# Patient Record
Sex: Male | Born: 1940 | Race: White | Hispanic: No | Marital: Married | Smoking: Former smoker
Health system: Southern US, Community
[De-identification: ages and names within clinical notes are randomized; demographics above are authoritative.]

## PROBLEM LIST (undated history)

## (undated) DIAGNOSIS — I639 Cerebral infarction, unspecified: Secondary | ICD-10-CM

## (undated) DIAGNOSIS — C439 Malignant melanoma of skin, unspecified: Secondary | ICD-10-CM

## (undated) DIAGNOSIS — G4730 Sleep apnea, unspecified: Secondary | ICD-10-CM

## (undated) DIAGNOSIS — E119 Type 2 diabetes mellitus without complications: Secondary | ICD-10-CM

## (undated) DIAGNOSIS — R519 Headache, unspecified: Secondary | ICD-10-CM

## (undated) DIAGNOSIS — R0600 Dyspnea, unspecified: Secondary | ICD-10-CM

## (undated) DIAGNOSIS — G8929 Other chronic pain: Secondary | ICD-10-CM

## (undated) DIAGNOSIS — L509 Urticaria, unspecified: Secondary | ICD-10-CM

## (undated) DIAGNOSIS — K219 Gastro-esophageal reflux disease without esophagitis: Secondary | ICD-10-CM

## (undated) DIAGNOSIS — D126 Benign neoplasm of colon, unspecified: Secondary | ICD-10-CM

## (undated) DIAGNOSIS — Z87442 Personal history of urinary calculi: Secondary | ICD-10-CM

## (undated) DIAGNOSIS — E785 Hyperlipidemia, unspecified: Secondary | ICD-10-CM

## (undated) DIAGNOSIS — R413 Other amnesia: Secondary | ICD-10-CM

## (undated) DIAGNOSIS — R51 Headache: Secondary | ICD-10-CM

## (undated) DIAGNOSIS — I2510 Atherosclerotic heart disease of native coronary artery without angina pectoris: Secondary | ICD-10-CM

## (undated) DIAGNOSIS — J449 Chronic obstructive pulmonary disease, unspecified: Secondary | ICD-10-CM

## (undated) DIAGNOSIS — K2270 Barrett's esophagus without dysplasia: Secondary | ICD-10-CM

## (undated) DIAGNOSIS — J398 Other specified diseases of upper respiratory tract: Secondary | ICD-10-CM

## (undated) DIAGNOSIS — I10 Essential (primary) hypertension: Secondary | ICD-10-CM

## (undated) DIAGNOSIS — R7303 Prediabetes: Secondary | ICD-10-CM

## (undated) DIAGNOSIS — Q631 Lobulated, fused and horseshoe kidney: Secondary | ICD-10-CM

## (undated) DIAGNOSIS — T8859XA Other complications of anesthesia, initial encounter: Secondary | ICD-10-CM

## (undated) DIAGNOSIS — K5732 Diverticulitis of large intestine without perforation or abscess without bleeding: Secondary | ICD-10-CM

## (undated) HISTORY — DX: Atherosclerotic heart disease of native coronary artery without angina pectoris: I25.10

## (undated) HISTORY — DX: Malignant melanoma of skin, unspecified: C43.9

## (undated) HISTORY — DX: Urticaria, unspecified: L50.9

## (undated) HISTORY — PX: CORONARY STENT PLACEMENT: SHX1402

## (undated) HISTORY — DX: Headache, unspecified: R51.9

## (undated) HISTORY — PX: CORONARY ARTERY BYPASS GRAFT: SHX141

## (undated) HISTORY — DX: Barrett's esophagus without dysplasia: K22.70

## (undated) HISTORY — DX: Other specified diseases of upper respiratory tract: J39.8

## (undated) HISTORY — DX: Hyperlipidemia, unspecified: E78.5

## (undated) HISTORY — DX: Chronic obstructive pulmonary disease, unspecified: J44.9

## (undated) HISTORY — PX: HERNIA REPAIR: SHX51

## (undated) HISTORY — PX: ADENOIDECTOMY: SUR15

## (undated) HISTORY — DX: Lobulated, fused and horseshoe kidney: Q63.1

## (undated) HISTORY — DX: Benign neoplasm of colon, unspecified: D12.6

## (undated) HISTORY — PX: UMBILICAL HERNIA REPAIR: SHX196

## (undated) HISTORY — DX: Other chronic pain: G89.29

## (undated) HISTORY — DX: Other amnesia: R41.3

## (undated) HISTORY — DX: Essential (primary) hypertension: I10

## (undated) HISTORY — PX: SINOSCOPY: SHX187

## (undated) HISTORY — DX: Type 2 diabetes mellitus without complications: E11.9

## (undated) HISTORY — DX: Gastro-esophageal reflux disease without esophagitis: K21.9

## (undated) HISTORY — PX: BRAIN SURGERY: SHX531

## (undated) HISTORY — DX: Personal history of urinary calculi: Z87.442

## (undated) HISTORY — PX: TONSILLECTOMY: SUR1361

## (undated) HISTORY — DX: Diverticulitis of large intestine without perforation or abscess without bleeding: K57.32

## (undated) HISTORY — DX: Headache: R51

---

## 2007-08-24 ENCOUNTER — Ambulatory Visit: Payer: Self-pay | Admitting: Internal Medicine

## 2007-09-08 ENCOUNTER — Encounter: Payer: Self-pay | Admitting: Internal Medicine

## 2007-09-08 ENCOUNTER — Ambulatory Visit: Payer: Self-pay | Admitting: Internal Medicine

## 2007-09-08 HISTORY — PX: COLONOSCOPY W/ BIOPSIES AND POLYPECTOMY: SHX1376

## 2007-09-15 ENCOUNTER — Ambulatory Visit: Payer: Self-pay | Admitting: Internal Medicine

## 2007-09-15 ENCOUNTER — Encounter: Payer: Self-pay | Admitting: Internal Medicine

## 2009-07-11 HISTORY — PX: CORONARY ARTERY BYPASS GRAFT: SHX141

## 2010-08-13 ENCOUNTER — Encounter (INDEPENDENT_AMBULATORY_CARE_PROVIDER_SITE_OTHER): Payer: Self-pay | Admitting: *Deleted

## 2010-08-24 ENCOUNTER — Encounter (INDEPENDENT_AMBULATORY_CARE_PROVIDER_SITE_OTHER): Payer: Self-pay | Admitting: *Deleted

## 2010-10-08 ENCOUNTER — Encounter (INDEPENDENT_AMBULATORY_CARE_PROVIDER_SITE_OTHER): Payer: Self-pay | Admitting: *Deleted

## 2010-10-08 ENCOUNTER — Ambulatory Visit: Payer: Self-pay | Admitting: Internal Medicine

## 2010-10-08 DIAGNOSIS — Z860100 Personal history of colon polyps, unspecified: Secondary | ICD-10-CM | POA: Insufficient documentation

## 2010-10-08 DIAGNOSIS — I259 Chronic ischemic heart disease, unspecified: Secondary | ICD-10-CM | POA: Insufficient documentation

## 2010-10-08 DIAGNOSIS — K2270 Barrett's esophagus without dysplasia: Secondary | ICD-10-CM | POA: Insufficient documentation

## 2010-10-12 ENCOUNTER — Ambulatory Visit: Payer: Self-pay | Admitting: Internal Medicine

## 2010-10-12 HISTORY — PX: UPPER GASTROINTESTINAL ENDOSCOPY: SHX188

## 2010-10-20 ENCOUNTER — Encounter: Payer: Self-pay | Admitting: Internal Medicine

## 2011-01-29 NOTE — Letter (Signed)
Summary: EGD Instructions  [facility]  [address] [place], [place] [address]   Phone: [phone]  Fax: [phone]       Angel Khan    04/11/1941    MRN: [id number]       Procedure Day [name]: [name], 10/11/10     Arrival Time: 3:00 PM     Procedure Time: 4:00 PM     Location of Procedure:                    _X_ [facility] (4th Floor)   PREPARATION FOR ENDOSCOPY   On THURSDAY, 10/11/10, THE DAY OF THE PROCEDURE:  1.   No solid foods, milk or milk products are allowed after midnight the night before your procedure.  2.   Do not drink anything colored red or purple.  Avoid juices with pulp.  No orange juice.  3.  You may drink clear liquids until2:00 PM, which is 2 hours before your procedure.                                                                                                CLEAR LIQUIDS INCLUDE: Water Jello Ice Popsicles Tea (sugar ok, no milk/cream) Powdered fruit flavored drinks Coffee (sugar ok, no milk/cream) Gatorade Juice: apple, white grape, white cranberry  Lemonade Clear bullion, consomm, broth Carbonated beverages (any kind) Strained chicken noodle soup Hard Candy   MEDICATION INSTRUCTIONS  Unless otherwise instructed, you should take regular prescription medications with a small sip of water as early as possible the morning of your procedure.  Stop taking Plavix or Aggrenox on  _  (7 days before procedure).   DO NOT STOP PLAVIX.                OTHER INSTRUCTIONS  You will need a responsible adult at least 69 years of age to accompany you and drive you home.   This person must remain in the waiting room during your procedure.  Wear loose fitting clothing that is easily removed.  Leave jewelry and other valuables at home.  However, you may wish to bring a book to read or an iPod/MP3 player to listen to music as you wait for your procedure to start.  Remove all body piercing jewelry and  leave at home.  Total time from sign-in until discharge is approximately 2-3 hours.  You should go home directly after your procedure and rest.  You can resume normal activities the day after your procedure.  The day of your procedure you should not:   Drive   Make legal decisions   Operate machinery   Drink alcohol   Return to work  You will receive specific instructions about eating, activities and medications before you leave.    The above instructions have been reviewed and explained to me by   _______________________    I fully understand and can verbalize these instructions _____________________________ Date _________

## 2011-01-29 NOTE — Procedures (Signed)
Summary: Colonoscopy: Adenoma   Colonoscopy  Procedure date:  09/08/2007  Findings:      Comments: 1) 3 DESCENDING COLON POLYPS REMOVED, MAX SIZE 6 MM 2) LEFT-SIDED DIVERTICULOSIS 3) EXTERNAL HEMORRHOIDS 4) EXCELLENT PREP  ***MICROSCOPIC EXAMINATION AND DIAGNOSIS***    COLON, SPLENIC FLEXURE AND DESCENDING POLYPS: THREE TUBULAR ADENOMAS. NO HIGH GRADE DYSPLASIA OR MALIGNANCY IDENTIFIED.  Procedures Next Due Date:    Colonoscopy: 08/2012  Patient Name: Angel Khan, Angel Khan. MRN:  Procedure Procedures: Colonoscopy CPT: [phone].    with polypectomy. CPT: A3573898.  Personnel: Endoscopist: Iva Boop, MD, [facility].  Exam Location: Exam performed in Outpatient Clinic. Outpatient  Patient Consent: Procedure, Alternatives, Risks and Benefits discussed, consent obtained, from patient. Consent was obtained by the RN.  Indications  Surveillance of: Adenomatous Polyp(s). This is not an initial surveillance exam. unknown Polyps were found at Index Exam. Largest polyp removed was unknown. Prior polyp located in unknown colon. Previous surveillance exam(s) in  2005,  Comments: 3 adenomatous polyps 2005 History  Current Medications: Patient is not currently taking Coumadin.  Allergies: Allergic to DEPAKOTE.  Pre-Exam Physical: Performed Sep 08, 2007. Cardio-pulmonary exam, Rectal exam, HEENT exam , Abdominal exam, Mental status exam WNL.  Comments: Pt. history reviewed/updated, physical exam performed prior to initiation of sedation? YES Prostate is slightly enlarged, no nodules Exam Exam: Extent of exam reached: Cecum, extent intended: Cecum.  The cecum was identified by appendiceal orifice and IC valve. Patient position: on left side. Time to Cecum: 00:03:25. Time for Withdrawl: 00:11:58. Colon retroflexion performed. Images taken. ASA Classification: II. Tolerance: excellent.  Monitoring: Pulse and BP monitoring, Oximetry used. Supplemental O2 given.  Colon Prep Used  MiraLax for colon prep. Prep results: excellent.  Sedation Meds: Patient assessed and found to be appropriate for moderate (conscious) sedation. Fentanyl 25 mcg. given IV. Versed 3 mg. given IV.  Findings - MULTIPLE POLYPS: Splenic Flexure to Descending Colon. minimum size 4 mm, maximum size 6 mm. Procedure:  snare without cautery, removed, Polyp retrieved, 3 polyps Polyps sent to pathology. Comments: 2 of 3 photographed.  - DIVERTICULOSIS: Descending Colon to Sigmoid Colon. Comments: moderate.  - NORMAL EXAM: Cecum to Transverse Colon.  HEMORRHOIDS: External. Size: Grade I.   Assessment  Comments: 1) 3 DESCENDING COLON POLYPS REMOVED, MAX SIZE 6 MM 2) LEFT-SIDED DIVERTICULOSIS 3) EXTERNAL HEMORRHOIDS 4) EXCELLENT PREP Events  Unplanned Interventions: No intervention was required.  Plans Patient Education: Patient given standard instructions for: Polyps. Diverticulosis. Hemorrhoids.  Disposition: After procedure patient sent to recovery. After recovery patient sent home.  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

## 2011-01-29 NOTE — Letter (Signed)
Summary: New Patient letter  [facility]  [address] [place], [place] [address]   Phone: [phone]  Fax: [phone]       08/24/2010 MRN: [id number]  [facility] [address] [place], [place]  [address]  Dear Mr. Hottenstein,  Welcome to the Gastroenterology Division at [facility].    You are scheduled to see Dr.  Stan Head on October 08, 2010 at 9:00am on the 3rd floor at [facility], [address]  We ask that you try to arrive at our office 15 minutes prior to your appointment time to allow for check-in.  We would like you to complete the enclosed self-administered evaluation form prior to your visit and bring it with you on the day of your appointment.  We will review it with you.  Also, please bring a complete list of all your medications or, if you prefer, bring the medication bottles and we will list them.  Please bring your insurance card so that we may make a copy of it.  If your insurance requires a referral to see a specialist, please bring your referral form from your primary care physician.  Co-payments are due at the time of your visit and may be paid by cash, check or credit card.     Your office visit will consist of a consult with your physician (includes a physical exam), any laboratory testing he/she may order, scheduling of any necessary diagnostic testing (e.g. x-ray, ultrasound, CT-scan), and scheduling of a procedure (e.g. Endoscopy, Colonoscopy) if required.  Please allow enough time on your schedule to allow for any/all of these possibilities.    If you cannot keep your appointment, please call [phone] to cancel or reschedule prior to your appointment date.  This allows [place] the opportunity to schedule an appointment for another patient in need of care.  If you do not cancel or reschedule by 5 p.m. the business day prior to your appointment date, you will be charged a $50.00 late cancellation/no-show fee.    Thank  you for choosing [facility] for your medical needs.  We appreciate the opportunity to care for you.  Please visit [place] at our website  to learn more about our practice.                     Sincerely,                                                             The Gastroenterology Division

## 2011-01-29 NOTE — Letter (Signed)
Summary: Endoscopy Letter  [facility]  [address] [place], [place] [address]   Phone: [phone]  Fax: [phone]      August 13, 2010 MRN: [id number]   [facility] [address] [place], [place]  [address]   Dear Mr. Dillen,   According to your medical record, it is time for you to schedule an Endoscopy. Endoscopic screening is recommended for patients with certain upper digestive tract conditions because of associated increased risk for cancers of the upper digestive system.  This letter has been generated based on the recommendations made at the time of your prior procedure. If you feel that in your particular situation this may no longer apply, please contact our office.  Please call our office at [phone]) to schedule this appointment or to update your records at your earliest convenience.  Thank you for cooperating with [place] to provide you with the very best care possible.   Sincerely,   Iva Boop, M.D.  [facility] [phone]

## 2011-01-29 NOTE — Procedures (Signed)
Summary: EGD: Barrett's   EGD  Procedure date:  09/15/2007  Findings:      Comments: 1) BARRETT'S ESOPHAGUS, 2 CM SEEN AND BIOPSIED. NO SUSPICIOUS FEATURES IDENTIFIED 2) OTHERWISE NORMAL  Procedures Next Due Date:    EGD: 09/2010   Patient Name: Angel Khan, Angel Khan. MRN:  Procedure Procedures: Panendoscopy (EGD) CPT: 43235.    with biopsy(s)/brushing(s). CPT: D1846139.  Personnel: Endoscopist: Iva Boop, MD, [facility].  Exam Location: Exam performed in Outpatient Clinic. Outpatient  Patient Consent: Procedure, Alternatives, Risks and Benefits discussed, consent obtained, from patient. Consent was obtained by the RN.  Indications  Surveillance of: Barrett's Esophagus. Last exam: Dec, 2004.  History  Current Medications: Patient is not currently taking Coumadin.  Allergies: Patient is allergic to DEPAKOTE.  Pre-Exam Physical: Performed Sep 15, 2007  Cardio-pulmonary exam, HEENT exam, Abdominal exam, Mental status exam WNL.  Comments: Pt. history reviewed/updated, physical exam performed prior to initiation of sedation? YES Exam Exam Info: Maximum depth of insertion Duodenum, intended Duodenum. Patient position: on left side. Gastric retroflexion performed. Images taken. ASA Classification: II. Tolerance: good.  Sedation Meds: Patient assessed and found to be appropriate for moderate (conscious) sedation. Fentanyl 50 mcg. given IV. Versed 6 mg. given IV. Cetacaine Spray 2 sprays given aerosolized.  Monitoring: BP and pulse monitoring done. Oximetry used. Supplemental O2 given  Findings - Normal: Proximal Esophagus to Distal Esophagus.  BARRETT'S ESOPHAGUS:  established. Proximal margin 36 cm from mouth,  Z Line 38 cm from mouth, Length of Barrett's 2 cm. No inflammation present. A retroflex image was taken. Biopsy/Barrett's taken. Path # 1. Comment: DISCONNECTED TONGUES AND ISLANDS, NO SUSPICIOUS CHANGES.  - Normal: Fundus to Duodenal 2nd Portion.    Assessment  Comments: 1) BARRETT'S ESOPHAGUS, 2 CM SEEN AND BIOPSIED. NO SUSPICIOUS FEATURES IDENTIFIED 2) OTHERWISE NORMAL Events  Unplanned Intervention: No unplanned interventions were required.  Plans Medication(s): Continue current medications.  Disposition: After procedure patient sent to recovery. After recovery patient sent home.  Scheduling: Path Letter, to The Patient,   CC:   Marvis Repress, MD  This report was created from the original endoscopy report, which was reviewed and signed by the above listed endoscopist.

## 2011-01-29 NOTE — Procedures (Signed)
Summary: Upper Endoscopy  Patient: Angel Khan Note: All result statuses are Final unless otherwise noted.  Tests: (1) Upper Endoscopy (EGD)   EGD Upper Endoscopy       DONE     [facility]     [address]     [place], [place]  [address]           ENDOSCOPY PROCEDURE REPORT           PATIENT:  Angel, Khan  MR#:  [id number]     BIRTHDATE:  [birth year], 68 yrs. old  GENDER:  male           ENDOSCOPIST:  Iva Boop, MD, [facility]     Referred by:           PROCEDURE DATE:  10/12/2010     PROCEDURE:  EGD with biopsy     ASA CLASS:  Class II     INDICATIONS:  follow-up of Barrett's Esophagus           MEDICATIONS:   Fentanyl 25 mcg IV, Versed 2 mg IV     TOPICAL ANESTHETIC:  Exactacain Spray           DESCRIPTION OF PROCEDURE:   After the risks benefits and     alternatives of the procedure were thoroughly explained, informed     consent was obtained.  The LB [id number] endoscope was     introduced through the mouth and advanced to the second portion of     the duodenum, without limitations.  The instrument was slowly     withdrawn as the mucosa was fully examined.     <<PROCEDUREIMAGES>>           Barrett's esophagus was found in the distal esophagus. 36-38 cm,     disconnected tongues and islands without nodules, ulcers or     inflammation. Multiple biopsies were obtained and sent to     pathology.  There were multiple polyps identified. in the body of     the stomach. Several diminutive, soft and fleshy polyps. Multiple     biopsies were obtained and sent to pathology.  Duodenitis was     found in the bulb of the duodenum. Red and slightly granular     mucosa.  Otherwise the examination was normal.    Retroflexed     views revealed Retroflexion exam demonstrated findings as     previously described.    The scope was then withdrawn from the     patient and the procedure completed.           COMPLICATIONS:  None           ENDOSCOPIC IMPRESSION:  1) Barrett's esophagus in the distal esophagus - 2 cm - biopsied           2) Polyps, multiple in the body of the stomach - look like     benign fundic gland polyps - biopsied     3) Duodenitis in the bulb of duodenum     4) Otherwise normal examination     RECOMMENDATIONS:     1) continue current medications - is on omeprazole 20 mg daily     with good control of GERD symptoms           REPEAT EXAM:  In for EGD, pending biopsy results.           Iva Boop, MD, [facility]  CC:  The Patient     Edgar Frisk, MD [facility])           n.     eSIGNED:   Iva Boop at 10/12/2010 12:10 PM           Angel, Khan, [id number]  Note: An exclamation mark (!) indicates a result that was not dispersed into the flowsheet. Document Creation Date: 10/12/2010 12:11 PM _______________________________________________________________________  (1) Order result status: Final Collection or observation date-time: 10/12/2010 11:58 Requested date-time:  Receipt date-time:  Reported date-time:  Referring Physician:   Ordering Physician: Stan Head [phone]) Specimen Source:  Source: Launa Grill Order Number: [id number] Lab site:   Appended Document: Upper Endoscopy   EGD  Procedure date:  10/12/2010  Findings:         1) Barrett's esophagus in the distal esophagus - 2 cm - biopsied      NO DYSPLASIA, + INTESTINAL METAPLASIA     2) Polyps, multiple in the body of the stomach - look like     benign fundic gland polyps - biopsied FUNDIC GLAND POLYPS     3) Duodenitis in the bulb of duodenum     4) Otherwise normal examination  Procedures Next Due Date:    EGD: 09/2013

## 2011-01-29 NOTE — Assessment & Plan Note (Signed)
Summary: CONSULT BEFORE EGD PT ON PLAVIX.Angel KitchenMarland KitchenEM   History of Present Illness Visit Type: Initial Visit Primary GI MD: Stan Head MD [facility] Primary Provider: [facility] Chief Complaint: EGD- patient on BT History of Present Illness:   Last seen in September 2008 (EGD and Colonoscopy) Had been on Nexium 40 mg daily last, then went to 20 mg omeprazole two times a day due to cost. Then went to 20 mg daily in evening (is also taking Plavix then) as directed by [facility]). Rare heartburn , few times a month and sometimes takes an omeprazole. Bloating is mild.    GI Review of Systems    Reports acid reflux and  bloating.      Denies abdominal pain, belching, chest pain, dysphagia with liquids, dysphagia with solids, heartburn, loss of appetite, nausea, vomiting, vomiting blood, weight loss, and  weight gain.      Reports diverticulosis and  hemorrhoids.     Denies anal fissure, black tarry stools, change in bowel habit, constipation, diarrhea, fecal incontinence, heme positive stool, irritable bowel syndrome, jaundice, light color stool, liver problems, rectal bleeding, and  rectal pain. Preventive Screening-Counseling & Management  Alcohol-Tobacco     Alcohol drinks/day: 0     Alcohol Counseling: not indicated; patient does not drink     Smoking Status: never     Smoking Cessation Counseling: no     Tobacco Counseling: not indicated; no tobacco use  Caffeine-Diet-Exercise     Caffeine use/day: 3-5     Caffeine Counseling: decrease use of caffeine     Does Patient Exercise: yes     Type of exercise: gardening/yard work  Comments: advised to lose weight and definitely not to gain      Drug Use:  no.      Clinical Reports Reviewed:  EGD:  09/15/2007:  Comments: 1) BARRETT'S ESOPHAGUS, 2 CM SEEN AND BIOPSIED. NO SUSPICIOUS FEATURES IDENTIFIED 2) OTHERWISE NORMAL  Colonoscopy:  09/08/2007:  Comments: 1) 3 DESCENDING COLON POLYPS REMOVED, MAX  SIZE 6 MM 2) LEFT-SIDED DIVERTICULOSIS 3) EXTERNAL HEMORRHOIDS 4) EXCELLENT PREP  ***MICROSCOPIC EXAMINATION AND DIAGNOSIS***    COLON, SPLENIC FLEXURE AND DESCENDING POLYPS: THREE TUBULAR ADENOMAS. NO HIGH GRADE DYSPLASIA OR MALIGNANCY IDENTIFIED.   Current Medications (verified): 1)  Plavix 75 Mg Tabs (Clopidogrel Bisulfate) .... Once Daily 2)  Simvastatin 40 Mg Tabs (Simvastatin) .... At Bedtime 3)  Zetia 10 Mg Tabs (Ezetimibe) .... Take 1/2 Tablet  Every Morning 4)  Sertraline Hcl 25 Mg Tabs (Sertraline Hcl) .... At Bedtime 5)  Metoprolol Tartrate 25 Mg Tabs (Metoprolol Tartrate) .... Take 1/2 Tablet Two Times A Day 6)  Aspirin 325 Mg Tabs (Aspirin) .... Once Daily 7)  Omeprazole 20 Mg Cpdr (Omeprazole) .Angel Khan.. 1 By Mouth Once Daily  Allergies (verified): 1)  ! Depakote  Past History:  Past Medical History: Barretts Esophagus Chronic Headaches Adenomatous Colon Polyps Diverticulosis GERD Hemorrhoids Kidney Stones Coronary Artery Disease - 5 stents Marcha Solders, MD [facility])  Past Surgical History: CABG Hernia Surgery Tonsillectomy  Social History: Married, 3 Scientist, forensic - retired from [employer] Patient has never smoked.  Alcohol Use - no Daily Caffeine Use Illicit Drug Use - no Smoking Status:  never Drug Use:  no Caffeine use/day:  3-5 Does Patient Exercise:  yes Alcohol drinks/day:  0  Review of Systems       The patient complains of allergy/sinus, arthritis/joint pain, change in vision, depression-new, fatigue, and shortness of breath.  All other ROS negative except as per HPI.   Vital Signs:  Patient profile:   70 year old male Height:      65 inches Weight:      190.38 pounds BMI:     31.80 Pulse rate:   80 / minute Pulse rhythm:   regular BP sitting:   126 / 70  (left arm) Cuff size:   regular  Vitals Entered By: June McMurray CMA Duncan Dull) (October 08, 2010 8:51 AM)  Physical Exam  General:  Well developed, well nourished,  no acute distress. Mildly obese Eyes:  anicteric Mouth:  clear oral and posterior pharynx Lungs:  Clear throughout to auscultation. Heart:  Regular rate and rhythm; no murmurs, rubs,  or bruits. Abdomen:  mildly obese, soft and non-tender without HSM/mass Psych:  Alert and cooperative. Normal mood and affect.   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) Assessment Unchanged  due for surveillance EGD some mild sxs on omeprazole 20 mg once daily depending on EGD may need a dose adjustment'not clear why told to tale PPI in evening unless was related to Plavix but taking at same time EGD with bx low-risk for bleeding on Plavix so continue that for EGD  Orders: EGD (EGD)  Problem # 2:  CORONARY ARTERY DISEASE, S/P PTCA (ICD-414.9) Assessment: Unchanged 5 stents no sxs  Problem # 3:  COLONIC POLYPS, ADENOMATOUS, HX OF (ICD-V12.72) Assessment: Unchanged due 2013 for surveillance/screening  Patient Instructions: 1)  We will see you at your procedure on 10/11/10. 2)  [facility] Patient Information Guide given to patient.  3)  Upper Endoscopy brochure given.  4)  Copy sent to : [facility] 5)  The medication list was reviewed and reconciled.  All changed / newly prescribed medications were explained.  A complete medication list was provided to the patient / caregiver.

## 2011-01-29 NOTE — Letter (Signed)
Summary: Patient Notice-Barrett's Esopghagus  [facility]  [address]   [place], [place] [address]   Phone: [phone]  Fax: [phone]        October 20, 2010 MRN: [id number]    [facility] [address] [place], [place]  [address]    Dear Angel Khan,  I am pleased to inform you that the biopsies taken during your recent endoscopic examination did not show any evidence of cancer upon pathologic examination.  However, your biopsies still indicate you have a condition known as Barrett's esophagus. While not cancer, it is pre-cancerous (can progress to cancer) and needs to be monitored with repeat endoscopic examination and biopsies.  Fortunately, it is quite rare that this develops into cancer, but careful monitoring of the condition along with taking your medication as prescribed is important in reducing the risk of developing cancer.  It is my recommendation that you have a repeat upper gastrointestinal endoscopic examination in 3-5 years. I plan to see you in the office in 3 years to decide upon the exact timing of the next endoscopy  You should remain on omeprazole and keep your reflux symptoms under control. Your primary care physician should be able to refill the omeprazole.  Please call us if you have or develop heartburn, reflux symptoms, any swallowing problems, or if you have questions about your condition that have not been fully answered at this time.    Sincerely,  Angel Boop MD, [facility]  This letter has been electronically signed by your physician.  Appended Document: Patient Notice-Barrett's Esopghagus letter mailed

## 2011-03-14 LAB — GLUCOSE, CAPILLARY: Glucose-Capillary: 98 mg/dL (ref 70–99)

## 2011-11-30 DIAGNOSIS — K5732 Diverticulitis of large intestine without perforation or abscess without bleeding: Secondary | ICD-10-CM

## 2011-11-30 HISTORY — DX: Diverticulitis of large intestine without perforation or abscess without bleeding: K57.32

## 2011-12-11 LAB — POCT CBC W AUTO DIFF (K'VILLE URGENT CARE)

## 2012-01-09 ENCOUNTER — Telehealth: Payer: Self-pay | Admitting: Internal Medicine

## 2012-01-09 ENCOUNTER — Encounter: Payer: Self-pay | Admitting: Internal Medicine

## 2012-01-09 ENCOUNTER — Ambulatory Visit: Payer: Self-pay | Admitting: Internal Medicine

## 2012-01-09 NOTE — Telephone Encounter (Signed)
Patient's wife informed of Dr. Marvell Fuller suggestions.  Procedure and Pathology reports routed to Dr. Candie Echevaria.

## 2012-01-09 NOTE — Telephone Encounter (Signed)
Patient's wife called to cancel his appointment thought appointment was tomorrow, did not want to reschedule. Do you want to charge?

## 2012-01-09 NOTE — Telephone Encounter (Signed)
That the patient know I reviewed his records. As long as he is recovered I don't necessarily need to see him. We should be contacting him about a colonoscopy later on this year, around September or October.  Please send copies of his colonoscopy, endoscopy and pathology reports to his primary care physician Dr. Kirke Corin

## 2012-08-03 ENCOUNTER — Encounter: Payer: Self-pay | Admitting: Internal Medicine

## 2012-12-28 ENCOUNTER — Ambulatory Visit (INDEPENDENT_AMBULATORY_CARE_PROVIDER_SITE_OTHER): Payer: Medicare Other | Admitting: Internal Medicine

## 2012-12-28 ENCOUNTER — Encounter: Payer: Self-pay | Admitting: Internal Medicine

## 2012-12-28 VITALS — BP 152/70 | HR 88 | Ht 65.25 in | Wt 189.2 lb

## 2012-12-28 DIAGNOSIS — K2210 Ulcer of esophagus without bleeding: Secondary | ICD-10-CM

## 2012-12-28 DIAGNOSIS — Z86010 Personal history of colonic polyps: Secondary | ICD-10-CM

## 2012-12-28 DIAGNOSIS — I2510 Atherosclerotic heart disease of native coronary artery without angina pectoris: Secondary | ICD-10-CM

## 2012-12-28 DIAGNOSIS — K2270 Barrett's esophagus without dysplasia: Secondary | ICD-10-CM

## 2012-12-28 DIAGNOSIS — Z9861 Coronary angioplasty status: Secondary | ICD-10-CM

## 2012-12-28 MED ORDER — SOD PICOSULFATE-MAG OX-CIT ACD 10-3.5-12 MG-GM-GM PO PACK: 1.0000 | PACK | Freq: Once | ORAL | Status: DC

## 2012-12-28 NOTE — Patient Instructions (Addendum)
You have been scheduled for a colonoscopy with propofol. Please follow written instructions given to you at your visit today.  Please use the prep kit you have been given today. If you use inhalers (even only as needed) or a CPAP machine, please bring them with you on the day of your procedure.  You will be contacted by our office prior to your procedure for directions on holding your Plavix.  If you do not hear from our office 1 week prior to your scheduled procedure, please call [phone] to discuss.   You may stay on your ASA for your procedure.  We are putting in a recall for your EGD to 09/2015.  Thank you for choosing me and [facility].  Iva Boop, M.D., [facility]

## 2012-12-28 NOTE — Progress Notes (Signed)
Subjective:    Patient ID: Angel Khan, male    DOB: February 07, 1941, 71 y.o.   MRN: [id number]  HPI The patient presents in followup, he has a history of Barrett's esophagus as well as a history of 3 adenomatous colon polyps removed in 2008. He has no dysphagia or heartburn he is faithfully taking his omeprazole. He is not having any lower GI complaints either.  Allergies  Allergen Reactions  . Depakote (Divalproex Sodium)     Hair loss    Outpatient Prescriptions Prior to Visit  Medication Sig Dispense Refill  . aspirin 325 MG tablet Take 325 mg by mouth daily.        . clopidogrel (PLAVIX) 75 MG tablet Take 75 mg by mouth daily.        [name] ezetimibe (ZETIA) 10 MG tablet Take 5 mg by mouth every morning.        . fluticasone (FLONASE) 50 MCG/ACT nasal spray Place 2 sprays into the nose daily.        . Magnesium 250 MG TABS Take 1 tablet by mouth daily.        . Melatonin CR (MELADOX) 3 MG TBCR Take 1 capsule by mouth daily as needed.        . metFORMIN (GLUCOPHAGE) 500 MG tablet Take 250 mg by mouth daily with breakfast.       . Omega-3 Fatty Acids (FISH OIL) 1000 MG CAPS Take 3 capsules by mouth daily.        [name] omeprazole (PRILOSEC) 20 MG capsule Take 20 mg by mouth daily.        . sertraline (ZOLOFT) 25 MG tablet Take 25 mg by mouth at bedtime.        . simvastatin (ZOCOR) 80 MG tablet Take 40 mg by mouth daily.        .      .      .      .      .      .      .      .       Last reviewed on 12/28/2012  3:01 PM by Iva Boop, MD Past Medical History  Diagnosis Date  . Barrett esophagus   . Chronic headaches   . Adenomatous colon polyp   . GERD (gastroesophageal reflux disease)   . Hemorrhoids   . History of kidney stones   . CAD (coronary artery disease)   . DM (diabetes mellitus)   . COPD (chronic obstructive pulmonary disease)   . Coronary atherosclerosis   . HTN (hypertension)   . HLD (hyperlipidemia)   . Horseshoe kidney   . Diverticulitis of colon  December 2012    Diagnosed by CT scan [facility]   Past Surgical History  Procedure Date  . Coronary artery bypass graft   . Umbilical hernia repair   . Tonsillectomy   . Colonoscopy w/ biopsies and polypectomy 09/08/2007    adenomatous polyps, diverticulosis, external hemorrhoids  . Upper gastrointestinal endoscopy 10/12/2010    barrett's, fondic gland polyps, duodenitis  . Coronary stent placement     x 5    Review of Systems No chest pain/angina but does have chronic chest wall pain s/p CABG    Objective:   Physical Exam General:  NAD Eyes:   anicteric Lungs:  clear Heart:  S1S2 no rubs, murmurs or gallops Abdomen:  soft and nontender, BS+     Data Reviewed: 2008  colonoscopy and pathology, 2011 EGD and pathology    Assessment & Plan:   1. Personal history of colonic polyps - adenomas (3) in 2008 - due for surveillance/screening colonoscopy  2. Barrett's esophagus - stable no sxs and no prior dysplasia  3. CAD S/P percutaneous coronary angioplasty  - on clopidogrel and ASA   1. Schedule colonoscopy, Prep-o-Pik prep. Needs to be off clopidogrel for 5-7 days to reduce bleeding risk from polypectomy, etc. The risks and benefits as well as alternatives of endoscopic procedure(s) have been discussed and reviewed. All questions answered. The patient agrees to proceed. He understands risk of stent closure off clopidogrel. Continue PPI for Barret's, repeat EGD 09/2015, approximately 5 years after last  Cc: Dr. Marcha Solders (cardiologist) and [facility]

## 2013-01-01 HISTORY — PX: CARDIAC CATHETERIZATION: SHX172

## 2013-01-12 HISTORY — PX: CHOLECYSTECTOMY: SHX55

## 2013-01-25 ENCOUNTER — Encounter: Payer: Self-pay | Admitting: Internal Medicine

## 2013-01-25 NOTE — Progress Notes (Signed)
Patient ID: Angel Khan, male   DOB: 1941-07-29, 72 y.o.   MRN: [id number] Faxed anti-coag letter faxed over to Dr. Marcha Solders.  Originally sent 12/28/13 and have not heard back.  Colonoscopy appointment 02/04/2013.

## 2013-01-27 ENCOUNTER — Telehealth: Payer: Self-pay | Admitting: Internal Medicine

## 2013-01-27 NOTE — Telephone Encounter (Signed)
Patient recently admitted to [facility].  He had a cardiac cath for chest pain and EGD prior to lap chole 01/12/13.  Care everywhere does not reflect any of this.  I have contacted [facility] and they will send me the records for admission 1/13- 1/15 via fax.  Patient is scheduled for colon on 02/04/13.  Should he proceed with colon? Do we need to postpone?  Have not heard from Dr. Lorenso Courier about taking him off of his Plavix.  Please advise

## 2013-01-27 NOTE — Telephone Encounter (Signed)
Records reviewed  I think he should wait until March to do the colonoscopy - let him recover some more though not wrong to proceed if he really wants to We still need to get an ok about holding Plavix

## 2013-01-27 NOTE — Telephone Encounter (Signed)
I at least need to review the records Let's regroup Friday 1/31 and decide My guess is he was ok since they did the chole However - we do need to find out re: holding Plavix x 5 days If cannot get the answer in time would need to postpone

## 2013-01-27 NOTE — Telephone Encounter (Signed)
The records are on your desk Sir.

## 2013-01-28 NOTE — Telephone Encounter (Signed)
Spoke to patient and let him know what your recommendations are.  Then we canceled colonoscopy for 02/04/13 and set up pre-visit and colonoscopy appointments for March 2014.

## 2013-02-04 ENCOUNTER — Encounter: Payer: Medicare Other | Admitting: Internal Medicine

## 2013-03-04 ENCOUNTER — Ambulatory Visit (AMBULATORY_SURGERY_CENTER): Payer: Medicare Other | Admitting: *Deleted

## 2013-03-04 VITALS — Ht 65.5 in | Wt 188.6 lb

## 2013-03-04 DIAGNOSIS — Z1211 Encounter for screening for malignant neoplasm of colon: Secondary | ICD-10-CM

## 2013-03-15 ENCOUNTER — Telehealth: Payer: Self-pay | Admitting: Internal Medicine

## 2013-03-15 NOTE — Telephone Encounter (Signed)
Patient advised that we will need to reschedule the procedure.  He is rescheduled to 03/23/13.  He will take his last dose of Plavix on 03/17/13 (see correspondence under media tab dated 02/03/13)

## 2013-03-18 ENCOUNTER — Encounter: Payer: Medicare Other | Admitting: Internal Medicine

## 2013-03-23 ENCOUNTER — Encounter: Payer: Self-pay | Admitting: Internal Medicine

## 2013-03-23 ENCOUNTER — Other Ambulatory Visit: Payer: Self-pay | Admitting: Internal Medicine

## 2013-03-23 ENCOUNTER — Ambulatory Visit (AMBULATORY_SURGERY_CENTER): Payer: Medicare Other | Admitting: Internal Medicine

## 2013-03-23 VITALS — BP 124/73 | HR 76 | Temp 97.6°F | Resp 16 | Ht 65.0 in | Wt 188.0 lb

## 2013-03-23 DIAGNOSIS — K5730 Diverticulosis of large intestine without perforation or abscess without bleeding: Secondary | ICD-10-CM

## 2013-03-23 DIAGNOSIS — Z86010 Personal history of colonic polyps: Secondary | ICD-10-CM

## 2013-03-23 DIAGNOSIS — D126 Benign neoplasm of colon, unspecified: Secondary | ICD-10-CM

## 2013-03-23 DIAGNOSIS — Z1211 Encounter for screening for malignant neoplasm of colon: Secondary | ICD-10-CM

## 2013-03-23 DIAGNOSIS — K644 Residual hemorrhoidal skin tags: Secondary | ICD-10-CM

## 2013-03-23 DIAGNOSIS — K648 Other hemorrhoids: Secondary | ICD-10-CM

## 2013-03-23 MED ORDER — SODIUM CHLORIDE 0.9 % IV SOLN: 500.0000 mL | INTRAVENOUS | Status: DC

## 2013-03-23 NOTE — Progress Notes (Signed)
Pt is not taking metformin now.  He stopped taking if himself a couple of weeks ago per the pt.  He said he took his blood sugar at home this am and it was 95.  I recheck his blood sugar in the recovery room and it was 79.  The pt did not have any symptoms.  He said "I feel fine".  Once he was a little more awake I gave him orange juice to drink.  I advised him to talk to his doctor who is controlling his blood sugar about stopping the metformin so they can be on the same page.  The pt said he would.  I also advised him to restart his plavix today.  He and his daughter said they understood. Maw  No complaints noted in the recovery room. Maw   Patient did not experience any of the following events: a burn prior to discharge; a fall within the facility; wrong site/side/patient/procedure/implant event; or a hospital transfer or hospital admission upon discharge from the facility. (G8907)Patient did not have preoperative order for IV antibiotic SSI prophylaxis. [phone])

## 2013-03-23 NOTE — Op Note (Signed)
[facility] [address] [place], [address]   COLONOSCOPY PROCEDURE REPORT  PATIENT: Angel, Khan  MR#: [id number] BIRTHDATE: 11-04-1941 , 71  yrs. old GENDER: Male ENDOSCOPIST: Iva Boop, MD, [facility] PROCEDURE DATE:  03/23/2013 PROCEDURE:   Colonoscopy with biopsy ASA CLASS:   Class III INDICATIONS:Screening and surveillance,personal history of colonic polyps. MEDICATIONS: propofol (Diprivan) 150mg  IV, MAC sedation, administered by CRNA, and These medications were titrated to patient response per physician's verbal order  DESCRIPTION OF PROCEDURE:   After the risks benefits and alternatives of the procedure were thoroughly explained, informed consent was obtained.  A digital rectal exam revealed no abnormalities of the rectum, A digital rectal exam revealed no prostatic nodules, and A digital rectal exam revealed the prostate was not enlarged.   The LB [id number]  endoscope was introduced through the anus and advanced to the cecum, which was identified by both the appendix and ileocecal valve. No adverse events experienced.   The quality of the prep was Suprep good  The instrument was then slowly withdrawn as the colon was fully examined.      COLON FINDINGS: Two diminutive smooth and polypoid shaped sessile polyps were found in the transverse colon.  A polypectomy was performed with cold forceps.  The resection was complete and the polyp tissue was completely retrieved.   Moderate diverticulosis was noted in the sigmoid colon.   Small internal and external hemorrhoids were found.   The colon mucosa was otherwise normal. Retroflexed views revealed internal/external hemorrhoids. The time to cecum=3 minutes 03 seconds.  Withdrawal time=12 minutes 34 seconds.  The scope was withdrawn and the procedure completed. COMPLICATIONS: There were no complications.  ENDOSCOPIC IMPRESSION: 1.   Two diminutive sessile polyps were found in the  transverse colon; polypectomy was performed with cold forceps 2.   Moderate diverticulosis was noted in the sigmoid colon 3.   Small internal and external hemorrhoids 4.   The colon mucosa was otherwise normal - good prep  RECOMMENDATIONS: Timing of repeat colonoscopy will be determined by pathology findings in patient w/ 3 adenomas removed 2008   eSigned:  Iva Boop, MD, [facility] 03/23/2013 2:54 PM  cc: The Patient and Rodena Medin, MD

## 2013-03-23 NOTE — Patient Instructions (Addendum)
Two very small polyps were removed. Do not worry - they look benign.  You also have diverticulosis and hemorrhoids - not usually a problem.  I will let you know pathology results and when to have another routine colonoscopy by mail.  Resume all medications - including Plavix (clopidogrel)  Thank you for choosing me and [facility].  Iva Boop, MD, FACG    YOU HAD AN ENDOSCOPIC PROCEDURE TODAY AT THE [facility]: Refer to the procedure report that was given to you for any specific questions about what was found during the examination.  If the procedure report does not answer your questions, please call your gastroenterologist to clarify.  If you requested that your care partner not be given the details of your procedure findings, then the procedure report has been included in a sealed envelope for you to review at your convenience later.  YOU SHOULD EXPECT: Some feelings of bloating in the abdomen. Passage of more gas than usual.  Walking can help get rid of the air that was put into your GI tract during the procedure and reduce the bloating. If you had a lower endoscopy (such as a colonoscopy or flexible sigmoidoscopy) you may notice spotting of blood in your stool or on the toilet paper. If you underwent a bowel prep for your procedure, then you may not have a normal bowel movement for a few days.  DIET: Your first meal following the procedure should be a light meal and then it is ok to progress to your normal diet.  A half-sandwich or bowl of soup is an example of a good first meal.  Heavy or fried foods are harder to digest and may make you feel nauseous or bloated.  Likewise meals heavy in dairy and vegetables can cause extra gas to form and this can also increase the bloating.  Drink plenty of fluids but you should avoid alcoholic beverages for 24 hours.  ACTIVITY: Your care partner should take you home directly after the procedure.  You should plan to take it  easy, moving slowly for the rest of the day.  You can resume normal activity the day after the procedure however you should NOT DRIVE or use heavy machinery for 24 hours (because of the sedation medicines used during the test).    SYMPTOMS TO REPORT IMMEDIATELY: A gastroenterologist can be reached at any hour.  During normal business hours, 8:30 AM to 5:00 PM Monday through Friday, call [phone].  After hours and on weekends, please call the GI answering service at [phone] who will take a message and have the physician on call contact you.   Following lower endoscopy (colonoscopy or flexible sigmoidoscopy):  Excessive amounts of blood in the stool  Significant tenderness or worsening of abdominal pains  Swelling of the abdomen that is new, acute  Fever of 100F or higher    FOLLOW UP: If any biopsies were taken you will be contacted by phone or by letter within the next 1-3 weeks.  Call your gastroenterologist if you have not heard about the biopsies in 3 weeks.  Our staff will call the home number listed on your records the next business day following your procedure to check on you and address any questions or concerns that you may have at that time regarding the information given to you following your procedure. This is a courtesy call and so if there is no answer at the home number and we have not heard from you  through the emergency physician on call, we will assume that you have returned to your regular daily activities without incident.  SIGNATURES/CONFIDENTIALITY: You and/or your care partner have signed paperwork which will be entered into your electronic medical record.  These signatures attest to the fact that that the information above on your After Visit Summary has been reviewed and is understood.  Full responsibility of the confidentiality of this discharge information lies with you and/or your care-partner.

## 2013-03-23 NOTE — Progress Notes (Signed)
Lidocaine-40mg IV prior to Propofol InductionPropofol given over incremental dosages 

## 2013-03-24 ENCOUNTER — Telehealth: Payer: Self-pay

## 2013-03-24 NOTE — Telephone Encounter (Signed)
  Follow up Call-  Call back number 03/23/2013  Permission to leave phone message Yes     Patient questions:  Do you have a fever, pain , or abdominal swelling? no Pain Score  0 *  Have you tolerated food without any problems? yes  Have you been able to return to your normal activities? yes  Do you have any questions about your discharge instructions: Diet   no Medications  no Follow up visit  no  Do you have questions or concerns about your Care? no  Actions: * If pain score is 4 or above: No action needed, pain <4.

## 2013-03-31 ENCOUNTER — Encounter: Payer: Self-pay | Admitting: Internal Medicine

## 2013-03-31 NOTE — Progress Notes (Signed)
Quick Note:  Diminutive tubular adenoma and diminutive sessile serrated adenoma Repeat colonoscopy about 02/2018 ______

## 2013-08-06 ENCOUNTER — Telehealth: Payer: Self-pay | Admitting: Internal Medicine

## 2013-08-06 NOTE — Telephone Encounter (Signed)
Spoke with patient and told him Dr. Leone Payor has signed off on the note on 07/20/13 that was faxed.  Patient wants to know if Dr. Leone Payor thinks anything needs to be done based on this note. Patient is aware Dr. Leone Payor is out of office until next week. Please, advise.

## 2013-08-09 MED ORDER — PANTOPRAZOLE SODIUM 40 MG PO TBEC: 40.0000 mg | DELAYED_RELEASE_TABLET | Freq: Every day | ORAL | Status: DC

## 2013-08-09 NOTE — Telephone Encounter (Signed)
The possible interactions between omeprazole and clopidogrel are questionable  We can switch him to pantoprazole 40 mg daily or lansoprazole 30 mg daily if on formulary - these are not thought to have the interactions suspected with omeprazole

## 2013-08-09 NOTE — Telephone Encounter (Signed)
Left message for patient to call back  

## 2013-08-09 NOTE — Telephone Encounter (Signed)
Patient aware.  90 day supply  pantoprazole to [facility]

## 2013-08-09 NOTE — Telephone Encounter (Signed)
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  I do not think that he needs any other testing by me at this time  - if he is having swallowing problems or heartburn sxs then let me know. Otherwise would repeat EGD in 2016 as planned.

## 2013-08-09 NOTE — Telephone Encounter (Signed)
Patient states that his ENT increased his omeprazole to 40 mg and he is worried because his heart MD told him that he should not take more than 20 mg a day.  I advised him that he should take Plavix and omeprazole 12 hours apart.  Dr. Leone Payor do you have any other suggestions?

## 2013-11-30 ENCOUNTER — Telehealth: Payer: Self-pay

## 2013-11-30 ENCOUNTER — Encounter: Payer: Self-pay | Admitting: Internal Medicine

## 2013-11-30 ENCOUNTER — Ambulatory Visit (INDEPENDENT_AMBULATORY_CARE_PROVIDER_SITE_OTHER): Payer: Medicare Other | Admitting: Internal Medicine

## 2013-11-30 VITALS — BP 134/70 | HR 72 | Ht 65.0 in | Wt 186.4 lb

## 2013-11-30 DIAGNOSIS — R1314 Dysphagia, pharyngoesophageal phase: Secondary | ICD-10-CM

## 2013-11-30 DIAGNOSIS — K648 Other hemorrhoids: Secondary | ICD-10-CM | POA: Insufficient documentation

## 2013-11-30 DIAGNOSIS — R1310 Dysphagia, unspecified: Secondary | ICD-10-CM

## 2013-11-30 DIAGNOSIS — R1319 Other dysphagia: Secondary | ICD-10-CM | POA: Insufficient documentation

## 2013-11-30 DIAGNOSIS — I259 Chronic ischemic heart disease, unspecified: Secondary | ICD-10-CM
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
## 2013-11-30 NOTE — Telephone Encounter (Signed)
  11/30/2013   RE: PETRA SARGEANT DOB: 1941/06/09 MRN: [id number]   Dear Marcha Solders M.D.,    We have scheduled the above patient for an endoscopic procedure. Our records show that he is on anticoagulation therapy.   Please advise as to how long the patient may come off his therapy of Plavix prior to the EGD/Dil procedure, which is scheduled for 12/13/13.  Please fax back/ or route the completed form to Tanish Prien [place], CMA (AAMA) at [phone].   Sincerely,    [place], Clayborne Dana

## 2013-11-30 NOTE — Assessment & Plan Note (Signed)
He has indwelling stents, I believe there are drug-eluting, needs to hold clopidogrel prior to EGD with dilation. Has been able to do this for other procedures, will check with cardiology.

## 2013-11-30 NOTE — Patient Instructions (Signed)
You have been scheduled for an endoscopy with propofol. Please follow written instructions given to you at your visit today. If you use inhalers (even only as needed), please bring them with you on the day of your procedure. Your physician has requested that you go to www.startemmi.com and enter the access code given to you at your visit today. This web site gives a general overview about your procedure. However, you should still follow specific instructions given to you by our office regarding your preparation for the procedure.  You will be contaced by our office prior to your procedure for directions on holding your plavix.  If you do not hear from our office 1 week prior to your scheduled procedure, please call [phone] to discuss.  I appreciate the opportunity to care for you.

## 2013-11-30 NOTE — Assessment & Plan Note (Signed)
Plan to investigate with upper GI endoscopy and possible esophageal dilation.The risks and benefits as well as alternatives of endoscopic procedure(s) have been discussed and reviewed. All questions answered. The patient agrees to proceed. Increased risk of cardiac complications off clopidogrel mentioned to the patient but increased risk of bleeding on it so would prefer to perform this procedure off clopidogrel and will check with cardiology.

## 2013-11-30 NOTE — Assessment & Plan Note (Signed)
Week and consider hemorrhoidal ligation but he would need to be off clopidogrel for up to 2 weeks I think. Is not really symptomatic at this time so will discuss later.

## 2013-12-01 NOTE — Telephone Encounter (Signed)
Patient informed that per Dr. Marcha Solders he may hold his Plavix 5 days prior to his procedure, he verbalized understanding and the response will be scanned into epic.

## 2013-12-13 ENCOUNTER — Encounter: Payer: Medicare Other | Admitting: Internal Medicine

## 2013-12-13 ENCOUNTER — Telehealth: Payer: Self-pay | Admitting: Internal Medicine

## 2013-12-15 ENCOUNTER — Telehealth: Payer: Self-pay

## 2013-12-15 ENCOUNTER — Ambulatory Visit (AMBULATORY_SURGERY_CENTER): Payer: Medicare Other | Admitting: Internal Medicine

## 2013-12-15 ENCOUNTER — Encounter: Payer: Self-pay | Admitting: Internal Medicine

## 2013-12-15 VITALS — BP 139/89 | HR 89 | Temp 97.9°F | Resp 17 | Ht 65.0 in | Wt 186.0 lb

## 2013-12-15 DIAGNOSIS — R1314 Dysphagia, pharyngoesophageal phase: Secondary | ICD-10-CM

## 2013-12-15 DIAGNOSIS — R05 Cough: Secondary | ICD-10-CM

## 2013-12-15 DIAGNOSIS — K2270 Barrett's esophagus without dysplasia: Secondary | ICD-10-CM

## 2013-12-15 DIAGNOSIS — R059 Cough, unspecified: Secondary | ICD-10-CM

## 2013-12-15 DIAGNOSIS — R0600 Dyspnea, unspecified: Secondary | ICD-10-CM

## 2013-12-15 DIAGNOSIS — J449 Chronic obstructive pulmonary disease, unspecified: Secondary | ICD-10-CM

## 2013-12-15 MED ORDER — SODIUM CHLORIDE 0.9 % IV SOLN: 500.0000 mL | INTRAVENOUS | Status: DC

## 2013-12-15 NOTE — Progress Notes (Signed)
Called to room to assist during endoscopic procedure.  Patient ID and intended procedure confirmed with present staff. Received instructions for my participation in the procedure from the performing physician. [email or web address] 

## 2013-12-15 NOTE — Telephone Encounter (Signed)
Message copied by Annett Fabian on Wed Dec 15, 2013  2:51 PM ------      Message from: Stan Head E      Created: Wed Dec 15, 2013  2:38 PM      Regarding: Pulmonary appt       Please get him a pulmonary appt             Reasons are cough and dyspnea, COPD ------

## 2013-12-15 NOTE — Telephone Encounter (Signed)
Patient is scheduled for Dr. Sherene Sires 12/21/13 9:45.  I have notified the patient's wife.  She is asked to have him call back with any questions

## 2013-12-15 NOTE — Op Note (Signed)
[facility] [address] [place], [address]   ENDOSCOPY PROCEDURE REPORT  PATIENT: Angel Khan, Angel Khan  MR#: [id number] BIRTHDATE: 1941-07-09 , 72  yrs. old GENDER: Male ENDOSCOPIST: Iva Boop, MD, [facility] PROCEDURE DATE:  12/15/2013 PROCEDURE:  Esophagoscopy and Maloney dilation of esophagus ASA CLASS:     Class III INDICATIONS:  Dysphagia. MEDICATIONS: propofol (Diprivan) 100mg  IV, MAC sedation, administered by CRNA, and These medications were titrated to patient response per physician's verbal order TOPICAL ANESTHETIC: none  DESCRIPTION OF PROCEDURE: After the risks benefits and alternatives of the procedure were thoroughly explained, informed consent was obtained.  The LB [id number] endoscope was introduced through the mouth and advanced to the stomach antrum. Without limitations.  The instrument was slowly withdrawn as the mucosa was fully examined.        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  STOMACH: The mucosa of the stomach appeared normal.  Retroflexed views revealed no abnormalities.     The scope was then withdrawn from the patient, a 54 Fr Maloney dilator easily passed and no heme, and the procedure completed.  COMPLICATIONS: There were no complications. ENDOSCOPIC IMPRESSION: 1.   There was evidence of Barrett's esophagus - short - segment known and no dysplasia on biopsies early 2014 2.   The mucosa of the stomach appeared normal  RECOMMENDATIONS: 1.  Clear liquids until 330 PM, then soft foods rest of day.  Resume prior diet tomorrow. 2.  If swallwoing problems persist call back.    eSigned:  Iva Boop, MD, [facility] 12/15/2013 2:33 PM   CC:The Patient  and Jeanie Sewer, PA-C

## 2013-12-15 NOTE — Progress Notes (Signed)
A/ox3 pleased with MAC, report to Tracy RN 

## 2013-12-15 NOTE — Patient Instructions (Addendum)
The esophagus looked about the same - I did stretch it to see if that helps you swallow better.  If that does not help you swallow better please call me and we will determine if you need other testing.  I think the phlegm is coming from your lungs. i can have you see a lung specialist if you'd like or you could ask your primary care provider to arrange that.  Go ahead and restart Plavix (clopidogrel) tomorrow.  I appreciate the opportunity to care for you. Iva Boop, MD, FACG  YOU HAD AN ENDOSCOPIC PROCEDURE TODAY AT THE [facility]: Refer to the procedure report that was given to you for any specific questions about what was found during the examination.  If the procedure report does not answer your questions, please call your gastroenterologist to clarify.  If you requested that your care partner not be given the details of your procedure findings, then the procedure report has been included in a sealed envelope for you to review at your convenience later.  YOU SHOULD EXPECT: Some feelings of bloating in the abdomen. Passage of more gas than usual.  Walking can help get rid of the air that was put into your GI tract during the procedure and reduce the bloating. If you had a lower endoscopy (such as a colonoscopy or flexible sigmoidoscopy) you may notice spotting of blood in your stool or on the toilet paper. If you underwent a bowel prep for your procedure, then you may not have a normal bowel movement for a few days.  DIET: Your first meal following the procedure should be a light meal and then it is ok to progress to your normal diet.  A half-sandwich or bowl of soup is an example of a good first meal.  Heavy or fried foods are harder to digest and may make you feel nauseous or bloated.  Likewise meals heavy in dairy and vegetables can cause extra gas to form and this can also increase the bloating.  Drink plenty of fluids but you should avoid alcoholic beverages for 24  hours.  ACTIVITY: Your care partner should take you home directly after the procedure.  You should plan to take it easy, moving slowly for the rest of the day.  You can resume normal activity the day after the procedure however you should NOT DRIVE or use heavy machinery for 24 hours (because of the sedation medicines used during the test).    SYMPTOMS TO REPORT IMMEDIATELY: A gastroenterologist can be reached at any hour.  During normal business hours, 8:30 AM to 5:00 PM Monday through Friday, call [phone].  After hours and on weekends, please call the GI answering service at [phone] who will take a message and have the physician on call contact you.   Following upper endoscopy (EGD)  Vomiting of blood or coffee ground material  New chest pain or pain under the shoulder blades  Painful or persistently difficult swallowing  New shortness of breath  Fever of 100F or higher  Black, tarry-looking stools  FOLLOW UP: If any biopsies were taken you will be contacted by phone or by letter within the next 1-3 weeks.  Call your gastroenterologist if you have not heard about the biopsies in 3 weeks.  Our staff will call the home number listed on your records the next business day following your procedure to check on you and address any questions or concerns that you may have at that time regarding the  information given to you following your procedure. This is a courtesy call and so if there is no answer at the home number and we have not heard from you through the emergency physician on call, we will assume that you have returned to your regular daily activities without incident.  SIGNATURES/CONFIDENTIALITY: You and/or your care partner have signed paperwork which will be entered into your electronic medical record.  These signatures attest to the fact that that the information above on your After Visit Summary has been reviewed and is understood.  Full responsibility of the confidentiality  of this discharge information lies with you and/or your care-partner.

## 2013-12-16 ENCOUNTER — Telehealth: Payer: Self-pay | Admitting: *Deleted

## 2013-12-16 NOTE — Telephone Encounter (Signed)
  Follow up Call-  Call back number 12/15/2013 03/23/2013  Post procedure Call Back phone  # [phone] -  Permission to leave phone message Yes Yes     Patient questions:  Do you have a fever, pain , or abdominal swelling? no Pain Score  0 *  Have you tolerated food without any problems? yes  Have you been able to return to your normal activities? yes  Do you have any questions about your discharge instructions: Diet   no Medications  no Follow up visit  no   Do you have questions or concerns about your Care? no  Actions: * If pain score is 4 or above: No action needed, pain <4.

## 2013-12-21 ENCOUNTER — Ambulatory Visit (INDEPENDENT_AMBULATORY_CARE_PROVIDER_SITE_OTHER): Payer: Medicare Other | Admitting: Internal Medicine

## 2013-12-21 ENCOUNTER — Encounter: Payer: Self-pay | Admitting: Internal Medicine

## 2013-12-21 VITALS — BP 140/72 | HR 96 | Temp 97.8°F | Ht 66.5 in | Wt 186.0 lb

## 2013-12-21 DIAGNOSIS — R059 Cough, unspecified: Secondary | ICD-10-CM

## 2013-12-21 DIAGNOSIS — I10 Essential (primary) hypertension: Secondary | ICD-10-CM

## 2013-12-21 DIAGNOSIS — R058 Other specified cough: Secondary | ICD-10-CM

## 2013-12-21 DIAGNOSIS — R05 Cough: Secondary | ICD-10-CM

## 2013-12-21 MED ORDER — OLMESARTAN MEDOXOMIL 20 MG PO TABS: ORAL_TABLET | ORAL | Status: DC

## 2013-12-21 MED ORDER — FAMOTIDINE 20 MG PO TABS: ORAL_TABLET | ORAL | Status: DC

## 2013-12-21 NOTE — Progress Notes (Signed)
   Subjective:    Patient ID: Angel Khan, male    DOB: 20-Jul-1941  MRN: [id number]  HPI  72 yowm quit smoking 1977 with cough then but resolved completely until around 2009 when recurred and eval by [facility] (does not remember dx or name)  Referred 12/21/13  by Dr Leone Payor for cough  12/21/2013 1st [facility] office visit/ Tahir Blank on ACEi cc persistent cough x off and on x one year worse in am assoc with dysphagia but no overt sinus complaints with cough productive light beige mucus < 1/2 tsp total daily  does not awaken and does not affect breathing.  Has doe x heavy exertion like using chain saw, has to walk in moderate pace can still do hills. Constant urge to clear throat, mild assoc hoarseness   No obvious day to day or daytime variabilty or assoc chronic cough or cp or chest tightness, subjective wheeze overt sinus  symptoms. No unusual exp hx or h/o childhood pna/ asthma or knowledge of premature birth.  Sleeping ok without nocturnal  or early am exacerbation  of respiratory  c/o's or need for noct saba. Also denies any obvious fluctuation of symptoms with weather or environmental changes or other aggravating or alleviating factors except as outlined above   Current Medications, Allergies, Complete Past Medical History, Past Surgical History, Family History, and Social History were reviewed in [employer] record.          Review of Systems  Constitutional: Negative for fever, chills, activity change, appetite change and unexpected weight change.  HENT: Positive for congestion and trouble swallowing. Negative for dental problem, postnasal drip, rhinorrhea, sneezing, sore throat and voice change.   Eyes: Negative for visual disturbance.  Respiratory: Positive for cough and shortness of breath. Negative for choking.   Cardiovascular: Negative for chest pain and leg swelling.  Gastrointestinal: Negative for nausea, vomiting and abdominal pain.   Genitourinary: Negative for difficulty urinating.       Heartburn Indigestion  Musculoskeletal: Negative for arthralgias.  Skin: Negative for rash.  Psychiatric/Behavioral: Negative for behavioral problems and confusion.       Objective:   Physical Exam  Wt Readings from Last 3 Encounters:  12/21/13 186 lb (84.369 kg)  12/15/13 186 lb (84.369 kg)  11/30/13 186 lb 6.4 oz (84.55 kg)     Dentures  HEENT mild turbinate edema.  Oropharynx no thrush or excess pnd or cobblestoning.  No JVD or cervical adenopathy. Mild accessory muscle hypertrophy. Trachea midline, nl thryroid. Chest was hyperinflated by percussion with diminished breath sounds and moderate increased exp time without wheeze. Hoover sign positive at mid inspiration. Regular rate and rhythm without murmur gallop or rub or increase P2 or edema.  Abd: no hsm, nl excursion. Ext warm without cyanosis or clubbing.      cxr 12/06/13  FINDINGS: There are no rib fractures or complications thereof. Both lungs  are clear and the cardiomediastinal contours are normal. Prior coronary  bypass.  IMPRESSION: Normal ribs. No active disease in the chest.           Assessment & Plan:

## 2013-12-21 NOTE — Patient Instructions (Addendum)
Stop fish oil for now eat more salmon  Stop lisinopril and start benicar 20 mg one half daily - this is the only way to tell what your problem is  Pantoprazole (protonix) 40 mg   Take 30-60 min before first meal of the day and Pepcid ac  20 mg one bedtime until cough is completely gone x 2 weeks  GERD (REFLUX)  is an extremely common cause of respiratory symptoms, many times with no significant heartburn at all.    It can be treated with medication, but also with lifestyle changes including avoidance of late meals, excessive alcohol, smoking cessation, and avoid fatty foods, chocolate, peppermint, colas, red wine, and acidic juices such as orange juice.  NO MINT OR MENTHOL PRODUCTS SO NO COUGH DROPS  USE SUGARLESS CANDY INSTEAD (jolley ranchers or Stover's)  NO OIL BASED VITAMINS - use powdered substitutes.      If better after a month see your primary doctor for follow up and take your samples with you - but if not, return here in a month

## 2013-12-23 DIAGNOSIS — I10 Essential (primary) hypertension: Secondary | ICD-10-CM | POA: Insufficient documentation

## 2013-12-23 DIAGNOSIS — R053 Chronic cough: Secondary | ICD-10-CM | POA: Insufficient documentation

## 2013-12-23 DIAGNOSIS — R05 Cough: Secondary | ICD-10-CM | POA: Insufficient documentation

## 2013-12-23 NOTE — Assessment & Plan Note (Signed)
ACE inhibitors are problematic in  pts with airway complaints because  even experienced pulmonologists can't always distinguish ace effects from copd/asthma.  By themselves they don't actually cause a problem, much like oxygen can't by itself start a fire, but they certainly serve as a powerful catalyst or enhancer for any "fire"  or inflammatory process in the upper airway, be it caused by an ET  tube or more commonly reflux (especially in the obese or pts with known GERD or who are on biphoshonates).    In the era of ARB near equivalency until we have a better handle on the reversibility of the airway problem, it just makes sense to avoid ACEI  entirely in the short run and then decide later, having established a level of airway control using a reasonable limited regimen, whether to add back ace but even then being very careful to observe the pt for worsening airway control and number of meds used/ needed to control symptoms.    For now try benicar 20 mg one half daily and f/u primary if better, here if not.

## 2013-12-23 NOTE — Assessment & Plan Note (Signed)
Classic Upper airway cough syndrome, so named because it's frequently impossible to sort out how much is  CR/sinusitis with freq throat clearing (which can be related to primary GERD)   vs  causing  secondary (" extra esophageal")  GERD from wide swings in gastric pressure that occur with throat clearing, often  promoting self use of mint and menthol lozenges that reduce the lower esophageal sphincter tone and exacerbate the problem further in a cyclical fashion.   These are the same pts (now being labeled as having "irritable larynx syndrome" by some cough centers) who not infrequently have a history of having failed to tolerate ace inhibitors,  dry powder inhalers or biphosphonates or report having atypical reflux symptoms that don't respond to standard doses of PPI , and are easily confused as having aecopd or asthma flares by even experienced allergists/ pulmonologists.   In retrospect this is a classic acei case until proven otherwise > see hbp  Max gerd rx rx in meantime including diet/ lifestyle recs in meantime (no fish oil for now).  See instructions for specific recommendations which were reviewed directly with the patient who was given a copy with highlighter outlining the key components.

## 2014-06-13 ENCOUNTER — Other Ambulatory Visit: Payer: Self-pay | Admitting: Internal Medicine

## 2014-09-11 ENCOUNTER — Other Ambulatory Visit: Payer: Self-pay | Admitting: Internal Medicine

## 2014-12-21 ENCOUNTER — Other Ambulatory Visit: Payer: Self-pay | Admitting: Internal Medicine

## 2015-02-21 ENCOUNTER — Telehealth: Payer: Self-pay | Admitting: Internal Medicine

## 2015-02-21 ENCOUNTER — Ambulatory Visit (INDEPENDENT_AMBULATORY_CARE_PROVIDER_SITE_OTHER): Payer: Medicare Other | Admitting: Internal Medicine

## 2015-02-21 ENCOUNTER — Ambulatory Visit (INDEPENDENT_AMBULATORY_CARE_PROVIDER_SITE_OTHER)
Admission: RE | Admit: 2015-02-21 | Discharge: 2015-02-21 | Disposition: A | Discharge status: Home / Self Care | Payer: Medicare Other | Source: Ambulatory Visit | Attending: Internal Medicine | Admitting: Internal Medicine

## 2015-02-21 ENCOUNTER — Encounter: Payer: Self-pay | Admitting: Internal Medicine

## 2015-02-21 VITALS — BP 180/106 | HR 99 | Ht 67.0 in | Wt 186.2 lb

## 2015-02-21 DIAGNOSIS — I10 Essential (primary) hypertension: Secondary | ICD-10-CM

## 2015-02-21 DIAGNOSIS — R05 Cough: Secondary | ICD-10-CM

## 2015-02-21 DIAGNOSIS — R058 Other specified cough: Secondary | ICD-10-CM

## 2015-02-21 MED ORDER — OLMESARTAN MEDOXOMIL 20 MG PO TABS: ORAL_TABLET | ORAL | Status: DC

## 2015-02-21 MED ORDER — METHYLPREDNISOLONE ACETATE 80 MG/ML IJ SUSP
120.0000 mg | Freq: Once | INTRAMUSCULAR | Status: AC
  Administered 2015-02-21: 120 mg via INTRAMUSCULAR

## 2015-02-21 NOTE — Progress Notes (Signed)
Subjective:    Patient ID: Angel Khan, male    DOB: 05-29-1941  MRN: [id number]    Brief patient profile:  83 yowm quit smoking 1977 with cough then but resolved completely until around 2009 when recurred and eval by [facility] (does not remember dx or name)  Referred to pulmonary clinic  12/21/13  by Dr Carlean Purl for cough on ACEi   History of Present Illness  12/21/2013 1st [facility] office visit/ Angel Khan on ACEi cc persistent cough x off and on x one year worse in am assoc with dysphagia but no overt sinus complaints with cough productive light beige mucus < 1/2 tsp total daily  does not awaken and does not affect breathing.  Has doe x heavy exertion like using chain saw, has to walk in moderate pace can still do hills. Constant urge to clear throat, mild assoc hoarseness  rec Stop fish oil for now eat more salmon Stop lisinopril and start benicar 20 mg one half daily - this is the only way to tell what your problem is Pantoprazole (protonix) 40 mg   Take 30-60 min before first meal of the day and Pepcid ac  20 mg one bedtime until cough is completely gone x 2 weeks GERD  If better after a month see your primary doctor for follow up and take your samples with you - but if not, return here in a month    02/21/2015 f/u ov/Angel Khan re: recurrent cough x 2 month on losartan now p completely better on benicar Chief Complaint  Patient presents with  . Acute Visit    phlegm in the back of throat, coughing a lot but cannot get the phlegm out.  insidious onset gradually worse sense of throat congestion sev hours p up in am and resolves overnight - never disturbs sleep Total amt of mucus is < one tsp per day  No obvious day to day or daytime variabilty or assoc sob  or cp or chest tightness, subjective wheeze overt sinus or hb symptoms. No unusual exp hx or h/o childhood pna/ asthma or knowledge of premature birth.  Sleeping ok without nocturnal  or early am exacerbation  of  respiratory  c/o's or need for noct saba. Also denies any obvious fluctuation of symptoms with weather or environmental changes or other aggravating or alleviating factors except as outlined above   Current Medications, Allergies, Complete Past Medical History, Past Surgical History, Family History, and Social History were reviewed in [employer] record.  ROS  The following are not active complaints unless bolded sore throat, dysphagia, dental problems, itching, sneezing,  nasal congestion or excess/ purulent secretions, ear ache,   fever, chills, sweats, unintended wt loss, pleuritic or exertional cp, hemoptysis,  orthopnea pnd or leg swelling, presyncope, palpitations, heartburn, abdominal pain, anorexia, nausea, vomiting, diarrhea  or change in bowel or urinary habits, change in stools or urine, dysuria,hematuria,  rash, arthralgias, visual complaints, headache, numbness weakness or ataxia or problems with walking or coordination,  change in mood/affect or memory.                      Objective:   Physical Exam   02/21/2015        186  Wt Readings from Last 3 Encounters:  12/21/13 186 lb (84.369 kg)  12/15/13 186 lb (84.369 kg)  11/30/13 186 lb 6.4 oz (84.55 kg)        HEENT mild turbinate edema.  Dentures  in place/ Oropharynx no thrush or excess pnd or cobblestoning.  No JVD or cervical adenopathy. Mild accessory muscle hypertrophy. Trachea midline, nl thryroid. Chest was hyperinflated by percussion with diminished breath sounds and moderate increased exp time without wheeze. Hoover sign positive at mid inspiration. Regular rate and rhythm without murmur gallop or rub or increase P2 or edema.  Abd: no hsm, nl excursion. Ext warm without cyanosis or clubbing.        CXR PA and Lateral:   02/21/2015 :     I personally reviewed images and agree with radiology impression as follows:       Peribronchial thickening, which may reflect chronic bronchitis  given history of COPD although prior studies are not available for comparison to assess chronicity.        Assessment & Plan:

## 2015-02-21 NOTE — Patient Instructions (Addendum)
Stop losartan Start benicar 20 mg one daily  depomedrol 120 mg IM today Continue prevacid 30 mg Take 30-60 min before first meal of the day  and add Pepcid ac 20 mg at bedtime   GERD (REFLUX)  is an extremely common cause of respiratory symptoms just like yours , many times with no obvious heartburn at all.    It can be treated with medication, but also with lifestyle changes including avoidance of late meals, excessive alcohol, smoking cessation, and avoid fatty foods, chocolate, peppermint, colas, red wine, and acidic juices such as orange juice.  NO MINT OR MENTHOL PRODUCTS SO NO COUGH DROPS  USE SUGARLESS CANDY INSTEAD (Jolley ranchers or Stover's or Life Savers) or even ice chips will also do - the key is to swallow to prevent all throat clearing. NO OIL BASED VITAMINS - use powdered substitutes.   Best cough medicine is delsym take 2 tsp every 12 hours and if still can't stop coughing take norco/vicodin  Please remember to go to the  x-ray department downstairs for your tests - we will call you with the results when they are available.     Please schedule a follow up office visit in 4 weeks, sooner if needed bring all meds with you

## 2015-02-21 NOTE — Telephone Encounter (Signed)
As well as Sertrline HLC 50mg , Amitriptyline HLC 50mg 

## 2015-02-21 NOTE — Progress Notes (Signed)
Quick Note:  Spoke with pt and notified of results per Dr. Wert. Pt verbalized understanding and denied any questions.  ______ 

## 2015-02-21 NOTE — Telephone Encounter (Signed)
Meds that pt forgot to mention added to his list  I advised he should bring all meds to his next visit with [place]  He verbalized understanding  Msg sent to MW as Juluis Rainier

## 2015-02-23 ENCOUNTER — Encounter: Payer: Self-pay | Admitting: Internal Medicine

## 2015-02-23 NOTE — Assessment & Plan Note (Signed)
-   trial off acei 12/21/13 > resolved on benicar  For reasons that may related to vascular permability and nitric oxide pathways but not elevated  bradykinin levels (as seen with  ACEi use) losartan in the generic form has been reported now from mulitple sources  to cause a similar pattern of non-specific  upper airway symptoms as seen with acei.   This has not been reported with exposure to the other ARB's to date, so it seems reasonable for now to try either generic diovan or avapro if ARB needed or use an alternative class altogether.  See:  Lelon Frohlich Allergy Asthma Immunol  2008: 101: p [phone]    Try back on benicar and max rx for GERD/ cyclical cough then regroup in 4 weeks

## 2015-02-23 NOTE — Assessment & Plan Note (Signed)
Trial off acei 12/21/13 >> resolved Trial off losartan 02/21/15 >>>  Try benicar 20 mg daily and regroup in 4 weeks    Each maintenance medication was reviewed in detail including most importantly the difference between maintenance and as needed and under what circumstances the prns are to be used.  Please see instructions for details which were reviewed in writing and the patient given a copy.

## 2015-03-14 ENCOUNTER — Telehealth: Payer: Self-pay | Admitting: Internal Medicine

## 2015-03-14 NOTE — Telephone Encounter (Signed)
Patient with 3-8 semi-formed to liquid BM a day.  He is offered an appt for tomorrow.  He wantst  wait until 03/22/15 when he has another appt in the building.  He is rescheduled for 03/22/15 8:30

## 2015-03-15 ENCOUNTER — Ambulatory Visit: Payer: Medicare Other | Admitting: Physician Assistant

## 2015-03-22 ENCOUNTER — Encounter: Payer: Self-pay | Admitting: Gastroenterology

## 2015-03-22 ENCOUNTER — Ambulatory Visit (INDEPENDENT_AMBULATORY_CARE_PROVIDER_SITE_OTHER): Payer: Medicare Other | Admitting: Gastroenterology

## 2015-03-22 ENCOUNTER — Encounter: Payer: Self-pay | Admitting: Adult Health

## 2015-03-22 ENCOUNTER — Ambulatory Visit (INDEPENDENT_AMBULATORY_CARE_PROVIDER_SITE_OTHER): Payer: Medicare Other | Admitting: Adult Health

## 2015-03-22 VITALS — BP 124/74 | HR 90 | Ht 65.25 in | Wt 183.0 lb

## 2015-03-22 DIAGNOSIS — R058 Other specified cough: Secondary | ICD-10-CM

## 2015-03-22 DIAGNOSIS — R05 Cough: Secondary | ICD-10-CM | POA: Diagnosis not present

## 2015-03-22 DIAGNOSIS — R197 Diarrhea, unspecified: Secondary | ICD-10-CM

## 2015-03-22 MED ORDER — OLMESARTAN MEDOXOMIL 20 MG PO TABS: 20.0000 mg | ORAL_TABLET | Freq: Every day | ORAL | Status: DC

## 2015-03-22 NOTE — Patient Instructions (Signed)
Call back with any further symptoms. CC:  Angel Khan

## 2015-03-22 NOTE — Progress Notes (Signed)
Subjective:    Patient ID: Angel Khan, male    DOB: 04-Sep-1941  MRN: [id number]    Brief patient profile:  61 yowm quit smoking 1977 with cough then but resolved completely until around 2009 when recurred and eval by [facility] (does not remember dx or name)  Referred to pulmonary clinic  12/21/13  by Dr Carlean Purl for cough on ACEi   History of Present Illness  12/21/2013 1st [facility] office visit/ Wert on ACEi cc persistent cough x off and on x one year worse in am assoc with dysphagia but no overt sinus complaints with cough productive light beige mucus < 1/2 tsp total daily  does not awaken and does not affect breathing.  Has doe x heavy exertion like using chain saw, has to walk in moderate pace can still do hills. Constant urge to clear throat, mild assoc hoarseness  rec Stop fish oil for now eat more salmon Stop lisinopril and start benicar 20 mg one half daily - this is the only way to tell what your problem is Pantoprazole (protonix) 40 mg   Take 30-60 min before first meal of the day and Pepcid ac  20 mg one bedtime until cough is completely gone x 2 weeks GERD  If better after a month see your primary doctor for follow up and take your samples with you - but if not, return here in a month    02/21/2015 f/u ov/Wert re: recurrent cough x 2 month on losartan now p completely better on benicar Chief Complaint  Patient presents with  . Acute Visit    phlegm in the back of throat, coughing a lot but cannot get the phlegm out.  insidious onset gradually worse sense of throat congestion sev hours p up in am and resolves overnight - never disturbs sleep Total amt of mucus is < one tsp per day >change losartan to benicar   03/22/2015 Follow and med review : chronic cough  Pt returns for follow up and med review.  We reviewed all his meds and organized them into a med calendar with pt education.  Appears to be taking correctly  Feeling better still has some lingering  cough with clear mucus.  No fever, hemoptysis, chest pain, orthopnea, edema.  Last ov changed from losartan to benicar. Tolerating well.    Current Medications, Allergies, Complete Past Medical History, Past Surgical History, Family History, and Social History were reviewed in [employer] record.  ROS  The following are not active complaints unless bolded sore throat, dysphagia, dental problems, itching, sneezing,  nasal congestion or excess/ purulent secretions, ear ache,   fever, chills, sweats, unintended wt loss, pleuritic or exertional cp, hemoptysis,  orthopnea pnd or leg swelling, presyncope, palpitations, heartburn, abdominal pain, anorexia, nausea, vomiting, diarrhea  or change in bowel or urinary habits, change in stools or urine, dysuria,hematuria,  rash, arthralgias, visual complaints, headache, numbness weakness or ataxia or problems with walking or coordination,  change in mood/affect or memory.                      Objective:   Physical Exam   02/21/2015        186 >183 03/22/2015      HEENT mild turbinate edema.  Dentures in place/ Oropharynx no thrush or excess pnd or cobblestoning.  No JVD or cervical adenopathy. Mild accessory muscle hypertrophy. Trachea midline, nl thryroid. Chest was hyperinflated by percussion with diminished breath sounds  and moderate increased exp time without wheeze. Hoover sign positive at mid inspiration. Regular rate and rhythm without murmur gallop or rub or increase P2 or edema.  Abd: no hsm, nl excursion. Ext warm without cyanosis or clubbing.        CXR PA and Lateral:   02/21/2015 :     I personally reviewed images and agree with radiology impression as follows:       Peribronchial thickening, which may reflect chronic bronchitis given history of COPD although prior studies are not available for comparison to assess chronicity.        Assessment & Plan:

## 2015-03-22 NOTE — Progress Notes (Signed)
     03/22/2015 Angel Khan [id number] 1941/03/28   History of Present Illness:  This is a pleasant 74 year old male who is known to Dr. Carlean Purl for previous procedures.  Last colonoscopy was 02/2013 at which time he was found to have two polyps (one diminutive TA and one diminutive sessile serrated adenoma), diverticulosis, and internal/external hemorrhoids.  He presents to our office today with complaints of diarrhea and abdominal cramping that began about 2 weeks ago.  Was having diarrhea 3-5 times per day.  Was feeling fine and having normal BM's prior to that.  He says that actually yesterday and today he has been better with no diarrhea or abdominal pain.  Denies having any nausea, vomiting, fevers, chills.  Says that appetite is fine.  He denies any travel.  Was treated with a Z-pack recently.  Only new medication is Benicar, which was started about one month ago.   Current Medications, Allergies, Past Medical History, Past Surgical History, Family History and Social History were reviewed in [employer] record.   Physical Exam: BP 124/74 mmHg  Pulse 90  Ht 5' 5.25" (1.657 m)  Wt 183 lb (83.008 kg)  BMI 30.23 kg/m2 General: Well developed white male in no acute distress Head: Normocephalic and atraumatic Eyes:  Sclerae anicteric, conjunctiva pink  Ears: Normal auditory acuity Lungs: Clear throughout to auscultation Heart: Regular rate and rhythm Abdomen: Soft, non-distended.  Normal bowel sounds.  Minimal lower abdominal TTP. Musculoskeletal: Symmetrical with no gross deformities  Extremities: No edema  Neurological: Alert oriented x 4, grossly non-focal Psychological:  Alert and cooperative. Normal mood and affect  Assessment and Recommendations: -Diarrhea and abdominal cramping, subacute:  Now resolved for the past 2 days.  Likely was infectious gastroenteritis.  Advised the patient that it if his symptoms returned then for him to call our office  back and we can order stool studies and/or try a course of empiric antibiotic.    *Just of note, he was placed on Benicar about one month ago.  Doubt that he has developed celiac-type enteropathy from that at this point, but could be a consideration if diarrhea were to recur and continue.

## 2015-03-22 NOTE — Patient Instructions (Signed)
Follow med calendar closely and bring to each visit.  Continue on current regimen  May try  Mucinex DM As needed  For cough and congestion  follow up Dr. Melvyn Novas  In 4-6 weeks with PFT and As needed

## 2015-03-22 NOTE — Assessment & Plan Note (Signed)
Cont on current reigmen  Patient's medications were reviewed today and patient education was given. Computerized medication calendar was adjusted/completed   Plan  follow up in 4 weeks with PFT

## 2015-03-22 NOTE — Assessment & Plan Note (Signed)
Doing well on therapy follow up with PCP for b/p control  Avoid ace inhitibitors . Losartan in some pt has been seen to increase cough , will avoid this going forward if possible

## 2015-03-23 ENCOUNTER — Telehealth: Payer: Self-pay | Admitting: Gastroenterology

## 2015-03-23 ENCOUNTER — Other Ambulatory Visit: Payer: Self-pay | Admitting: *Deleted

## 2015-03-23 DIAGNOSIS — R197 Diarrhea, unspecified: Secondary | ICD-10-CM

## 2015-03-23 NOTE — Telephone Encounter (Signed)
Patient calling to report he had diarrhea x 3 yesterday and one time this morning. Denies bleeding, cramping or nausea. Please, advise.

## 2015-03-23 NOTE — Progress Notes (Signed)
Agree with Ms. Alphia Kava management.

## 2015-03-23 NOTE — Telephone Encounter (Signed)
Lab in [facility]. Patient notified and he will pick up stool kit.

## 2015-03-23 NOTE — Telephone Encounter (Signed)
I just saw him yesterday and he said that he was feeling better with no further diarrhea!!  Please order stool GI pathogen panel.  Thank you,  Jess

## 2015-04-03 ENCOUNTER — Other Ambulatory Visit: Payer: Medicare Other

## 2015-04-03 DIAGNOSIS — R197 Diarrhea, unspecified: Secondary | ICD-10-CM

## 2015-04-04 LAB — GASTROINTESTINAL PATHOGEN PANEL PCR
C. DIFFICILE TOX A/B, PCR: NEGATIVE
CAMPYLOBACTER, PCR: NEGATIVE
Cryptosporidium, PCR: NEGATIVE
E COLI 0157, PCR: NEGATIVE
E coli (ETEC) LT/ST PCR: NEGATIVE
E coli (STEC) stx1/stx2, PCR: NEGATIVE
GIARDIA LAMBLIA, PCR: NEGATIVE
Norovirus, PCR: NEGATIVE
Rotavirus A, PCR: NEGATIVE
SALMONELLA, PCR: NEGATIVE
Shigella, PCR: NEGATIVE

## 2015-04-04 NOTE — Addendum Note (Signed)
Addended by: Parke Poisson E on: 04/04/2015 02:39 PM   Modules accepted: Orders, Medications

## 2015-04-05 NOTE — Progress Notes (Signed)
Quick Note:  OK Good f/u prn ______

## 2015-04-05 NOTE — Progress Notes (Signed)
Quick Note:  No infection found - could mean it was resolving when he had this test (i.e. Still had an infection) Please get an update on sxs ______

## 2015-04-14 ENCOUNTER — Telehealth: Payer: Self-pay | Admitting: *Deleted

## 2015-04-14 NOTE — Telephone Encounter (Signed)
Received Pa request for Benicar 20 mg from [facility] Pt insurance now requires PA. Pt has been supplied with 30 day supply. PA submitted on 04/13/15 via [email or web address]  Key: [facility] Member ID: [id number] Pt has tried and failed Acei and Losartan.

## 2015-04-17 NOTE — Telephone Encounter (Signed)
PA approved 04/13/15-12/30/15 Benicar 20 mg #30 Approval # [id number] Spoke to [facility] with [facility].

## 2015-05-04 ENCOUNTER — Encounter: Payer: Self-pay | Admitting: Internal Medicine

## 2015-05-04 ENCOUNTER — Ambulatory Visit (INDEPENDENT_AMBULATORY_CARE_PROVIDER_SITE_OTHER): Payer: Medicare Other | Admitting: Internal Medicine

## 2015-05-04 ENCOUNTER — Other Ambulatory Visit (INDEPENDENT_AMBULATORY_CARE_PROVIDER_SITE_OTHER): Payer: Medicare Other

## 2015-05-04 ENCOUNTER — Ambulatory Visit (INDEPENDENT_AMBULATORY_CARE_PROVIDER_SITE_OTHER)
Admission: RE | Admit: 2015-05-04 | Discharge: 2015-05-04 | Disposition: A | Discharge status: Home / Self Care | Payer: Medicare Other | Source: Ambulatory Visit | Attending: Internal Medicine | Admitting: Internal Medicine

## 2015-05-04 ENCOUNTER — Encounter: Payer: Self-pay | Admitting: *Deleted

## 2015-05-04 DIAGNOSIS — R058 Other specified cough: Secondary | ICD-10-CM

## 2015-05-04 DIAGNOSIS — R05 Cough: Secondary | ICD-10-CM

## 2015-05-04 LAB — CBC WITH DIFFERENTIAL/PLATELET
Basophils Absolute: 0.1 10*3/uL (ref 0.0–0.1)
Basophils Relative: 0.9 % (ref 0.0–3.0)
EOS PCT: 3.3 % (ref 0.0–5.0)
Eosinophils Absolute: 0.3 10*3/uL (ref 0.0–0.7)
HCT: 45.6 % (ref 39.0–52.0)
Hemoglobin: 15.8 g/dL (ref 13.0–17.0)
LYMPHS ABS: 2.8 10*3/uL (ref 0.7–4.0)
Lymphocytes Relative: 31.1 % (ref 12.0–46.0)
MCHC: 34.7 g/dL (ref 30.0–36.0)
MCV: 87.9 fl (ref 78.0–100.0)
MONOS PCT: 8.4 % (ref 3.0–12.0)
Monocytes Absolute: 0.8 10*3/uL (ref 0.1–1.0)
NEUTROS ABS: 5.1 10*3/uL (ref 1.4–7.7)
NEUTROS PCT: 56.3 % (ref 43.0–77.0)
Platelets: 305 10*3/uL (ref 150.0–400.0)
RBC: 5.19 Mil/uL (ref 4.22–5.81)
RDW: 15.4 % (ref 11.5–15.5)
WBC: 9.1 10*3/uL (ref 4.0–10.5)

## 2015-05-04 LAB — PULMONARY FUNCTION TEST
DL/VA % PRED: 106 %
DL/VA: 4.64 ml/min/mmHg/L
DLCO unc % pred: 84 %
DLCO unc: 23.3 ml/min/mmHg
FEF 25-75 PRE: 1.97 L/s
FEF 25-75 Post: 2.09 L/sec
FEF2575-%Change-Post: 6 %
FEF2575-%PRED-PRE: 97 %
FEF2575-%Pred-Post: 103 %
FEV1-%Change-Post: 0 %
FEV1-%Pred-Post: 84 %
FEV1-%Pred-Pre: 84 %
FEV1-Post: 2.32 L
FEV1-Pre: 2.31 L
FEV1FVC-%Change-Post: 4 %
FEV1FVC-%Pred-Pre: 107 %
FEV6-%CHANGE-POST: -4 %
FEV6-%PRED-POST: 79 %
FEV6-%Pred-Pre: 83 %
FEV6-Post: 2.81 L
FEV6-Pre: 2.94 L
FEV6FVC-%CHANGE-POST: 0 %
FEV6FVC-%PRED-POST: 106 %
FEV6FVC-%Pred-Pre: 105 %
FVC-%Change-Post: -4 %
FVC-%PRED-POST: 75 %
FVC-%PRED-PRE: 78 %
FVC-POST: 2.82 L
FVC-Pre: 2.95 L
POST FEV6/FVC RATIO: 100 %
Post FEV1/FVC ratio: 82 %
Pre FEV1/FVC ratio: 78 %
Pre FEV6/FVC Ratio: 99 %
RV % pred: 112 %
RV: 2.59 L
TLC % PRED: 88 %
TLC: 5.63 L

## 2015-05-04 NOTE — Progress Notes (Signed)
PFT done today. 

## 2015-05-04 NOTE — Patient Instructions (Addendum)
For drainage take chlortrimeton (chlorpheniramine) 4 mg every 4 hours available over the counter (may cause drowsiness)   You do not copd in any form and we can write you a letter to that effect  Please see patient coordinator before you leave today  to schedule sinus CT  Please remember to go to the lab   department downstairs for your tests - we will call you with the results when they are available.  If not improving return with all medication/ [facility] list/ formulary     If  Better see your primary doctor for alternative cheaper options for your blood pressure but I would avoid ACE inhbitors which can aggravate coughing

## 2015-05-04 NOTE — Progress Notes (Signed)
Subjective:    Patient ID: Angel Khan, male    DOB: [birth year]  MRN: [id number]    Brief patient profile:  65 yowm quit smoking 1977 with cough then but resolved completely until around 2009 when recurred and eval by [facility] (does not remember dx or name of doc but says nothing was found)  Referred to pulmonary clinic  12/21/13  by Dr Carlean Purl for cough on ACEi with nl pfts 05/04/2015    History of Present Illness  12/21/2013 1st [facility] office visit/ Morgon Pamer on ACEi cc persistent cough x off and on x one year worse in am assoc with dysphagia but no overt sinus complaints with cough productive light beige mucus < 1/2 tsp total daily  does not awaken and does not affect breathing.  Has doe x heavy exertion like using chain saw, has to walk in moderate pace can still do hills. Constant urge to clear throat, mild assoc hoarseness  rec Stop fish oil for now eat more salmon Stop lisinopril and start benicar 20 mg one half daily - this is the only way to tell what your problem is Pantoprazole (protonix) 40 mg   Take 30-60 min before first meal of the day and Pepcid ac  20 mg one bedtime until cough is completely gone x 2 weeks GERD  If better after a month see your primary doctor for follow up and take your samples with you - but if not, return here in a month    02/21/2015 f/u ov/Dorla Guizar re: recurrent cough x 2 month on losartan now p completely better on benicar Chief Complaint  Patient presents with  . Acute Visit    phlegm in the back of throat, coughing a lot but cannot get the phlegm out.  insidious onset gradually worse sense of throat congestion sev hours p up in am and resolves overnight - never disturbs sleep Total amt of mucus is < one tsp per day >change losartan to benicar   03/22/2015 NP Follow and med review : chronic cough  Pt returns for follow up and med review.  We reviewed all his meds and organized them into a med calendar with pt education.  Appears to be  taking correctly  Feeling better still has some lingering cough with clear mucus.  No fever, hemoptysis, chest pain, orthopnea, edema.  Last ov changed from losartan to benicar. Tolerating well rec Follow med calendar closely and bring to each visit.  Continue on current regimen  May try  Mucinex DM As needed  For cough and congestion    05/04/2015 f/u ov/[name] re: chronic cough/ no med calendar  Chief Complaint  Patient presents with  . Upper Airway Cough Syndrome    PFT performed today.  first noted this flare of recurent cough assoc with    sensation of extra phlegm in throat jan 2016   But really not producing excess or purulent sputum Never wakes from sleep.  Present w/in a few hours of waking   No obvious day to day or daytime variabilty or assoc sob or cp or chest tightness, subjective wheeze overt sinus or hb symptoms. No unusual exp hx or h/o childhood pna/ asthma or knowledge of premature birth.  Sleeping ok without nocturnal  or early am exacerbation  of respiratory  c/o's or need for noct saba. Also denies any obvious fluctuation of symptoms with weather or environmental changes or other aggravating or alleviating factors except as outlined above   Current Medications,  Allergies, Complete Past Medical History, Past Surgical History, Family History, and Social History were reviewed in [employer] record.  ROS  The following are not active complaints unless bolded sore throat, dysphagia, dental problems, itching, sneezing,  nasal congestion or excess/ purulent secretions, ear ache,   fever, chills, sweats, unintended wt loss, pleuritic or exertional cp, hemoptysis,  orthopnea pnd or leg swelling, presyncope, palpitations, heartburn, abdominal pain, anorexia, nausea, vomiting, diarrhea  or change in bowel or urinary habits, change in stools or urine, dysuria,hematuria,  rash, arthralgias, visual complaints, headache, numbness weakness or ataxia or problems with  walking or coordination,  change in mood/affect or memory.             Objective:   Physical Exam   02/21/2015        186 >183 03/22/2015 > 05/04/2015   188    HEENT mild/moderate bilateral non-specific  turbinate edema. Full set of entures in place/ Oropharynx no thrush or excess pnd or cobblestoning.  No JVD or cervical adenopathy. Mild accessory muscle hypertrophy. Trachea midline, nl thryroid. Chest was hyperinflated by percussion with diminished breath sounds and moderate increased exp time without wheeze. Hoover sign positive at mid inspiration. Regular rate and rhythm without murmur gallop or rub or increase P2 or edema.  Abd: no hsm, nl excursion. Ext warm without cyanosis or clubbing.        CXR PA and Lateral:   02/21/2015 :     I personally reviewed images and agree with radiology impression as follows:       Peribronchial thickening, which may reflect chronic bronchitis given history of COPD although prior studies are not available for comparison to assess chronicity.        Assessment & Plan:

## 2015-05-05 LAB — ALLERGY FULL PROFILE
ALLERGEN, D PTERNOYSSINUS, D1: 0.12 kU/L — AB
Alternaria Alternata: <0.1 kU/L
Aspergillus fumigatus, m3: <0.1 kU/L
Bahia Grass: 1.42 kU/L — ABNORMAL HIGH
Bermuda Grass: 1.09 kU/L — ABNORMAL HIGH
Box Elder IgE: 0.6 kU/L — ABNORMAL HIGH
CAT DANDER: 1.29 kU/L — AB
Candida Albicans: <0.1 kU/L
Common Ragweed: 0.7 kU/L — ABNORMAL HIGH
Curvularia lunata: <0.1 kU/L
D. farinae: 0.17 kU/L — ABNORMAL HIGH
DOG DANDER: 1.16 kU/L — AB
Elm IgE: 0.59 kU/L — ABNORMAL HIGH
Fescue: 3.45 kU/L — ABNORMAL HIGH
G005 Rye, Perennial: 3.43 kU/L — ABNORMAL HIGH
G009 Red Top: 3.43 kU/L — ABNORMAL HIGH
Goldenrod: 0.53 kU/L — ABNORMAL HIGH
House Dust Hollister: 0.33 kU/L — ABNORMAL HIGH
IGE (IMMUNOGLOBULIN E), SERUM: 610 kU/L — AB (ref <115)
Lamb's Quarters: 0.69 kU/L — ABNORMAL HIGH
Oak: 0.55 kU/L — ABNORMAL HIGH
PLANTAIN: 0.59 kU/L — AB
Sycamore Tree: 0.57 kU/L — ABNORMAL HIGH
Timothy Grass: 3.45 kU/L — ABNORMAL HIGH

## 2015-05-06 ENCOUNTER — Encounter: Payer: Self-pay | Admitting: Internal Medicine

## 2015-05-06 NOTE — Assessment & Plan Note (Addendum)
-   trial off acei 12/21/13 > resolved on benicar - recurred 12/2014 on cozar > resumed benicar  02/23/2015  -med calendar 03/22/2015 > did not bring to pulmonary clinic 05/04/15 as requested  - PFT's 05/04/15 wnl  -CT sinus 05/04/2015  1. No CT evidence of acute or chronic sinusitis. 2. 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 - allergy profile 05/05/2015  >  Eos 0.3/   IgE 610  Grass/ trees/dogs/cats    I had an extended discussion with the patient reviewing all relevant studies completed to date and  lasting 15 to 20 minutes of a 25 minute visit on the following ongoing concerns:  1) this is not copd and I strongly doubt cough variant asthma so ok for planned R shoulder surgery   2) most likely it is pnds related to allergic rhinitis with first line of rx 1st gen H1 and allergy eval can be considered but pulmonary f/u is prn

## 2015-05-18 ENCOUNTER — Encounter: Payer: Self-pay | Admitting: Internal Medicine

## 2015-08-24 IMAGING — CT CT PARANASAL SINUSES LIMITED
1 of 2 series · 15 of 19 positions shown, 19 images · non-contrast
Comparison: None.

CLINICAL DATA: Chronic sinus drainage since [REDACTED]. Antibiotics
has not resolved symptoms.

EXAM:
CT PARANASAL SINUS LIMITED WITHOUT CONTRAST
TECHNIQUE: Non-contiguous multidetector CT images of the paranasal sinuses were
obtained in a single plane without contrast.

[Series 5: ltd sinus 3.0 h30s · axial · 0.34mm/px · z∈[-88,+7]mm · 15 of 18 slices shown, 19 images]
[im 2/18  brain]
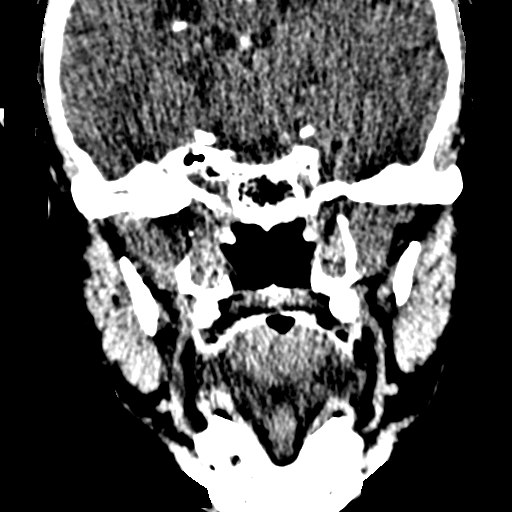
[im 2/18  bone]
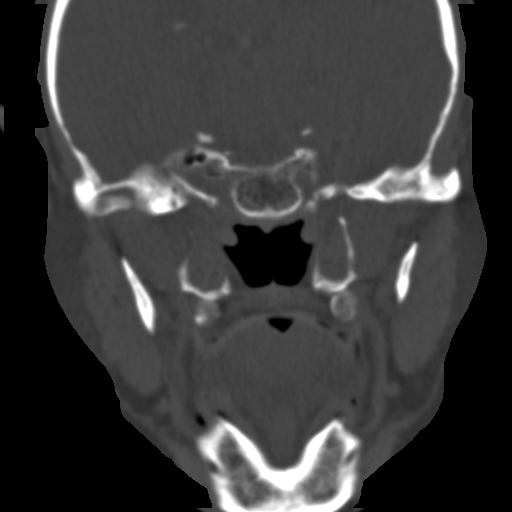
[im 3/18  bone]
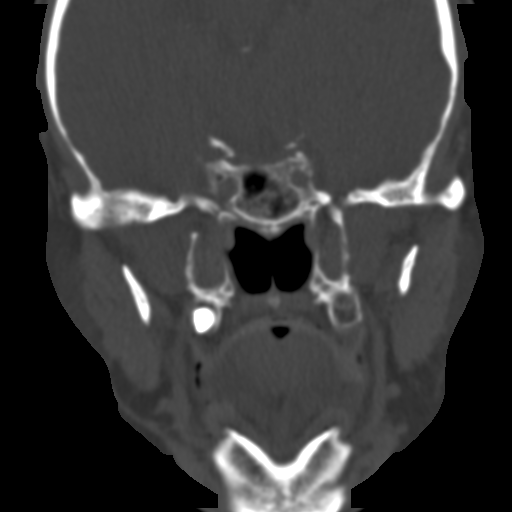
[im 4/18  bone]
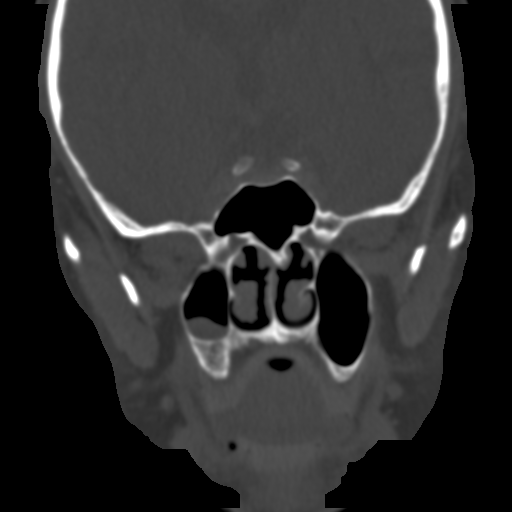
[im 5/18  bone]
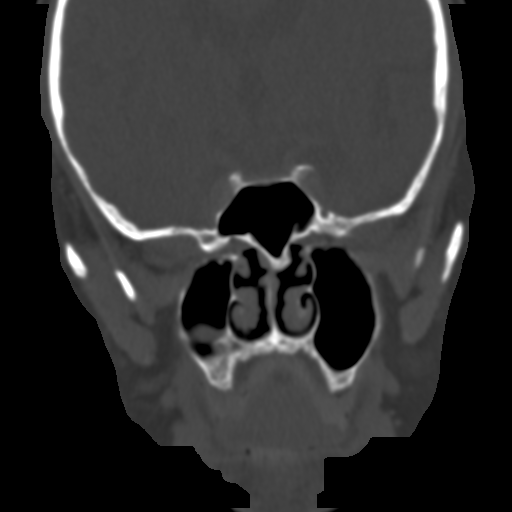
[im 6/18  brain]
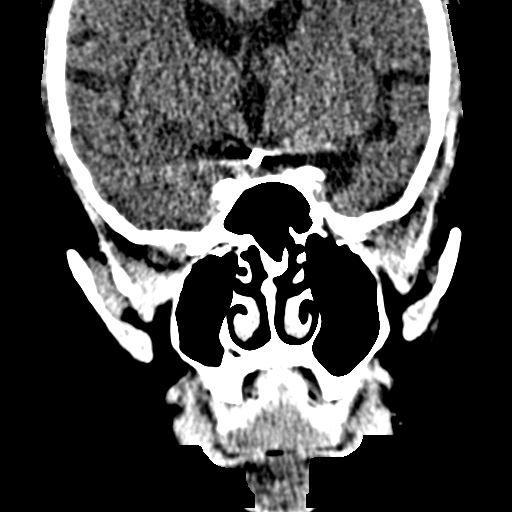
[im 6/18  bone]
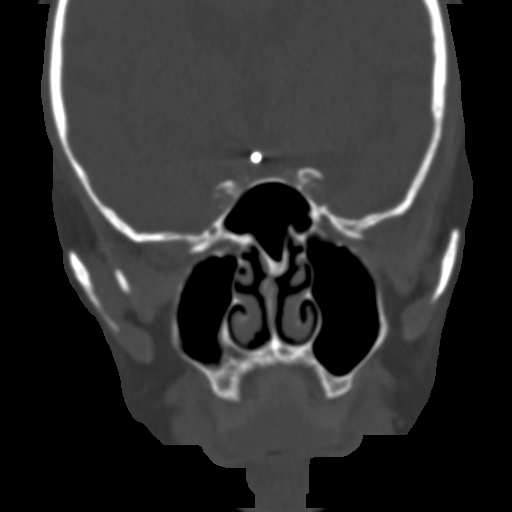
[im 7/18  bone]
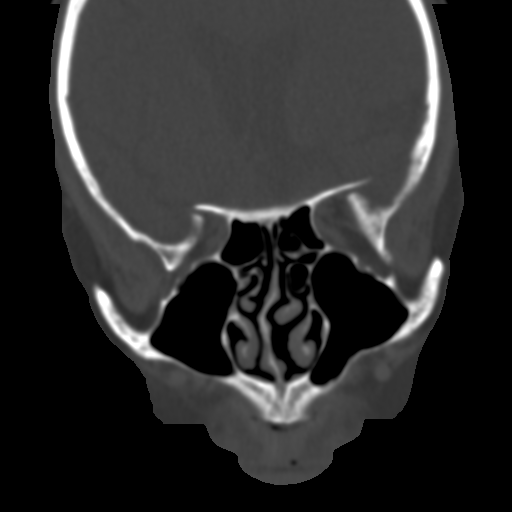
[im 8/18  bone]
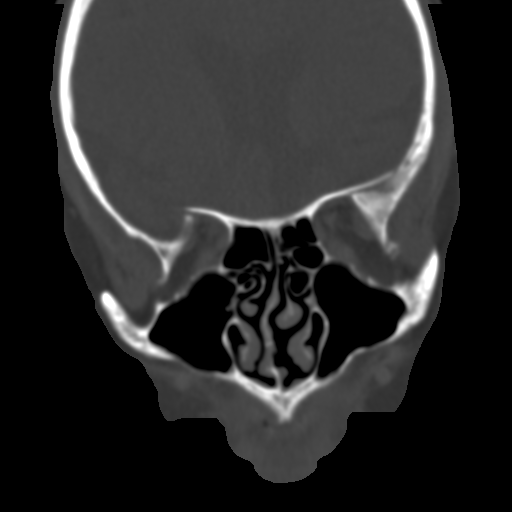
[im 10/18  bone]
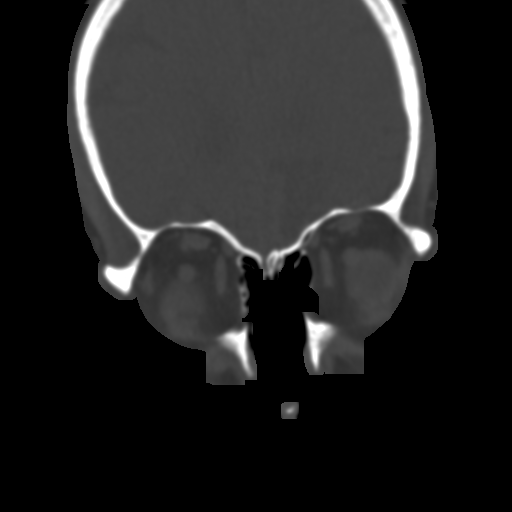
[im 11/18  brain]
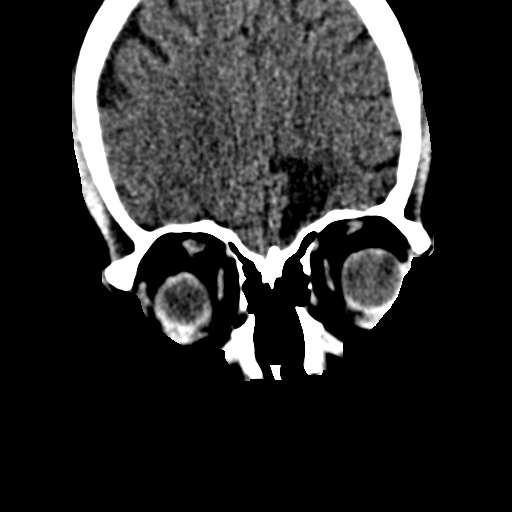
[im 11/18  bone]
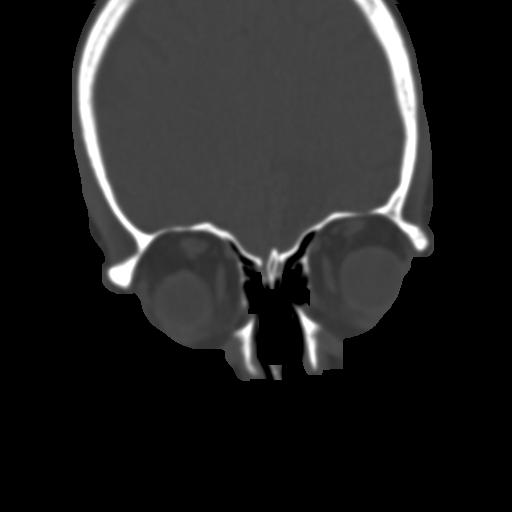
[im 12/18  bone]
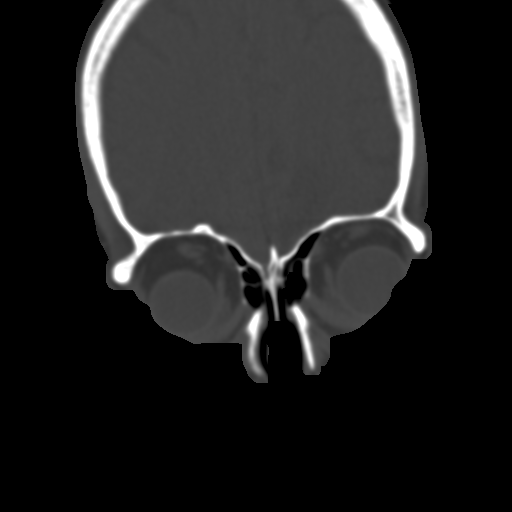
[im 13/18  bone]
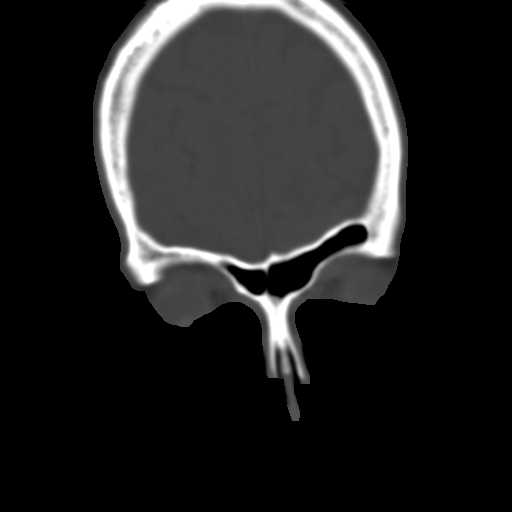
[im 14/18  bone]
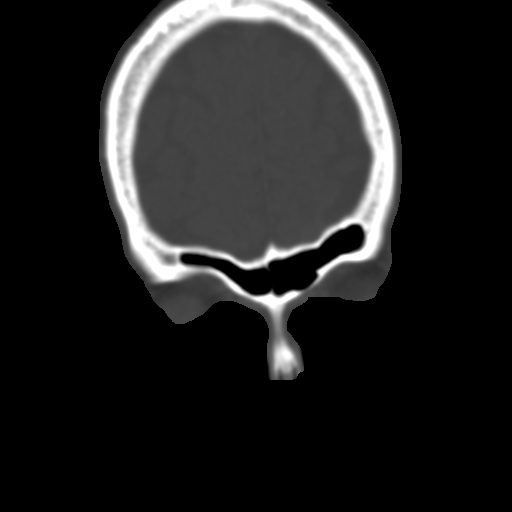
[im 15/18  brain]
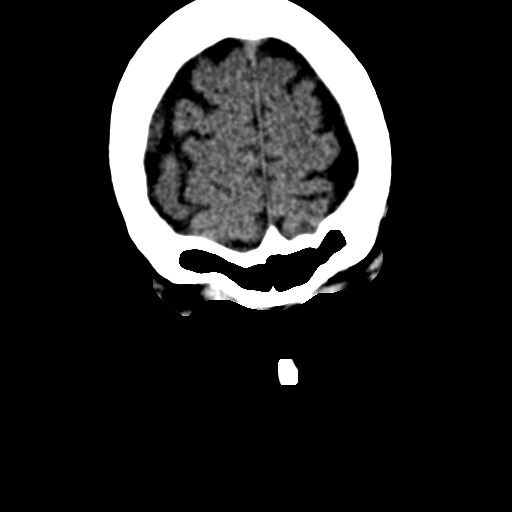
[im 15/18  bone]
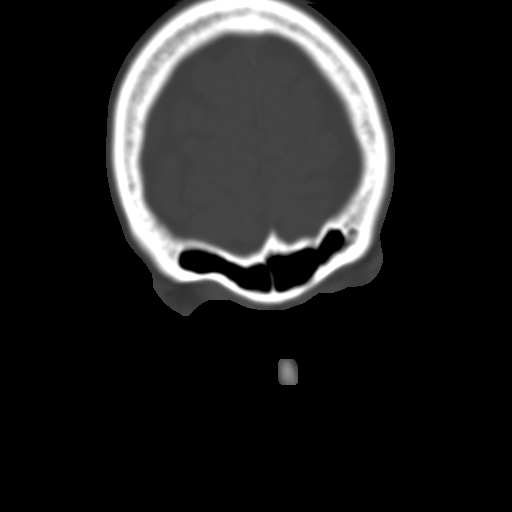
[im 16/18  bone]
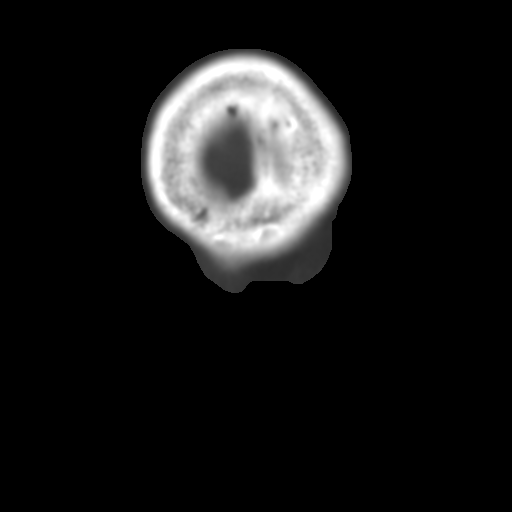
[im 17/18  bone]
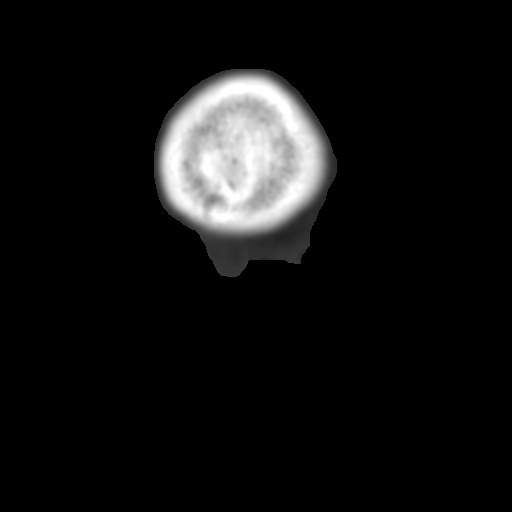

[15 of 19 positions shown; findings below may reference images not displayed]

FINDINGS: There is a right inferior maxillary sinus mucous retention cyst. The
paranasal sinuses including the sphenoid, ethmoid, frontal and left
maxillary sinuses are identified without evidence of disease. No
air-fluid levels are identified. There is rightward deviation of the
nasal septum. The the ostiomeatal complexes are patent bilaterally.
The frontoethmoidal recesses are normal bilaterally. No bony
abnormalities are identified. The visualized portions of the brain
and orbits are unremarkable.
IMPRESSION: 1. No CT evidence of acute or chronic sinusitis.
2. Small right inferior maxillary sinus mucous retention cyst.

## 2015-08-26 ENCOUNTER — Other Ambulatory Visit: Payer: Self-pay | Admitting: Internal Medicine

## 2015-11-27 ENCOUNTER — Encounter: Payer: Self-pay | Admitting: Internal Medicine

## 2016-11-15 ENCOUNTER — Encounter: Payer: Self-pay | Admitting: Physician Assistant

## 2016-11-15 ENCOUNTER — Telehealth: Payer: Self-pay | Admitting: Internal Medicine

## 2016-11-15 ENCOUNTER — Other Ambulatory Visit: Payer: Medicare Other

## 2016-11-15 ENCOUNTER — Encounter: Payer: Self-pay | Admitting: *Deleted

## 2016-11-15 ENCOUNTER — Ambulatory Visit (INDEPENDENT_AMBULATORY_CARE_PROVIDER_SITE_OTHER): Payer: Medicare Other | Admitting: Physician Assistant

## 2016-11-15 VITALS — BP 140/70 | HR 86 | Ht 65.0 in | Wt 179.0 lb

## 2016-11-15 DIAGNOSIS — R1084 Generalized abdominal pain: Secondary | ICD-10-CM

## 2016-11-15 DIAGNOSIS — R197 Diarrhea, unspecified: Secondary | ICD-10-CM | POA: Diagnosis not present

## 2016-11-15 DIAGNOSIS — K219 Gastro-esophageal reflux disease without esophagitis: Secondary | ICD-10-CM

## 2016-11-15 MED ORDER — PANTOPRAZOLE SODIUM 40 MG PO TBEC: 40.0000 mg | DELAYED_RELEASE_TABLET | Freq: Two times a day (BID) | ORAL | 3 refills | Status: DC

## 2016-11-15 NOTE — Patient Instructions (Addendum)
Increase Pantoprazole 40 mg twice a day  Keep a food diary.  Your physician has requested that you go to the basement for lab work before leaving today.  We have made you a follow up with Dr. Carlean Purl on 12/25/16 at 3 pm.

## 2016-11-15 NOTE — Telephone Encounter (Signed)
Spoke with pt. He is needing a letter written stating that he does not have COPD. Looking through his chart, a letter was written in 04/2015. This letter has been printed with today's date on it. It will be mailed to the pt's home address. Nothing further was needed at this time.

## 2016-11-15 NOTE — Progress Notes (Signed)
Chief Complaint: "Stomach cramps", Diarrhea  HPI:   Mr. Cowick is a 75 year old male who presents to clinic today with a complaint of "stomach cramps and diarrhea".  He regularly follows with Dr. Carlean Purl and was last seen in the office on 03/22/15 by Alonza Bogus, PA-C and at that time complained of diarrhea and abdominal cramping. At that time symptoms had resolved and this was thought due to an infectious source. Patient's last colonoscopy was performed on 03/23/2013 and revealed 2 diminutive sessile polyps in transverse colon, moderate diverticulosis in the sigmoid colon, small internal and external hemorrhoids and otherwise normal colonic mucosa. Pathology revealed diminutive tubular adenoma and diminutive sessile serrated adenoma. Repeat colonoscopy was recommended in March 2019.    It should be noted that the patient is a poor historian. Today, the patient describes that for the past 3 months or so he will have 3-4 episodes a week of generalized abdominal cramping which result in diarrhea and will then be relieved. This occurs approximately an hour after eating. He tells me he has "not paid much attention to types of food he is eating and what causes symptoms". The patient does describe reflux symptoms regardless of his daily Pantoprazole 40 mg a daily. He does use Pepcid up to twice daily when necessary for breakthrough symptoms. Patient does describe days of no episodes of pain or diarrhea at all and normal solid bowel movements. He is currently on an antibiotic for an upper respiratory tract infection. He took this for 10 days and is requesting a refill from his PCP due to ongoing symptoms.   Patient denies fever, chills, blood in the stool, melena, recent change in medications, weight loss, fatigue, anorexia, nausea, vomiting, dysphagia or symptoms that awaken him at night.  Past Medical History:  Diagnosis Date  . Adenomatous colon polyp   . Barrett esophagus   . CAD (coronary artery  disease)   . Chronic headaches   . COPD (chronic obstructive pulmonary disease) ([place])   . Coronary atherosclerosis   . Diverticulitis of colon December 2012   Diagnosed by CT scan [facility]  . DM (diabetes mellitus) ([place])   . GERD (gastroesophageal reflux disease)   . Hemorrhoids   . History of kidney stones   . HLD (hyperlipidemia)   . Horseshoe kidney   . HTN (hypertension)   . Melanoma ([place])   . Poor short term memory     Past Surgical History:  Procedure Laterality Date  . CHOLECYSTECTOMY  01/12/2013   Dr. Gerlene Fee  . COLONOSCOPY W/ BIOPSIES AND POLYPECTOMY  09/08/2007   adenomatous polyps, diverticulosis, external hemorrhoids  . CORONARY ARTERY BYPASS GRAFT    . CORONARY STENT PLACEMENT     x 5  . TONSILLECTOMY    . UMBILICAL HERNIA REPAIR    . UPPER GASTROINTESTINAL ENDOSCOPY  10/12/2010   barrett's, fondic gland polyps, duodenitis    Current Outpatient Prescriptions  Medication Sig Dispense Refill  . amitriptyline (ELAVIL) 10 MG tablet Take 50 mg by mouth at bedtime.     [name] aspirin 325 MG EC tablet Take 325 mg by mouth at bedtime.     . butalbital-acetaminophen-caffeine (FIORICET, ESGIC) 50-325-40 MG per tablet Take 1-2 tablets by mouth daily as needed for headache.     . clopidogrel (PLAVIX) 75 MG tablet Take 75 mg by mouth at bedtime.     [name] dextromethorphan-guaiFENesin (MUCINEX DM) 30-600 MG per 12 hr tablet Take 1 tablet by mouth 2 (two) times daily as  needed for cough.    . diclofenac (VOLTAREN) 75 MG EC tablet Take 75 mg by mouth 2 (two) times daily.    [name] ezetimibe (ZETIA) 10 MG tablet Take 10 mg by mouth every morning.     . famotidine (PEPCID) 20 MG tablet Take 20 mg by mouth daily.     . isosorbide mononitrate (IMDUR) 30 MG 24 hr tablet Take 30 mg by mouth at bedtime.     [name] lisinopril (PRINIVIL,ZESTRIL) 10 MG tablet Take 10 mg by mouth daily.    [name] olmesartan (BENICAR) 20 MG tablet Take 1 tablet (20 mg total) by mouth at bedtime. 30  tablet 5  . pantoprazole (PROTONIX) 40 MG tablet TAKE 1 TABLET BY MOUTH DAILY 90 tablet 1  . sertraline (ZOLOFT) 25 MG tablet Take 50 mg by mouth at bedtime.     . temazepam (RESTORIL) 30 MG capsule Take 30 mg by mouth at bedtime as needed.      No current facility-administered medications for this visit.     Allergies as of 11/15/2016 - Review Complete 11/15/2016  Allergen Reaction Noted  . Depakote [divalproex sodium]  12/28/2012  . Other Other (See Comments) 03/23/2013    Family History  Problem Relation Age of Onset  . Colitis      niece  . Diabetes Mother   . Heart disease Mother   . Heart disease Father   . Heart disease Brother   . Rheumatic fever Sister   . Colon cancer Neg Hx     Social History   Social History  . Marital status: Married    Spouse name: N/A  . Number of children: 3  . Years of education: N/A   Occupational History  . retired    Social History Main Topics  . Smoking status: Former Smoker    Packs/day: 1.00    Years: 40.00    Types: Cigarettes    Quit date: 12/31/1975  . Smokeless tobacco: Never Used  . Alcohol use No  . Drug use: No  . Sexual activity: Not on file   Other Topics Concern  . Not on file   Social History Narrative  . No narrative on file    Review of Systems:    Constitutional: No weight loss, fever, chills, weakness or fatigue Cardiovascular: No chest pain, chest pressure or palpitations   Gastrointestinal: See HPI and otherwise negative Neurological: No headache, dizziness or syncope Psychiatric: No history of depression or anxiety   Physical Exam:  Vital signs: BP 140/70   Pulse 86   Ht 5\' 5"  (1.651 m)   Wt 179 lb (81.2 kg)   BMI 29.79 kg/m   Constitutional:   Pleasant Caucasian male appears to be in NAD, Well developed, Well nourished, alert and cooperative Respiratory: Respirations even and unlabored. Lungs clear to auscultation bilaterally.   No wheezes, crackles, or rhonchi.  Cardiovascular: Normal  S1, S2. No MRG. Regular rate and rhythm. No peripheral edema, cyanosis or pallor.  Gastronintestinal:  Soft, nondistended, nontender. No rebound or guarding. Normal bowel sounds. No appreciable masses or hepatomegaly.multiple surgical scars Msk:  Symmetrical without gross deformities. Without edema, no deformity or joint abnormality.  Psychiatric: Demonstrates good judgement and reason without abnormal affect or behaviors.  Independent review of labs completed 10/29/2016 reveal CBC within normal limits and a BMP within normal limits  Assessment: 1. Generalized abdominal cramping: Patient describes this will start approximately an hour after eating certain foods and will result in multiple diarrheal stools and  will then stop, he does have solid bowel movements in between these episodes which occur 3-4 times per week, status post cholecystectomy "years ago", recent increase in reflux; consider gastritis versus H. pylori versus IBS versus other 2. GERD: As above, increase in symptoms, currently using Pantoprazole 40 mg daily and Pepcid daily to twice a day 3. Diarrhea: As above, only after abdominal cramping  Plan: 1. Recent labs including CBC and BMP normal as above, Requested CMP results from patient's recent office visit to his PCP about 2 weeks ago. 2. Ordered H. pylori antibody. 3. Recommend the patient start keeping a food journal to see what types of foods correlate to his symptoms. 4. Increased patient's Pantoprazole to 40 mg twice a day. 5. If patient continues with symptoms after another 2-3 weeks, could consider CT angiogram versus EGD 6. Patient to follow in clinic with Dr. Carlean Purl or myself in the next 3-4 weeks  Ellouise Newer, PA-C [facility] 11/15/2016, 11:03 AM  Cc: [name], PA-C

## 2016-11-18 ENCOUNTER — Other Ambulatory Visit: Payer: Medicare Other

## 2016-11-18 ENCOUNTER — Telehealth: Payer: Self-pay | Admitting: *Deleted

## 2016-11-18 NOTE — Telephone Encounter (Signed)
The office note  from Ellouise Newer PA has been faxed to [facility]  Fax # [phone].  Per the patient's request.

## 2016-11-19 ENCOUNTER — Telehealth: Payer: Self-pay | Admitting: Emergency Medicine

## 2016-11-19 ENCOUNTER — Other Ambulatory Visit: Payer: Medicare Other

## 2016-11-19 DIAGNOSIS — R1084 Generalized abdominal pain: Secondary | ICD-10-CM

## 2016-11-19 DIAGNOSIS — R197 Diarrhea, unspecified: Secondary | ICD-10-CM

## 2016-11-19 NOTE — Telephone Encounter (Signed)
Spoke to Angel Khan in the lab and wrong H pylori test was ordered for blood instead of stool. I corrected the order. Patient informed he will need to collect a stool specimen, he asked if he could be mailed one and then mail it back because he lives far away from the office. I called over to his PCP office in [facility] and they will supply him with a specimen cup but he does need to return the specimen to our lab. Patient verbalized understanding.

## 2016-11-20 NOTE — Progress Notes (Signed)
See note from Dr. Carlean Purl, recall in 2 weeks to check symptoms. Thanks-JLL

## 2016-11-20 NOTE — Progress Notes (Signed)
Agree with Ms. Mort Sawyers evaluation and management.  Note also that patient is on olmesartan which can cause a celiac sprue-like problem.  ? If that could be cause of these sxs.  EGD with duodenal bx would be diagnostic.  If possible have CMA or RN call patient in 2 weeks and see how sxs have been - if persistent as of then would go ahead and  Schedule an EGD and I would take duodenal bxs.  Gatha Mayer, MD, [facility]

## 2016-11-25 ENCOUNTER — Telehealth: Payer: Self-pay

## 2016-11-25 NOTE — Telephone Encounter (Signed)
Spoke with Brach to get a symptom update.  He said he has not had any more problems since he was here, doing well.  He has not brought back his stool for the H. Pylori test yet but he will.

## 2016-11-25 NOTE — Progress Notes (Signed)
FYI:  PJ   Per Dr Carlean Purl; If possible have CMA or RN call patient in 2 weeks and see how sxs have been - if persistent as of then would go ahead and  Schedule an EGD and I would take duodenal bxs.  Gatha Mayer, MD, [facility]

## 2016-11-29 ENCOUNTER — Other Ambulatory Visit: Payer: Medicare Other

## 2016-11-29 ENCOUNTER — Other Ambulatory Visit: Payer: Self-pay | Admitting: Physician Assistant

## 2016-11-29 DIAGNOSIS — R1084 Generalized abdominal pain: Secondary | ICD-10-CM

## 2016-11-29 DIAGNOSIS — R1013 Epigastric pain: Secondary | ICD-10-CM

## 2016-11-29 DIAGNOSIS — R197 Diarrhea, unspecified: Secondary | ICD-10-CM

## 2016-12-01 LAB — H. PYLORI ANTIGEN, STOOL: H PYLORI AG STL: NEGATIVE

## 2016-12-25 ENCOUNTER — Ambulatory Visit (INDEPENDENT_AMBULATORY_CARE_PROVIDER_SITE_OTHER): Payer: Medicare Other | Admitting: Internal Medicine

## 2016-12-25 ENCOUNTER — Encounter: Payer: Self-pay | Admitting: Internal Medicine

## 2016-12-25 VITALS — BP 120/60 | HR 92 | Ht 65.0 in | Wt 181.0 lb

## 2016-12-25 DIAGNOSIS — R1084 Generalized abdominal pain: Secondary | ICD-10-CM

## 2016-12-25 DIAGNOSIS — R197 Diarrhea, unspecified: Secondary | ICD-10-CM

## 2016-12-25 NOTE — Patient Instructions (Signed)
Try taking your pantoprazole once a day and see how you do.     I appreciate the opportunity to care for you. Silvano Rusk, MD, [facility]

## 2016-12-25 NOTE — Progress Notes (Signed)
   Angel Khan 75 y.o. Jan 27, 1941 [id number]  Assessment & Plan:   Encounter Diagnoses  Name Primary?  . Generalized abdominal cramping Yes  . Diarrhea, unspecified type     These sxs seem to be much better since using bid PPI, No weight loss. Not clear why sxs occurred, ? IBS He and I agree trying to go back to qd PPI and if sxs return go back to bid. See me prn o/w.  [id number], Misty A, PA-C    Subjective:   Chief Complaint: f/u abdominal cramps, diarrhea  HPI F/u after 11/17 visit Angel Newer, PA-C - was having generalized abd cramps and some diarrhea 2-3x a week. Labs had been unrevealing. Since then has been much better though he did have some sxs after Oreos recently. No unintentional wgt loss. Remains active.  Medications, allergies, past medical history, past surgical history, family history and social history are reviewed and updated in the EMR.   Review of Systems Wt Readings from Last 3 Encounters:  12/25/16 181 lb (82.1 kg)  11/15/16 179 lb (81.2 kg)  05/04/15 188 lb (85.3 kg)     Objective:   Physical Exam BP 120/60   Pulse 92 Comment: irregular  Ht 5\' 5"  (1.651 m)   Wt 181 lb (82.1 kg)   BMI 30.12 kg/m  NAD Eyes anicteric abd is soft w/ diastasis recti and subtle umbilical hernia, nontender

## 2017-06-12 ENCOUNTER — Ambulatory Visit (INDEPENDENT_AMBULATORY_CARE_PROVIDER_SITE_OTHER): Payer: Medicare Other | Admitting: Physician Assistant

## 2017-06-12 ENCOUNTER — Encounter: Payer: Self-pay | Admitting: Physician Assistant

## 2017-06-12 ENCOUNTER — Telehealth: Payer: Self-pay

## 2017-06-12 VITALS — BP 140/74 | HR 90 | Ht 65.0 in | Wt 183.0 lb

## 2017-06-12 DIAGNOSIS — R197 Diarrhea, unspecified: Secondary | ICD-10-CM | POA: Diagnosis not present

## 2017-06-12 DIAGNOSIS — R109 Unspecified abdominal pain: Secondary | ICD-10-CM

## 2017-06-12 NOTE — Progress Notes (Addendum)
Chief Complaint: " Stomach cramps", Diarrhea  HPI:  [name] is a  76 year old Caucasian male who presents to clinic today with a continued complaint of "stomach cramps and diarrhea". Patient regularly follows with Dr. Carlean Purl.   Patient was last seen in clinic by me on 11/15/16 for these same complaints. He described that for the previous 3 months he had 3-4 episodes a week of generalized abdominal cramping which resulted in diarrhea and would then be relieved. This would occur approximately an hour after eating. He had not been paying attention to what types of food were causing this symptom. He also described reflux symptoms irregardless of his daily Pantoprazole 40 mg daily. Patient also described days of no symptoms at all. At that time patient had an H. pylori antibody ordered and was told to keep a food journal and his Pantoprazole was increased to 40 mg twice a day. Dr. Carlean Purl questioned whether patient may have celiac sprue- like problem related to Olmesartan and recommended an EGD with duodenal biopsy if patient continued with problems.   H. pylori fecal antigen returned 11/29/16 and was negative. At that time patient described being well.   Today, the patient tells me that he has continued with the very same complaints. He has not been keeping a food journal but tells me that it does not seem to matter "what I eat". Again the patient describes that he has episodes about 3-4 times a week after eating he will develop stomach cramping which is severe in nature typically in his epigastrium, rated as an 8-9 /10 which will result in liquid diarrhea eventually and then this pain will be relieved.   Patient denies fever, chills, blood in the stool, melena, weight loss, fatigue, anorexia, nausea, vomiting, heartburn, reflux or symptoms that awaken him at night.  Past Medical History:  Diagnosis Date  . Adenomatous colon polyp   . Barrett esophagus   . CAD (coronary artery disease)   . Chronic  headaches   . COPD (chronic obstructive pulmonary disease) ([place])   . Coronary atherosclerosis   . Diverticulitis of colon December 2012   Diagnosed by CT scan [facility]  . DM (diabetes mellitus) ([place])   . GERD (gastroesophageal reflux disease)   . Hemorrhoids   . History of kidney stones   . HLD (hyperlipidemia)   . Horseshoe kidney   . HTN (hypertension)   . Melanoma ([place])   . Poor short term memory     Past Surgical History:  Procedure Laterality Date  . CHOLECYSTECTOMY  01/12/2013   Dr. Gerlene Fee  . COLONOSCOPY W/ BIOPSIES AND POLYPECTOMY  09/08/2007   adenomatous polyps, diverticulosis, external hemorrhoids  . CORONARY ARTERY BYPASS GRAFT    . CORONARY STENT PLACEMENT     x 5  . TONSILLECTOMY    . UMBILICAL HERNIA REPAIR    . UPPER GASTROINTESTINAL ENDOSCOPY  10/12/2010   barrett's, fondic gland polyps, duodenitis    Current Outpatient Prescriptions  Medication Sig Dispense Refill  . amitriptyline (ELAVIL) 10 MG tablet Take 50 mg by mouth at bedtime.     [name] aspirin 325 MG EC tablet Take 325 mg by mouth at bedtime.     . butalbital-acetaminophen-caffeine (FIORICET, ESGIC) 50-325-40 MG per tablet Take 1-2 tablets by mouth daily as needed for headache.     . clopidogrel (PLAVIX) 75 MG tablet Take 75 mg by mouth at bedtime.     [name] ezetimibe (ZETIA) 10 MG tablet Take 10 mg by mouth  every morning.     . famotidine (PEPCID) 20 MG tablet Take 20 mg by mouth daily.     . isosorbide mononitrate (IMDUR) 30 MG 24 hr tablet Take 30 mg by mouth at bedtime.     [name] losartan (COZAAR) 100 MG tablet Take 100 mg by mouth daily.    . pantoprazole (PROTONIX) 40 MG tablet Take 1 tablet (40 mg total) by mouth 2 (two) times daily. 180 tablet 3  . sertraline (ZOLOFT) 25 MG tablet Take 50 mg by mouth at bedtime.     . vitamin C (ASCORBIC ACID) 500 MG tablet Take 500 mg by mouth daily.     No current facility-administered medications for this visit.     Allergies as of  06/12/2017 - Review Complete 06/12/2017  Allergen Reaction Noted  . Depakote [divalproex sodium]  12/28/2012  . Other Other (See Comments) 03/23/2013  . Propranolol Other (See Comments) 11/15/2016  . Protonix [pantoprazole]  11/15/2016  . Statins Other (See Comments) 11/15/2016    Family History  Problem Relation Age of Onset  . Colitis Unknown        niece  . Diabetes Mother   . Heart disease Mother   . Heart disease Father   . Heart disease Brother   . Rheumatic fever Sister   . Colon cancer Neg Hx     Social History   Social History  . Marital status: Married    Spouse name: N/A  . Number of children: 3  . Years of education: N/A   Occupational History  . retired    Social History Main Topics  . Smoking status: Former Smoker    Packs/day: 1.00    Years: 40.00    Types: Cigarettes    Quit date: 12/31/1975  . Smokeless tobacco: Never Used  . Alcohol use No  . Drug use: No  . Sexual activity: Not on file   Other Topics Concern  . Not on file   Social History Narrative  . No narrative on file    Review of Systems:    Constitutional: No weight loss, fever or chills Cardiovascular: No chest pain  Respiratory: No SOB Gastrointestinal: See HPI and otherwise negative   Physical Exam:  Vital signs: BP 140/74 (BP Location: Right Arm, Patient Position: Sitting, Cuff Size: Normal)   Pulse 90   Ht 5\' 5"  (1.651 m)   Wt 183 lb (83 kg)   BMI 30.45 kg/m   Constitutional:   Pleasant Caucasian male appears to be in NAD, Well developed, Well nourished, alert and cooperative Respiratory: Respirations even and unlabored. Lungs clear to auscultation bilaterally.   No wheezes, crackles, or rhonchi.  Cardiovascular: Normal S1, S2. No MRG. Regular rate and rhythm. No peripheral edema, cyanosis or pallor.  Gastrointestinal:  Soft, nondistended, mild LUQ ttp No rebound or guarding. Normal bowel sounds. No appreciable masses or hepatomegaly. Psychiatric: Demonstrates good  judgement and reason without abnormal affect or behaviors.  No recent labs or imaging.  Assessment: 1. Abdominal cramping: Continues per the patient, 3-4 times a week, typically an hour after eating which will result in liquid diarrhea and then be relieved; per Dr. Carlean Purl question whether this is related to patient's olmesartan and celiac sprue-like process? 2. GERD: This has been somewhat relieved with increasing Pantoprazole 3. Diarrhea: after abdominal cramping episodes above; question relation to celiac sprue-like disease from olmesartan? Versus other  Plan: 1. Scheduled patient for an EGD with duodenal biopsies with Dr. Carlean Purl. Discussed risks, benefits, limitations  and alternatives the patient agrees proceed. This is scheduled in the [place] 2. Patient was advised to hold his Plavix for 5 days prior to his procedure. We' will discuss this with his doctor, Dr. Prince Rome to ensure this is acceptable. 3. Patient encouraged to continue his Pantoprazole 40 mg twice a day until time of EGD. 4. Patient to follow in clinic per recommendations from Dr. Carlean Purl after time of procedure.  Ellouise Newer, PA-C [facility] 06/12/2017, 10:37 AM  Cc: [name], PA-C   Agree with Ms. Aryianna Earwood's evaluation and management. Gatha Mayer, MD, [facility]

## 2017-06-12 NOTE — Patient Instructions (Signed)
If you are age 75 or older, your body mass index should be between 23-30. Your Body mass index is 30.45 kg/m. If this is out of the aforementioned range listed, please consider follow up with your Primary Care Provider.  If you are age 27 or younger, your body mass index should be between 19-25. Your Body mass index is 30.45 kg/m. If this is out of the aformentioned range listed, please consider follow up with your Primary Care Provider.   You have been scheduled for an endoscopy. Please follow written instructions given to you at your visit today. If you use inhalers (even only as needed), please bring them with you on the day of your procedure. Your physician has requested that you go to www.startemmi.com and enter the access code given to you at your visit today. This web site gives a general overview about your procedure. However, you should still follow specific instructions given to you by our office regarding your preparation for the procedure.  You will be contacted by our office prior to your procedure for directions on holding your Plavix.  If you do not hear from our office 1 week prior to your scheduled procedure, please call [phone] to discuss.   Thank you for choosing me and [facility].   Ellouise Newer, PA-C

## 2017-06-12 NOTE — Telephone Encounter (Signed)
  [facility] [address] [place], [place]  [address] Phone:  [phone]   Fax:  [phone]  06/12/2017   RE:      Angel Khan DOB:   September 14, 1941 MRN:   [id number]   Dear Dr. Prince Rome,    We have scheduled the above patient for an endoscopic procedure. Our records show that he is on anticoagulation therapy.   Please advise as to how long the patient may come off his therapy of Plavix prior to the endoscopy procedure, which is scheduled for 06/18/17.  Please fax back/ or route the completed form to [facility] [place] at [phone].   Sincerely,    Thurmon Fair, RMA

## 2017-06-13 NOTE — Telephone Encounter (Signed)
Received instructions per Dr. Prince Rome okay to hold Plavix for five days.  Pt advised this morning.  Note from Dr. Prince Rome scanned to chart.

## 2017-06-18 ENCOUNTER — Encounter: Payer: Self-pay | Admitting: Internal Medicine

## 2017-06-18 ENCOUNTER — Ambulatory Visit (AMBULATORY_SURGERY_CENTER): Payer: Medicare Other | Admitting: Internal Medicine

## 2017-06-18 VITALS — BP 129/70 | HR 77 | Temp 97.5°F | Resp 23 | Ht 65.0 in | Wt 183.0 lb

## 2017-06-18 DIAGNOSIS — K2270 Barrett's esophagus without dysplasia: Secondary | ICD-10-CM | POA: Diagnosis not present

## 2017-06-18 DIAGNOSIS — K2970 Gastritis, unspecified, without bleeding: Secondary | ICD-10-CM

## 2017-06-18 DIAGNOSIS — R1013 Epigastric pain: Secondary | ICD-10-CM

## 2017-06-18 DIAGNOSIS — R109 Unspecified abdominal pain: Secondary | ICD-10-CM

## 2017-06-18 DIAGNOSIS — K2950 Unspecified chronic gastritis without bleeding: Secondary | ICD-10-CM | POA: Diagnosis not present

## 2017-06-18 MED ORDER — SODIUM CHLORIDE 0.9 % IV SOLN: 500.0000 mL | INTRAVENOUS | Status: DC

## 2017-06-18 NOTE — Progress Notes (Signed)
Called to room to assist during endoscopic procedure.  Patient ID and intended procedure confirmed with present staff. Received instructions for my participation in the procedure from the performing physician.  

## 2017-06-18 NOTE — Op Note (Signed)
[facility] Patient Name: Angel Khan Procedure Date: 06/18/2017 9:29 AM MRN: [id number] Endoscopist: Gatha Mayer , MD Age: 76 Referring MD:  Date of Birth: 1941-04-30 Gender: Male Account #: [id number] Procedure:                Upper GI endoscopy Indications:              Epigastric abdominal pain, Diarrhea Medicines:                Propofol per Anesthesia, Monitored Anesthesia Care Procedure:                Pre-Anesthesia Assessment:                           - Prior to the procedure, a History and Physical                            was performed, and patient medications and                            allergies were reviewed. The patient's tolerance of                            previous anesthesia was also reviewed. The risks                            and benefits of the procedure and the sedation                            options and risks were discussed with the patient.                            All questions were answered, and informed consent                            was obtained. Prior Anticoagulants: The patient                            last took Plavix (clopidogrel) 5 days prior to the                            procedure. ASA Grade Assessment: II - A patient                            with mild systemic disease. After reviewing the                            risks and benefits, the patient was deemed in                            satisfactory condition to undergo the procedure.                           After obtaining informed consent, the endoscope was  passed under direct vision. Throughout the                            procedure, the patient's blood pressure, pulse, and                            oxygen saturations were monitored continuously. The                            Model GIF-[id number]) scope was introduced                            through the mouth, and advanced to the second part           of duodenum. The upper GI endoscopy was                            accomplished without difficulty. The patient                            tolerated the procedure well. Scope In: Scope Out: Findings:                 There were esophageal mucosal changes secondary to                            established short-segment Barrett's disease present                            in the distal esophagus. The maximum longitudinal                            extent of these mucosal changes was 2 cm in length.                            Mucosa was biopsied with a cold forceps for                            histology from 38 to 40 cm from the incisors. One                            specimen bottle was sent to pathology. Verification                            of patient identification for the specimen was                            done. Estimated blood loss was minimal.                           Diffuse mild inflammation characterized by erythema                            and granularity was found in the gastric antrum.  Biopsies were taken with a cold forceps for                            histology. Verification of patient identification                            for the specimen was done. Estimated blood loss was                            minimal.                           The examined duodenum was normal. Biopsies for                            histology were taken with a cold forceps for                            evaluation of celiac disease. Verification of                            patient identification for the specimen was done.                            Estimated blood loss was minimal. Complications:            No immediate complications. Estimated Blood Loss:     Estimated blood loss was minimal. Impression:               - Esophageal mucosal changes secondary to                            established short-segment Barrett's disease.                             Biopsied.                           - Gastritis. Biopsied.                           - Normal examined duodenum. Biopsied. Recommendation:           - Patient has a contact number available for                            emergencies. The signs and symptoms of potential                            delayed complications were discussed with the                            patient. Return to normal activities tomorrow.                            Written discharge instructions were provided to the  patient.                           - Resume previous diet.                           - Continue present medications.                           - Await pathology results.                           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

## 2017-06-18 NOTE — Patient Instructions (Signed)
YOU HAD AN ENDOSCOPIC PROCEDURE TODAY AT [facility]:   Refer to the procedure report that was given to you for any specific questions about what was found during the examination.  If the procedure report does not answer your questions, please call your gastroenterologist to clarify.  If you requested that your care partner not be given the details of your procedure findings, then the procedure report has been included in a sealed envelope for you to review at your convenience later.  YOU SHOULD EXPECT: Some feelings of bloating in the abdomen. Passage of more gas than usual.  Walking can help get rid of the air that was put into your GI tract during the procedure and reduce the bloating. If you had a lower endoscopy (such as a colonoscopy or flexible sigmoidoscopy) you may notice spotting of blood in your stool or on the toilet paper. If you underwent a bowel prep for your procedure, you may not have a normal bowel movement for a few days.  Please Note:  You might notice some irritation and congestion in your nose or some drainage.  This is from the oxygen used during your procedure.  There is no need for concern and it should clear up in a day or so.  SYMPTOMS TO REPORT IMMEDIATELY:    Following upper endoscopy (EGD)  Vomiting of blood or coffee ground material  New chest pain or pain under the shoulder blades  Painful or persistently difficult swallowing  New shortness of breath  Fever of 100F or higher  Black, tarry-looking stools  For urgent or emergent issues, a gastroenterologist can be reached at any hour by calling [phone].   DIET:  We do recommend a small meal at first, but then you may proceed to your regular diet.  Drink plenty of fluids but you should avoid alcoholic beverages for 24 hours.  MEDICATIONS:  Continue present medications. Resume Plavix today at previous dose.  Please see hangouts given to you by your recovery nurse. ACTIVITY:  You should  plan to take it easy for the rest of today and you should NOT DRIVE or use heavy machinery until tomorrow (because of the sedation medicines used during the test).    FOLLOW UP: Our staff will call the number listed on your records the next business day following your procedure to check on you and address any questions or concerns that you may have regarding the information given to you following your procedure. If we do not reach you, we will leave a message.  However, if you are feeling well and you are not experiencing any problems, there is no need to return our call.  We will assume that you have returned to your regular daily activities without incident.  If any biopsies were taken you will be contacted by phone or by letter within the next 1-3 weeks.  Please call us at [phone] if you have not heard about the biopsies in 3 weeks.   Thank you for allowing [place] to provide for your healthcare needs today.  SIGNATURES/CONFIDENTIALITY: You and/or your care partner have signed paperwork which will be entered into your electronic medical record.  These signatures attest to the fact that that the information above on your After Visit Summary has been reviewed and is understood.  Full responsibility of the confidentiality of this discharge information lies with you and/or your care-partner.

## 2017-06-18 NOTE — Progress Notes (Signed)
Pt's states no medical or surgical changes since previsit or office visit. 

## 2017-06-18 NOTE — Progress Notes (Signed)
Alert and oriented x3, pleased with MAC, report to RN Sarah 

## 2017-06-19 ENCOUNTER — Telehealth: Payer: Self-pay | Admitting: *Deleted

## 2017-06-19 NOTE — Telephone Encounter (Signed)
  Follow up Call-  Call back number 06/18/2017  Post procedure Call Back phone  # [phone]  Permission to leave phone message Yes  Some recent data might be hidden     No answer, left message.

## 2017-06-19 NOTE — Telephone Encounter (Signed)
  Follow up Call-  Call back number 06/18/2017  Post procedure Call Back phone  # [phone]  Permission to leave phone message Yes  Some recent data might be hidden     Patient questions:  Do you have a fever, pain , or abdominal swelling? No. Pain Score  0 *  Have you tolerated food without any problems? Yes.    Have you been able to return to your normal activities? Yes.    Do you have any questions about your discharge instructions: Diet   No. Medications  No. Follow up visit  No.  Do you have questions or concerns about your Care? No.  Actions: * If pain score is 4 or above: No action needed, pain <4.

## 2017-06-26 NOTE — Progress Notes (Signed)
Biopsies do not show any significant problems - still has Barrett's esophagus but stable and does not need follow-up Have him try dicyclomine 10 mg qac # 90 RF x 2 REV me next available

## 2017-06-27 ENCOUNTER — Other Ambulatory Visit: Payer: Self-pay

## 2017-06-27 MED ORDER — DICYCLOMINE HCL 10 MG PO CAPS: 10.0000 mg | ORAL_CAPSULE | Freq: Three times a day (TID) | ORAL | 2 refills | Status: DC

## 2017-08-14 ENCOUNTER — Telehealth: Payer: Self-pay | Admitting: Internal Medicine

## 2017-08-15 ENCOUNTER — Other Ambulatory Visit: Payer: Self-pay | Admitting: Cardiology

## 2017-08-15 DIAGNOSIS — R0602 Shortness of breath: Secondary | ICD-10-CM

## 2017-08-15 NOTE — Telephone Encounter (Signed)
Error,  Nothing in message.

## 2017-08-18 ENCOUNTER — Telehealth: Payer: Self-pay | Admitting: Internal Medicine

## 2017-08-18 ENCOUNTER — Ambulatory Visit (INDEPENDENT_AMBULATORY_CARE_PROVIDER_SITE_OTHER): Payer: Medicare Other | Admitting: Internal Medicine

## 2017-08-18 DIAGNOSIS — R0602 Shortness of breath: Secondary | ICD-10-CM | POA: Diagnosis not present

## 2017-08-18 LAB — PULMONARY FUNCTION TEST
DL/VA % PRED: 112 %
DL/VA: 4.78 ml/min/mmHg/L
DLCO UNC % PRED: 70 %
DLCO cor % pred: 72 %
DLCO cor: 18.68 ml/min/mmHg
DLCO unc: 18.02 ml/min/mmHg
FEF 25-75 Post: 1.98 L/sec
FEF 25-75 Pre: 1.98 L/sec
FEF2575-%CHANGE-POST: 0 %
FEF2575-%Pred-Post: 110 %
FEF2575-%Pred-Pre: 110 %
FEV1-%CHANGE-POST: 1 %
FEV1-%Pred-Post: 80 %
FEV1-%Pred-Pre: 79 %
FEV1-PRE: 1.97 L
FEV1-Post: 1.99 L
FEV1FVC-%CHANGE-POST: 1 %
FEV1FVC-%PRED-PRE: 111 %
FEV6-%Change-Post: 0 %
FEV6-%Pred-Post: 76 %
FEV6-%Pred-Pre: 75 %
FEV6-PRE: 2.41 L
FEV6-Post: 2.42 L
FEV6FVC-%Pred-Post: 107 %
FEV6FVC-%Pred-Pre: 107 %
FVC-%Change-Post: 0 %
FVC-%PRED-POST: 70 %
FVC-%Pred-Pre: 70 %
FVC-PRE: 2.42 L
FVC-Post: 2.42 L
POST FEV1/FVC RATIO: 82 %
PRE FEV6/FVC RATIO: 100 %
Post FEV6/FVC ratio: 100 %
Pre FEV1/FVC ratio: 81 %
RV % PRED: 94 %
RV: 2.15 L
TLC % pred: 74 %
TLC: 4.5 L

## 2017-08-18 NOTE — Telephone Encounter (Signed)
Please advise Sir, thank you. 

## 2017-08-18 NOTE — Progress Notes (Signed)
PFT done today. 

## 2017-08-19 MED ORDER — DICYCLOMINE HCL 20 MG PO TABS: 20.0000 mg | ORAL_TABLET | Freq: Two times a day (BID) | ORAL | 3 refills | Status: DC

## 2017-08-19 NOTE — Telephone Encounter (Signed)
Spoke with Angel Khan and he was happy to hear he could take the Bentyl differently.  Rx sent in, confirmed pharmacy.

## 2017-08-19 NOTE — Telephone Encounter (Signed)
Yes he can and in future or now can have a 20 mg tablet rx  Sig 20 mg bid # 180 3 rf

## 2017-08-21 ENCOUNTER — Telehealth: Payer: Self-pay | Admitting: Internal Medicine

## 2017-08-21 NOTE — Telephone Encounter (Signed)
lmtcb X1 for pt  

## 2017-08-22 NOTE — Telephone Encounter (Signed)
Claiborne Billings called from Dr. Prince Rome office, contact # [phone]...ert

## 2017-08-22 NOTE — Telephone Encounter (Signed)
Called and spoke with pt. Pt states Dr. Jules Schick ordered PFT. Pt states Dr. Jules Schick states PFT showed some abnormalities.  Per pt Dr. Jules Schick would like for pt to be evaluated further. PFT is in epic.  MW please advise. Thanks.   AVS 05/04/15 For drainage take chlortrimeton (chlorpheniramine) 4 mg every 4 hours available over the counter (may cause drowsiness)   You do not copd in any form and we can write you a letter to that effect  Please see patient coordinator before you leave today  to schedule sinus CT  Please remember to go to the lab   department downstairs for your tests - we will call you with the results when they are available.  If not improving return with all medication/ [facility] list/ formulary     If  Better see your primary doctor for alternative cheaper options for your blood pressure but I would avoid ACE inhbitors which can aggravate coughing

## 2017-08-22 NOTE — Telephone Encounter (Signed)
These "abnormalities" are extremely minimal and would not explain any symptoms so if he's having any resp symptoms needs to regroup here with all meds in hand

## 2017-08-22 NOTE — Telephone Encounter (Signed)
Blair Hailey with Dr Prince Rome' office - states that she will get a message over to Dr Prince Rome for recommendations and either herself or Dr Prince Rome will reach out to our office.   Will send to Dr Melvyn Novas as Juluis Rainier -- Dr Prince Rome may request to speak with you.

## 2017-08-22 NOTE — Telephone Encounter (Signed)
Pt states that our office needs to contact his Cardiologist Dr. Jules Schick - [facility] Pt requests that these results be discussed with his Cards MD Dr Prince Rome today.  Pt feels that his issues are related directly to his heart and that he may have a blocked artery.   Pt states that if we call back to leave a detailed message if he does not answer.  LM for Dr. Prince Rome to contact our office this afternoon regarding this patient.   Will send to Dr Melvyn Novas as Juluis Rainier.

## 2017-08-25 ENCOUNTER — Ambulatory Visit (INDEPENDENT_AMBULATORY_CARE_PROVIDER_SITE_OTHER): Payer: Medicare Other | Admitting: Internal Medicine

## 2017-08-25 ENCOUNTER — Encounter: Payer: Self-pay | Admitting: Internal Medicine

## 2017-08-25 VITALS — BP 128/62 | HR 103 | Ht 65.0 in | Wt 187.0 lb

## 2017-08-25 DIAGNOSIS — K580 Irritable bowel syndrome with diarrhea: Secondary | ICD-10-CM | POA: Diagnosis not present

## 2017-08-25 DIAGNOSIS — K2270 Barrett's esophagus without dysplasia: Secondary | ICD-10-CM | POA: Diagnosis not present

## 2017-08-25 NOTE — Patient Instructions (Signed)
   Stay on current medications.  Let me know if things change for the worse (hope they don't!).  Routine colonoscopy due next year - we plan to send a letter in the Spring.  I appreciate the opportunity to care for you.  Gatha Mayer, MD, [facility]

## 2017-08-25 NOTE — Progress Notes (Signed)
Angel Khan 76 y.o. [birth year] [id number]  Assessment & Plan:   Encounter Diagnoses  Name Primary?  . Irritable bowel syndrome with diarrhea Yes  . Barrett's esophagus without dysplasia     He is improved overall. We'll continue dicyclomine taking 20 mg twice a day. He will follow up as needed otherwise would plan for a routine repeat colonoscopy next year 5 years after his last and he will continue his PPI for GERD and Barrett's.  I appreciate the opportunity to care for this patient. CC: Angel Dolin, PA-C    Subjective:   Chief Complaint:Diarrhea and abdominal pain  HPI Patient is here for follow-up after he had an upper endoscopy set up by Angel Khan, be been having upper abdominal pain and loose stools. The abdominal pain is much better with the institution of dicyclomine. Was taking it 4 times a day but it was hard to really get it all and so he has to take it twice a day and take 20 mg twice a day and has had good relief of abdominal cramps. He still gets an urge to defecate after eating but it is controllable. He does not want to try any other medication she is essentially satisfied with his quality of life where it is at this point. EGD in June showed stable Barrett's esophagus no dysplasia. Wt Readings from Last 3 Encounters:  08/25/17 187 lb (84.8 kg)  06/18/17 183 lb (83 kg)  06/12/17 183 lb (83 kg)    Allergies  Allergen Reactions  . Depakote [Divalproex Sodium]     Hair loss   . Other Other (See Comments)    Hair loss, rash Liquid Magnesium  . Propranolol Other (See Comments)    Muscle cramps  . Protonix [Pantoprazole]   . Statins Other (See Comments)    Muscle cramps   Current Meds  Medication Sig  . amitriptyline (ELAVIL) 10 MG tablet Take 50 mg by mouth at bedtime.   [name] aspirin 325 MG EC tablet Take 325 mg by mouth at bedtime.   . butalbital-acetaminophen-caffeine (FIORICET, ESGIC) 50-325-40 MG per tablet Take 1-2 tablets by mouth  daily as needed for headache.   . clopidogrel (PLAVIX) 75 MG tablet Take 75 mg by mouth at bedtime.   . dicyclomine (BENTYL) 20 MG tablet Take 1 tablet (20 mg total) by mouth 2 (two) times daily.  . DOCOSAHEXAENOIC ACID PO Take 1 g by mouth.  . ezetimibe (ZETIA) 10 MG tablet Take 10 mg by mouth every morning.   . famotidine (PEPCID) 20 MG tablet Take 20 mg by mouth daily.   . isosorbide mononitrate (IMDUR) 30 MG 24 hr tablet Take 30 mg by mouth at bedtime.   [name] losartan (COZAAR) 100 MG tablet Take 100 mg by mouth daily.  . pantoprazole (PROTONIX) 40 MG tablet Take 1 tablet (40 mg total) by mouth 2 (two) times daily.  . sertraline (ZOLOFT) 25 MG tablet Take 50 mg by mouth at bedtime.   . vitamin C (ASCORBIC ACID) 500 MG tablet Take 500 mg by mouth daily.   Past Medical History:  Diagnosis Date  . Adenomatous colon polyp   . Barrett esophagus   . CAD (coronary artery disease)   . Chronic headaches   . COPD (chronic obstructive pulmonary disease) ([place])   . Coronary atherosclerosis   . Diverticulitis of colon December 2012   Diagnosed by CT scan [facility]  . DM (diabetes mellitus) ([place])   . GERD (gastroesophageal reflux  disease)   . Hemorrhoids   . History of kidney stones   . HLD (hyperlipidemia)   . Horseshoe kidney   . HTN (hypertension)   . Melanoma ([place])   . Poor short term memory    Past Surgical History:  Procedure Laterality Date  . CHOLECYSTECTOMY  01/12/2013   Dr. Gerlene Fee  . COLONOSCOPY W/ BIOPSIES AND POLYPECTOMY  09/08/2007   adenomatous polyps, diverticulosis, external hemorrhoids  . CORONARY ARTERY BYPASS GRAFT    . CORONARY STENT PLACEMENT     x 5  . TONSILLECTOMY    . UMBILICAL HERNIA REPAIR    . UPPER GASTROINTESTINAL ENDOSCOPY  10/12/2010   barrett's, fondic gland polyps, duodenitis   Review of Systems As above  Objective:   Physical Exam BP 128/62   Pulse (!) 103   Ht 5\' 5"  (1.651 m)   Wt 187 lb (84.8 kg)   BMI 31.12 kg/m    No acute distress

## 2017-09-29 ENCOUNTER — Ambulatory Visit: Payer: Medicare Other | Admitting: Internal Medicine

## 2017-12-02 ENCOUNTER — Telehealth: Payer: Self-pay | Admitting: Internal Medicine

## 2017-12-02 MED ORDER — DICYCLOMINE HCL 20 MG PO TABS: 20.0000 mg | ORAL_TABLET | Freq: Two times a day (BID) | ORAL | 3 refills | Status: DC

## 2017-12-02 NOTE — Telephone Encounter (Signed)
[facility] mail order pharmacy call center wanting medication dicyclomine 20 mg tablet in 90 day supply faxed to [phone]

## 2019-03-12 ENCOUNTER — Telehealth: Payer: Self-pay | Admitting: Internal Medicine

## 2019-03-12 NOTE — Telephone Encounter (Signed)
Patient is c/o diarrhea after meals.  He will come in on 03/16/19 1:30 to discuss further

## 2019-03-12 NOTE — Telephone Encounter (Signed)
Pt would like a call back to discuss his hernia.

## 2019-03-16 ENCOUNTER — Ambulatory Visit: Payer: Medicare Other | Admitting: Internal Medicine

## 2019-03-16 ENCOUNTER — Ambulatory Visit (INDEPENDENT_AMBULATORY_CARE_PROVIDER_SITE_OTHER): Payer: Medicare Other | Admitting: Internal Medicine

## 2019-03-16 ENCOUNTER — Encounter: Payer: Self-pay | Admitting: Internal Medicine

## 2019-03-16 ENCOUNTER — Other Ambulatory Visit: Payer: Self-pay

## 2019-03-16 VITALS — BP 112/58 | HR 79 | Temp 96.8°F | Ht 65.0 in | Wt 187.0 lb

## 2019-03-16 DIAGNOSIS — T887XXA Unspecified adverse effect of drug or medicament, initial encounter: Secondary | ICD-10-CM

## 2019-03-16 DIAGNOSIS — R1033 Periumbilical pain: Secondary | ICD-10-CM | POA: Diagnosis not present

## 2019-03-16 DIAGNOSIS — Z86010 Personal history of colonic polyps: Secondary | ICD-10-CM | POA: Diagnosis not present

## 2019-03-16 DIAGNOSIS — R197 Diarrhea, unspecified: Secondary | ICD-10-CM | POA: Diagnosis not present

## 2019-03-16 NOTE — Progress Notes (Signed)
Angel Khan 78 y.o. [birth year] [id number]  Assessment & Plan:   Encounter Diagnoses  Name Primary?   Medication side effect Yes   Diarrhea, unspecified type    Periumbilical abdominal pain    Hx of colonic polyps     I think perhaps the azithromycin is causing the abdominal cramps and diarrhea.  It is hard to say for sure but it is reasonable consideration that it is aggravating IBS or just frankly a side effect.  I want him to ask his pulmonologist if there is a different medication he could take that might not cause this given the higher likelihood of GI side effects with azithromycin.  I do not think he has C. difficile given that he has solid stools at times.  He is taking his dicyclomine at least once a day he thinks.  He does have some mild memory disturbance which could be getting in the way.  I do not think there is anything dangerous at play here but I would consider a repeat colonoscopy because of his history of polyps, but we will defer that till later this year and institute a recall planned for September, given the current problems with coronavirus I would not schedule a colonoscopy for him anytime soon.  CC: Edmonia James, PA-C Dr. Merrilee Jansky   Subjective:   Chief Complaint: Abdominal pain and diarrhea  HPI The patient is here with complaints of several weeks of periumbilical cramps and diarrhea.  He has had chronic IBS D managed by dicyclomine, taking it about once a day the 20 mg dose.  However lately he has had these periumbilical cramps and urgent defecation and has had one episode of incontinence.  The bowel movements can be liquid soft or hard.  It does not occur at night.  This is occurred in the setting of reinstitution of azithromycin 3 times a week.  He has a chronic cough and tracheobronchitis and his pulmonary specialist wants him on that chronically.  He had a follow-up in January with them was on clindamycin, and then he had his  azithromycin reinstituted recently.  He was on levofloxacin before that.  As best I can tell it sounds like it is worse since being on the azithromycin in the last few weeks.  As you can see below his weight is up.  He is not describing any bleeding or vomiting.  He has a history of colon polyps and was under consideration for repeat colonoscopy last year but somehow that has not been done.  He had 3 adenomas in 2008 and a diminutive adenoma and a diminutive sessile serrated adenoma/polyp in 2014.  He has had this chronic intermittent diarrhea and IBS D. Wt Readings from Last 3 Encounters:  03/16/19 187 lb (84.8 kg)  08/25/17 187 lb (84.8 kg)  06/18/17 183 lb (83 kg)     Allergies  Allergen Reactions   Depakote [Divalproex Sodium]     Hair loss    Other Other (See Comments)    Hair loss, rash Liquid Magnesium   Propranolol Other (See Comments)    Muscle cramps   Protonix [Pantoprazole]    Statins Other (See Comments)    Muscle cramps   Current Meds  Medication Sig   amitriptyline (ELAVIL) 10 MG tablet Take 50 mg by mouth at bedtime.    aspirin 325 MG EC tablet Take 325 mg by mouth at bedtime.    azithromycin (ZITHROMAX) 250 MG tablet Take 250 mg by mouth 3 (three) times  a week.   butalbital-acetaminophen-caffeine (FIORICET, ESGIC) 50-325-40 MG per tablet Take 1-2 tablets by mouth daily as needed for headache.    clopidogrel (PLAVIX) 75 MG tablet Take 75 mg by mouth at bedtime.    dicyclomine (BENTYL) 20 MG tablet Take 1 tablet (20 mg total) by mouth 2 (two) times daily.   DOCOSAHEXAENOIC ACID PO Take 1 g by mouth.   ezetimibe (ZETIA) 10 MG tablet Take 10 mg by mouth every morning.    famotidine (PEPCID) 20 MG tablet Take 20 mg by mouth daily.    isosorbide mononitrate (IMDUR) 30 MG 24 hr tablet Take 30 mg by mouth at bedtime.    losartan (COZAAR) 100 MG tablet Take 100 mg by mouth daily.   pantoprazole (PROTONIX) 40 MG tablet Take 1 tablet (40 mg total) by mouth 2  (two) times daily.   sertraline (ZOLOFT) 25 MG tablet Take 50 mg by mouth at bedtime.    vitamin C (ASCORBIC ACID) 500 MG tablet Take 500 mg by mouth daily.   Past Medical History:  Diagnosis Date   Adenomatous colon polyp    Barrett esophagus    CAD (coronary artery disease)    Chronic headaches    COPD (chronic obstructive pulmonary disease) ([place])    Coronary atherosclerosis    Diverticulitis of colon December 2012   Diagnosed by CT scan [facility]   DM (diabetes mellitus) ([place])    GERD (gastroesophageal reflux disease)    Hemorrhoids    History of kidney stones    HLD (hyperlipidemia)    Horseshoe kidney    HTN (hypertension)    Melanoma ([facility])    Poor short term memory    Tracheobronchomalacia    Past Surgical History:  Procedure Laterality Date   CHOLECYSTECTOMY  01/12/2013   Dr. Gerlene Fee   COLONOSCOPY W/ BIOPSIES AND POLYPECTOMY  09/08/2007   adenomatous polyps, diverticulosis, external hemorrhoids   CORONARY ARTERY BYPASS GRAFT     CORONARY STENT PLACEMENT     x 5   TONSILLECTOMY     UMBILICAL HERNIA REPAIR     UPPER GASTROINTESTINAL ENDOSCOPY  10/12/2010   barrett's, fondic gland polyps, duodenitis   Social History   Social History Narrative   Married has 3 children he is retired   Former smoker, no alcohol tobacco or drug use now   family history includes Colitis in an other family member; Diabetes in his mother; Heart disease in his brother, father, and mother; Rheumatic fever in his sister.   Review of Systems As per HPI  Objective:   Physical Exam BP (!) 112/58    Pulse 79    Temp (!) 96.8 F (36 C)    Ht 5\' 5"  (1.651 m)    Wt 187 lb (84.8 kg)    BMI 31.12 kg/m  No acute distress Eyes anicteric Lungs clear Normal heart sounds The abdomen is obese soft there is an umbilical scar and above this there is a small supraumbilical hernia that is easily reducible.   Data reviewed see HPI

## 2019-03-16 NOTE — Patient Instructions (Addendum)
To help prevent the possible spread of infection to our patients, communities, and staff; we will be implementing the following measures:  Please only allow one visitor/family member to accompany you to any upcoming appointments with [facility]. If you have any concerns about this please contact our office to discuss prior to the appointment.    Call and check with [facility] to see if they can change your medicine, the azithromycin to something different.   We will put you in for a colonoscopy recall for September 2020.    I appreciate the opportunity to care for you. Silvano Rusk, MD, [facility]

## 2019-03-18 ENCOUNTER — Telehealth: Payer: Self-pay | Admitting: Internal Medicine

## 2019-03-18 MED ORDER — DICYCLOMINE HCL 20 MG PO TABS: 20.0000 mg | ORAL_TABLET | Freq: Two times a day (BID) | ORAL | 0 refills | Status: DC

## 2019-03-18 NOTE — Telephone Encounter (Signed)
Patient informed that this has been sent in.

## 2019-03-18 NOTE — Telephone Encounter (Signed)
Pt was in on Tuesday 3-17 and was going to get a medication sent to his pharmacy. Pt does not recall what the name was and said that it has not been sent yet.

## 2019-03-18 NOTE — Telephone Encounter (Signed)
He said you wanted him to up the dose on one of his medicines to twice a day. Do you recall if that was his pantoprazole or his bentyl. He thought we were going to send in a rx. Thank you.

## 2019-03-18 NOTE — Telephone Encounter (Signed)
Dicyclomine and can refill

## 2019-05-06 ENCOUNTER — Telehealth: Payer: Self-pay | Admitting: Internal Medicine

## 2019-05-06 NOTE — Telephone Encounter (Signed)
Pt has not been seen at office since 05/04/15.  Instructions from pt's last visit with MW are shown below: Instructions from OV with MW 05/04/15: For drainage take chlortrimeton (chlorpheniramine) 4 mg every 4 hours available over the counter (may cause drowsiness)   You do not copd in any form and we can write you a letter to that effect  Please see patient coordinator before you leave today  to schedule sinus CT  Please remember to go to the lab   department downstairs for your tests - we will call you with the results when they are available.  If not improving return with all medication/ [facility] list/ formulary     If  Better see your primary doctor for alternative cheaper options for your blood pressure but I would avoid ACE inhbitors which can aggravate coughing     Called and spoke with pt with pt who states he has had complaints of productive cough and congestion which has been going on x3 months now. Pt is unsure the color of mucus as he is unable to get any mucus up. Pt stated that he has been on different rounds of abx and other meds and was told by PCP that he should follow back up with pulmonary. Stated to pt since we have not seen him since 2016, he would need to start over at our office as a new consult. Pt expressed understanding. Consult has been scheduled for pt with MW 5/20 at 2:30. Pt was provided our new office address. covid screen negative as pt has not had any fever, no recent travelling, and has not been around anyone that has been sick or has been suspected of COVID. Nothing further needed.

## 2019-05-19 ENCOUNTER — Encounter: Payer: Self-pay | Admitting: Internal Medicine

## 2019-05-19 ENCOUNTER — Ambulatory Visit (INDEPENDENT_AMBULATORY_CARE_PROVIDER_SITE_OTHER): Payer: Medicare Other | Admitting: Internal Medicine

## 2019-05-19 ENCOUNTER — Other Ambulatory Visit: Payer: Self-pay

## 2019-05-19 ENCOUNTER — Ambulatory Visit (INDEPENDENT_AMBULATORY_CARE_PROVIDER_SITE_OTHER): Payer: Medicare Other

## 2019-05-19 VITALS — BP 110/60 | HR 75 | Temp 98.1°F | Ht 65.0 in | Wt 187.8 lb

## 2019-05-19 DIAGNOSIS — J45991 Cough variant asthma: Secondary | ICD-10-CM | POA: Diagnosis not present

## 2019-05-19 DIAGNOSIS — R05 Cough: Secondary | ICD-10-CM

## 2019-05-19 DIAGNOSIS — R058 Other specified cough: Secondary | ICD-10-CM

## 2019-05-19 MED ORDER — MOMETASONE FURO-FORMOTEROL FUM 100-5 MCG/ACT IN AERO: 2.0000 | INHALATION_SPRAY | Freq: Two times a day (BID) | RESPIRATORY_TRACT | 11 refills | Status: DC

## 2019-05-19 MED ORDER — MOMETASONE FURO-FORMOTEROL FUM 100-5 MCG/ACT IN AERO: 2.0000 | INHALATION_SPRAY | Freq: Two times a day (BID) | RESPIRATORY_TRACT | 0 refills | Status: DC

## 2019-05-19 MED ORDER — PREDNISONE 10 MG PO TABS: ORAL_TABLET | ORAL | 0 refills | Status: DC

## 2019-05-19 NOTE — Progress Notes (Signed)
Subjective:    Patient ID: Angel Khan, male    DOB: 1941-12-16  MRN: [id number]    Brief patient profile:  59 yowm quit smoking 1977 with cough then but resolved completely until around 2009 when recurred and eval by [facility] (does not remember dx or name of doc but says nothing was found)  Referred to pulmonary clinic  12/21/13  by Dr Carlean Purl for cough on ACEi with nl pfts 05/04/2015    History of Present Illness  12/21/2013 [name] office visit/ Angel Khan on ACEi cc persistent cough x off and on x one year worse in am assoc with dysphagia but no overt sinus complaints with cough productive light beige mucus < 1/2 tsp total daily  does not awaken and does not affect breathing.  Has doe x heavy exertion like using chain saw, has to walk in moderate pace can still do hills. Constant urge to clear throat, mild assoc hoarseness  rec Stop fish oil for now eat more salmon Stop lisinopril and start benicar 20 mg one half daily - this is the only way to tell what your problem is Pantoprazole (protonix) 40 mg   Take 30-60 min before first meal of the day and Pepcid ac  20 mg one bedtime until cough is completely gone x 2 weeks GERD  If better after a month see your primary doctor for follow up and take your samples with you - but if not, return here in a month    02/21/2015 f/u ov/Angel Khan re: recurrent cough x 2 month on losartan now p completely better on benicar Chief Complaint  Patient presents with  . Acute Visit    phlegm in the back of throat, coughing a lot but cannot get the phlegm out.  insidious onset gradually worse sense of throat congestion sev hours p up in am and resolves overnight - never disturbs sleep Total amt of mucus is < one tsp per day >change losartan to benicar    03/22/2015 NP Follow and med review : chronic cough  Pt returns for follow up and med review.  We reviewed all his meds and organized them into a med calendar with pt education.  Appears to be  taking correctly  Feeling better still has some lingering cough with clear mucus.  No fever, hemoptysis, chest pain, orthopnea, edema.  Last ov changed from losartan to benicar. Tolerating well rec Follow med calendar closely and bring to each visit.  Continue on current regimen  May try  Mucinex DM As needed  For cough and congestion    05/04/2015 f/u ov/Angel Khan re: chronic cough/ no med calendar  Chief Complaint  Patient presents with  . Upper Airway Cough Syndrome    PFT performed today.  first noted this flare of recurent cough assoc with    sensation of extra phlegm in throat jan 2016   But really not producing excess or purulent sputum Never wakes from sleep.  Present w/in a few hours of waking  rec For drainage take chlortrimeton (chlorpheniramine) 4 mg every 4 hours available over the counter (may cause drowsiness)  You do not copd in any form and we can write you a letter to that effect Please see patient coordinator before you leave today  to schedule sinus CT Please remember to go to the lab   department downstairs for your tests - we will call you with the results when they are available.   03/08/2019 [facility] note ([facility]) 1.  Tracheobronchomalacia -spirometry from 10/2017 demonstrates FEV1 1.84 /75% predicted.  He continues to have a chronic cough. He is not using his inhalers, Acapella, or azithromycin. I explained to him that the azithromycin treatment is to be long-term and that he should restart this. Refills sent. Also recommended that he restart his inhalers and uses Acapella as directed. Patient voices understanding and agreement with plan. We will see him back in 3 months or sooner if needed. - Spirometry (PFT) - azithromycin (ZITHROMAX Z-PAK) 250 mg tablet; Take one tablet (250 mg dose) by mouth daily. 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  Pulmonary function is well-preserved. He does not have obstructive lung disease. Plan as above.  3. Chronic pansinusitis Following with ENT. Management per their recommendations. Of note, patient is not taking his nasal spray because he says it does not work.    05/19/2019   Angel Khan/ pulmonary ov re-establish re chronic cough  Chief Complaint  Patient presents with  . Pulmonary Consult    here to re-establish care- last seen 2016 and c/o cough x 3-4 months. His cough is prod with white sputum.   Dyspnea:  MMRC2 = can't walk a nl pace on a flat grade s sob but does fine slow and flat   Cough: purely daytime/ min prod mucoid/  was gone for years until Jan 2020  Then "bad sinus infection" but really nose has been running for years and not an obvious trigger Sleeping: sleeping flat on one pillow ok on side  SABA use: started advair 3 weeks prior to OV  May help some  02: none    No obvious day to day or daytime variability or assoc   purulent sputum or mucus plugs or hemoptysis or cp or chest tightness, subjective wheeze or overt   hb symptoms.   Sleeping fine without nocturnal  or early am exacerbation  of respiratory  c/o's or need for noct saba. Also denies any obvious fluctuation of symptoms with weather or environmental changes or other aggravating or alleviating factors except as outlined above   No unusual exposure hx or h/o childhood pna/ asthma or knowledge of premature birth.  Current Allergies, Complete Past Medical History, Past Surgical History, Family History, and Social History were reviewed in [employer] record.  ROS  The following are not active complaints unless bolded Hoarseness, sore throat, dysphagia, dental problems, itching, sneezing,  nasal congestion or discharge of excess mucus or purulent secretions, ear ache,   fever, chills, sweats, unintended wt loss or wt gain, classically pleuritic or exertional cp,  orthopnea pnd or arm/hand swelling  or  leg swelling, presyncope, palpitations, abdominal pain, anorexia, nausea, vomiting, diarrhea  or change in bowel habits or change in bladder habits, change in stools or change in urine, dysuria, hematuria,  rash, arthralgias, visual complaints, headache, numbness, weakness or ataxia or problems with walking or coordination,  change in mood or  memory.        Current Meds - - NOTE:   Unable to verify as accurately reflecting what pt takes     Medication Sig  . amitriptyline (ELAVIL) 10 MG tablet Take 50 mg by mouth at bedtime.   [name] aspirin 325 MG EC tablet Take 325 mg by mouth at bedtime.   . butalbital-acetaminophen-caffeine (FIORICET, ESGIC) 50-325-40 MG per tablet Take 1-2 tablets by mouth daily as needed for headache.   [name]  clopidogrel (PLAVIX) 75 MG tablet Take 75 mg by mouth at bedtime.   [name] ezetimibe (ZETIA) 10 MG tablet Take 10 mg by mouth every morning.   . famotidine (PEPCID) 20 MG tablet Take 20 mg by mouth daily.   . Fluticasone-Salmeterol (ADVAIR DISKUS IN) Inhale 1 puff into the lungs 2 (two) times a day. Unsure of strength  . ipratropium (ATROVENT) 0.03 % nasal spray Place 2 sprays into both nostrils every 12 (twelve) hours.  . isosorbide mononitrate (IMDUR) 30 MG 24 hr tablet Take 30 mg by mouth at bedtime.   [name] losartan (COZAAR) 100 MG tablet Take 100 mg by mouth daily.  . naproxen (NAPROSYN) 500 MG tablet Take 500 mg by mouth 2 (two) times daily with a meal.  . Omega-3 Fatty Acids (FISH OIL) 1000 MG CAPS Take 1,000 mg by mouth daily.  . pantoprazole (PROTONIX) 40 MG tablet Take 1 tablet (40 mg total) by mouth 2 (two) times daily.  . sertraline (ZOLOFT) 25 MG tablet Take 50 mg by mouth at bedtime.   . vitamin C (ASCORBIC ACID) 500 MG tablet Take 500 mg by mouth daily.                Objective:   Physical Exam   02/21/2015        186 >183 03/22/2015 > 05/04/2015   188> 05/19/2019  187    amb stoic wm nad   Vital signs reviewed - Note on arrival 02 sats  96% on RA  HEENT: nl  dentition  and oropharynx. Nl external ear canals without cough reflex - mild/mod bilateral non-specific turbinate edema     NECK :  without JVD/Nodes/TM/ nl carotid upstrokes bilaterally   LUNGS: no acc muscle use,  Nl contour chest which is clear to A and P bilaterally without cough on insp or exp maneuvers   CV:  RRR  no s3 or murmur or increase in P2, and no edema   ABD:  soft and nontender with nl inspiratory excursion in the supine position. No bruits or organomegaly appreciated, bowel sounds nl  MS:  Nl gait/ ext warm without deformities, calf tenderness, cyanosis or clubbing No obvious joint restrictions   SKIN: warm and dry without lesions    NEURO:  alert, approp, nl sensorium with  no motor or cerebellar deficits apparent.        CXR PA and Lateral:   05/19/2019 :    I personally reviewed images and  impression as follows:   mimimal increase markings/ non-specific     I personally reviewed images and agree with radiology impression as follows:   Chest CT w/o contrast  11/30/18  # Lungs: Lungs are clear without nodules, infiltrates or pleural effusions. The amount of pleural fat in the posterior lower lobes is unchanged, slightly greater on the left. No frank pleural thickening.  # Mediastinum: At the level of the sternal notch, the trachea measures 31 mm in anterior to posterior dimension and 19 mm laterally, image 47 lung windows. These measurements are unchanged compared to the prior 2 examinations. The carina and mainstem  bronchi look normal. The distal trachea measures 17 x 18 mm on image 24. Heart size is normal. There are no enlarged mediastinal lymph nodes although there is a small calcified node in the right precarinal region. Prior coronary bypass. Mild aortic  atherosclerosis.  IMPRESSION: Airways are unremarkable and stable since July 2018..          Assessment & Plan:

## 2019-05-19 NOTE — Patient Instructions (Addendum)
Stop advair and start dulera 100 Take 2 puffs first thing in am and then another 2 puffs about 12 hours later.   Work on inhaler technique:  relax and gently blow all the way out then take a nice smooth deep breath back in, triggering the inhaler at same time you start breathing in.  Hold for up to 5 seconds if you can. Blow out thru nose. Rinse and gargle with water when done  Prednisone 10 mg take  4 each am x 2 days,   2 each am x 2 days,  1 each am x 2 days and stop   Pantoprazole (protonix) 40 mg   Take  30-60 min before first meal of the day and Pepcid (famotidine)  20 mg one @  bedtime until return to office - this is the best way to tell whether stomach acid is contributing to your problem.    GERD (REFLUX)  is an extremely common cause of respiratory symptoms just like yours , many times with no obvious heartburn at all.    It can be treated with medication, but also with lifestyle changes including elevation of the head of your bed (ideally with 6 -8inch blocks under the headboard of your bed),  Smoking cessation, avoidance of late meals, excessive alcohol, and avoid fatty foods, chocolate, peppermint, colas, red wine, and acidic juices such as orange juice.  NO MINT OR MENTHOL PRODUCTS SO NO COUGH DROPS  USE SUGARLESS CANDY INSTEAD (Jolley ranchers or Stover's or Life Savers) or even ice chips will also do - the key is to swallow to prevent all throat clearing. NO OIL BASED VITAMINS - use powdered substitutes.  Avoid fish oil when coughing.    Please remember to go to the  x-ray department  for your tests - we will call you with the results when they are available  Please schedule a follow up office visit in 6 weeks, call sooner if needed with all medications /inhalers/ solutions in hand so we can verify exactly what you are taking. This includes all medications from all doctors and over the counters

## 2019-05-20 ENCOUNTER — Encounter: Payer: Self-pay | Admitting: Internal Medicine

## 2019-05-20 ENCOUNTER — Encounter: Payer: Self-pay | Admitting: General Surgery

## 2019-05-20 NOTE — Assessment & Plan Note (Addendum)
Flared Jan 2020 with neg resp to rx by [facility]  - trial off acei 12/21/13 > resolved on benicar - recurred 12/2014 on cozar > resumed benicar  02/23/2015  -med calendar 03/22/2015 > did not bring to pulmonary clinic 05/04/15 as requested  - PFT's 05/04/15 wnl  -CT sinus 05/04/2015  1. No CT evidence of acute or chronic sinusitis. 2. 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 - allergy profile 05/05/2015  >  Eos 0.3/   IgE 610  Grass/ trees/dogs/cats   Upper airway cough syndrome (previously labeled PNDS),  is so named because it's frequently impossible to sort out how much is  CR/sinusitis with freq throat clearing (which can be related to primary GERD)   vs  causing  secondary (" extra esophageal")  GERD from wide swings in gastric pressure that occur with throat clearing, often  promoting self use of mint and menthol lozenges that reduce the lower esophageal sphincter tone and exacerbate the problem further in a cyclical fashion.   These are the same pts (now being labeled as having "irritable larynx syndrome" by some cough centers) who not infrequently have a history of having failed to tolerate ace inhibitors,  dry powder inhalers esp advair  or biphosphonates or report having atypical/extraesophageal reflux symptoms that don't respond to standard doses of PPI  and are easily confused as having aecopd or asthma flares by even experienced allergists/ pulmonologists (myself included).    Will start by d/c advair and rx maximum doses of GERD medications and diet as well as lifestyle changes.  Will need to return in 6 weeks with all meds in hand using a trust but verify approach to confirm accurate Medication  Reconciliation The principal here is that until we are certain that the  patients are doing what we've asked, it makes no sense to ask them to do more.

## 2019-05-20 NOTE — Assessment & Plan Note (Addendum)
Spirometry 03/08/2019  FEV1 2.13 (93%)  Ratio 0.85 with nl f/v loop by report from [facility] chest ? On rx -05/19/2019  After extensive coaching inhaler device,  effectiveness =    75% >  Try dulera 100 2bid and d/c advair   The response to Advair is suggestive of an asthmatic component but because of the upper airway concern probably gets masked so it is reasonable to try low-dose [facility] here if he can take it consistently and master the technique which remains to be seen.   Reviewed: The standardized cough guidelines published in Chest by Lissa Morales in 2006 are still the best available and consist of a multiple step process (up to 12!) , not a single office visit,  and are intended  to address this problem logically,  with an alogrithm dependent on response to empiric treatment at  each progressive step  to determine a specific diagnosis with  minimal addtional testing needed. Therefore if adherence is an issue or can't be accurately verified,  it's very unlikely the standard evaluation and treatment will be successful here.    Furthermore, response to therapy (other than acute cough suppression, which should only be used short term with avoidance of narcotic containing cough syrups if possible), can be a gradual process for which the patient is not likely to  perceive immediate benefit.  Unlike going to an eye doctor where the best perscription is almost always the first one and is immediately effective, this is almost never the case in the management of chronic cough syndromes. Therefore the patient needs to commit up front to consistently adhere to recommendations  for up to 6 weeks of therapy directed at the likely underlying problem(s) before the response can be reasonably evaluated.     Total time devoted to counseling  > 50 % of initial 60 min office visit:  review case with pt/  device teaching which extended face to face time for this visit  discussion of options/alternatives/ personally  creating written customized instructions  in presence of pt  then going over those specific  Instructions directly with the pt including how to use all of the meds but in particular covering each new medication in detail and the difference between the maintenance= "automatic" meds and the prns using an action plan format for the latter (If this problem/symptom => do that organization reading Left to right).  Please see AVS from this visit for a full list of these instructions which I personally wrote for this pt and  are unique to this visit.

## 2019-05-25 ENCOUNTER — Telehealth: Payer: Self-pay | Admitting: Internal Medicine

## 2019-05-25 NOTE — Telephone Encounter (Signed)
Call returned to patient, he states his insurance will be faxing over a form so that we can get dulera added to his insurance med list. He states without the medication on the list it costs $300. Paper work received. Forms filled out and placed in Dr. Morrison Old box for a signature. Will send message to nurse to hold until forms are signed.

## 2019-05-26 NOTE — Telephone Encounter (Signed)
[place]- can this be closed? thanks

## 2019-05-26 NOTE — Telephone Encounter (Signed)
Paper Work has been faxed. Will await f/u from insurance. Will leave message open for now.

## 2019-05-27 NOTE — Telephone Encounter (Signed)
Waiting for insurance follow up.

## 2019-06-02 MED ORDER — BUDESONIDE-FORMOTEROL FUMARATE 80-4.5 MCG/ACT IN AERO: 2.0000 | INHALATION_SPRAY | Freq: Two times a day (BID) | RESPIRATORY_TRACT | 12 refills | Status: DC

## 2019-06-02 NOTE — Telephone Encounter (Signed)
Call made to patient to check status of Dulera being added to his insurance. Made patient aware I was placed on hold for about 20 minutes. He states he has not heard anything. Will attempt to contact at next business day.

## 2019-06-02 NOTE — Telephone Encounter (Signed)
symbicort 80 2bid

## 2019-06-02 NOTE — Telephone Encounter (Signed)
Called and spoke with pt letting him know that insurance denied coverage for [facility] and that he has to try or fail other inhalers which are formulary alternatives before they would consider approving coverage for the [facility] and stated to him that MW wants [place] to send Rx for symbicort to pharmacy for him. Pt expressed understanding. Verified pt's preferred pharmacy and sent Rx for symbicort to pharmacy for pt. Also have removed dulera off of pt's med list. Nothing further needed.

## 2019-06-02 NOTE — Telephone Encounter (Signed)
Received a fax dated from 05/27/19 stating that patient's Dulera 117mcg was denied. Per [facility], medication was denied because "there are other medications on patient's formulary that can treat his condition." Patient needs to try and fail at least 2 formulary alternatives. They are aware that the patient has tried and failed Advair HFA. Patient will also need to try and fail Breo and Symbicort as well.   MW, would you want to switch him to [facility] or Symbicort or file an appeal for the [facility]. Please advise. Thanks!

## 2019-06-07 ENCOUNTER — Other Ambulatory Visit: Payer: Self-pay

## 2019-06-07 NOTE — Telephone Encounter (Signed)
Dicyclomine not refilled, patient said he has plenty and that he's doing well. Computer must have generated this request.

## 2019-07-01 ENCOUNTER — Ambulatory Visit (INDEPENDENT_AMBULATORY_CARE_PROVIDER_SITE_OTHER): Payer: Medicare Other | Admitting: Internal Medicine

## 2019-07-01 ENCOUNTER — Other Ambulatory Visit: Payer: Self-pay

## 2019-07-01 ENCOUNTER — Encounter: Payer: Self-pay | Admitting: Internal Medicine

## 2019-07-01 DIAGNOSIS — R058 Other specified cough: Secondary | ICD-10-CM

## 2019-07-01 DIAGNOSIS — R05 Cough: Secondary | ICD-10-CM

## 2019-07-01 DIAGNOSIS — J45991 Cough variant asthma: Secondary | ICD-10-CM | POA: Diagnosis not present

## 2019-07-01 DIAGNOSIS — R059 Cough, unspecified: Secondary | ICD-10-CM

## 2019-07-01 DIAGNOSIS — J398 Other specified diseases of upper respiratory tract: Secondary | ICD-10-CM | POA: Diagnosis not present

## 2019-07-01 MED ORDER — BUDESONIDE-FORMOTEROL FUMARATE 80-4.5 MCG/ACT IN AERO: 2.0000 | INHALATION_SPRAY | Freq: Two times a day (BID) | RESPIRATORY_TRACT | 0 refills | Status: DC

## 2019-07-01 MED ORDER — MONTELUKAST SODIUM 10 MG PO TABS: 10.0000 mg | ORAL_TABLET | Freq: Every day | ORAL | 11 refills | Status: AC

## 2019-07-01 MED ORDER — MUCINEX DM MAXIMUM STRENGTH 60-1200 MG PO TB12: 1.0000 | ORAL_TABLET | Freq: Two times a day (BID) | ORAL | 11 refills | Status: DC | PRN

## 2019-07-01 NOTE — Progress Notes (Addendum)
Subjective:    Patient ID: Angel Khan, male    DOB: [birth year]  MRN: [id number]    Brief patient profile:  34 yowm quit smoking 1977 with cough then but resolved completely until around 2009 when recurred and eval by [facility] (does not remember dx or name of doc but says nothing was found)  Referred to pulmonary clinic  12/21/13  by Dr Carlean Purl for cough on ACEi with nl pfts 05/04/2015    History of Present Illness  12/21/2013 1st [facility] office visit/ Angel Khan on ACEi cc persistent cough x off and on x one year worse in am assoc with dysphagia but no overt sinus complaints with cough productive light beige mucus < 1/2 tsp total daily  does not awaken and does not affect breathing.  Has doe x heavy exertion like using chain saw, has to walk in moderate pace can still do hills. Constant urge to clear throat, mild assoc hoarseness  rec Stop fish oil for now eat more salmon Stop lisinopril and start benicar 20 mg one half daily - this is the only way to tell what your problem is Pantoprazole (protonix) 40 mg   Take 30-60 min before first meal of the day and Pepcid ac  20 mg one bedtime until cough is completely gone x 2 weeks GERD  If better after a month see your primary doctor for follow up and take your samples with you - but if not, return here in a month    02/21/2015 f/u ov/Angel Khan re: recurrent cough x 2 month on losartan now p completely better on benicar Chief Complaint  Patient presents with  . Acute Visit    phlegm in the back of throat, coughing a lot but cannot get the phlegm out.  insidious onset gradually worse sense of throat congestion sev hours p up in am and resolves overnight - never disturbs sleep Total amt of mucus is < one tsp per day >change losartan to benicar    03/22/2015 NP Follow and med review : chronic cough  Pt returns for follow up and med review.  We reviewed all his meds and organized them into a med calendar with pt education.  Appears to be  taking correctly  Feeling better still has some lingering cough with clear mucus.  No fever, hemoptysis, chest pain, orthopnea, edema.  Last ov changed from losartan to benicar. Tolerating well rec Follow med calendar closely and bring to each visit.  Continue on current regimen  May try  Mucinex DM As needed  For cough and congestion    05/04/2015 f/u ov/Angel Khan re: chronic cough/ no med calendar  Chief Complaint  Patient presents with  . Upper Airway Cough Syndrome    PFT performed today.  first noted this flare of recurent cough assoc with    sensation of extra phlegm in throat jan 2016   But really not producing excess or purulent sputum Never wakes from sleep.  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 He continues to have a chronic cough. He is not using his inhalers, Acapella, or azithromycin. I explained to him that the azithromycin treatment is to be long-term and that he should restart  this. Refills sent. Also recommended that he restart his inhalers and uses Acapella as directed. Patient voices understanding and agreement with plan. We will see him back in 3 months or sooner if needed. - Spirometry (PFT) - azithromycin (ZITHROMAX Z-PAK) 250 mg tablet; Take one tablet (250 mg dose) by mouth daily. 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 Pulmonary function is well-preserved. He does not have obstructive lung disease. Plan as above.  3. Chronic pansinusitis Following with ENT. Management per their recommendations. Of note, patient is not  taking his nasal spray because he says it does not work.    05/19/2019   Angel Khan/ pulmonary ov re-establish re chronic cough  Chief Complaint  Patient presents with  . Pulmonary Consult    here to re-establish care- last seen 2016 and c/o cough x 3-4 months. His cough is prod with white sputum.   Dyspnea:  MMRC2 = can't walk a nl pace on a flat grade s sob but does fine slow and flat   Cough: purely daytime/ min prod mucoid/  was gone for years until Jan 2020  Then "bad sinus infection" but really nose has been running for years and not an obvious trigger Sleeping: sleeping flat on one pillow ok on side  SABA use: started advair 3 weeks prior to OV  May help some  rec Stop advair and start dulera 100 Take 2 puffs first thing in am and then another 2 puffs about 12 hours later.  Work on inhaler technique:  Prednisone 10 mg take  4 each am x 2 days,   2 each am x 2 days,  1 each am x 2 days and stop  Pantoprazole (protonix) 40 mg   Take  30-60 min before first meal of the day and Pepcid (famotidine)  20 mg one @  bedtime until return to office - this is the best way to tell whether stomach acid is contributing to your problem.   GERD diet  Please remember to go to the  x-ray department  for your tests - we will call you with the results when they are available     07/01/2019  f/u ov/Angel Khan re: tracheomalacia =  same cough x years "nothing ever changes it"  Chief Complaint  Patient presents with  . Cough    Brings up very sticky congestion when coughing. Does not feel it has improved since last visit   Dyspnea: MMRC2 = can't walk a nl pace on a flat grade s sob but does fine slow and flat  Cough: no am flare/  Starts w/in 15 min - 30 min p stirring p symb 80 2bid/ using ppi maybe 50 % and not the flutter valve at all / min vol/ mucoid   Sleeping: on cpap / bed is 2 inches up at  head  SABA use: none  02: none  Lots of nasal congestion/ watery d/c on L side > R    No obvious day to day or  daytime variability or assoc purulent sputum or mucus plugs or hemoptysis or cp or chest tightness, subjective wheeze or overt sinus or hb symptoms.   Sleeping on cpap without nocturnal  or early am exacerbation  of respiratory  c/o's or need for noct saba. Also denies any obvious fluctuation of symptoms with weather or environmental changes or other aggravating or alleviating factors except as outlined above  No unusual exposure hx or h/o childhood pna/ asthma or knowledge of premature birth.  Current Allergies, Complete Past Medical History, Past Surgical History, Family History, and Social History were reviewed in [employer] record.  ROS  The following are not active complaints unless bolded Hoarseness, sore throat, dysphagia, dental problems, itching, sneezing,  nasal congestion or discharge of excess mucus or purulent secretions, ear ache,   fever, chills, sweats, unintended wt loss or wt gain, classically pleuritic or exertional cp,  orthopnea pnd or arm/hand swelling  or leg swelling, presyncope, palpitations, abdominal pain, anorexia, nausea, vomiting, diarrhea  or change in bowel habits or change in bladder habits, change in stools or change in urine, dysuria, hematuria,  rash, arthralgias, visual complaints, headache, numbness, weakness or ataxia or problems with walking or coordination,  change in mood or  memory.        Current Meds - - NOTE:   Unable to verify as accurately reflecting what pt takes     Medication Sig  . amitriptyline (ELAVIL) 10 MG tablet Take 50 mg by mouth at bedtime.   [name] aspirin 325 MG EC tablet Take 325 mg by mouth at bedtime.   . budesonide-formoterol (SYMBICORT) 80-4.5 MCG/ACT inhaler Inhale 2 puffs into the lungs 2 (two) times a day.  . butalbital-acetaminophen-caffeine (FIORICET, ESGIC) 50-325-40 MG per tablet Take 1-2 tablets by mouth daily as needed for headache.   . clopidogrel (PLAVIX) 75 MG tablet Take 75 mg by mouth at bedtime.    . dicyclomine (BENTYL) 20 MG tablet Take 20 mg by mouth 2 (two) times a day.  . ezetimibe (ZETIA) 10 MG tablet Take 10 mg by mouth every morning.   . famotidine (PEPCID) 20 MG tablet Take 20 mg by mouth daily.   [name] ipratropium (ATROVENT) 0.03 % nasal spray Place 2 sprays into both nostrils every 12 (twelve) hours.  . isosorbide mononitrate (IMDUR) 30 MG 24 hr tablet Take 30 mg by mouth at bedtime.   [name] losartan (COZAAR) 100 MG tablet Take 100 mg by mouth daily.  [name] MAGNESIUM PO Take 1 tablet by mouth daily.  . naproxen (NAPROSYN) 500 MG tablet Take 500 mg by mouth 2 (two) times daily with a meal.  . Omega-3 Fatty Acids (FISH OIL) 1000 MG CAPS Take 1,000 mg by mouth daily.  . pantoprazole (PROTONIX) 40 MG tablet Take 1 tablet (40 mg total) by mouth 2 (two) times daily.  . Potassium 99 MG TABS Take 1 tablet by mouth daily.  .     . sertraline (ZOLOFT) 25 MG tablet Take 50 mg by mouth at bedtime.   . triamcinolone ointment (KENALOG) 0.5 % APPLY TO AFFECTED AREA TWICE A DAY  . vitamin C (ASCORBIC ACID) 500 MG tablet Take 500 mg by mouth daily.                           Objective:   Physical Exam   amb wm with upper airway pattern cough/ extremely shaky on details of care    Wt Readings from Last 3 Encounters:  07/01/19 186 lb (84.4 kg)  05/19/19 187 lb 12.8 oz (85.2 kg)  03/16/19 187 lb (84.8 kg)  02/21/2015     186    Vital signs reviewed - Note on arrival 02 sats  95% on RA    HEENT: nl dentition, turbinates bilaterally, and oropharynx. Nl external ear canals without cough reflex   NECK :  without  JVD/Nodes/TM/ nl carotid upstrokes bilaterally   LUNGS: no acc muscle use,  Nl contour chest minimal insp/exp rhonchi  bilaterally without cough on insp or exp maneuvers   CV:  RRR  no s3 or murmur or increase in P2, and no edema   ABD:  soft and nontender with nl inspiratory excursion in the supine position. No bruits or organomegaly appreciated, bowel sounds nl  MS:  Nl  gait/ ext warm without deformities, calf tenderness, cyanosis or clubbing No obvious joint restrictions   SKIN: warm and dry without lesions    NEURO:  alert, approp, nl sensorium with  no motor or cerebellar deficits apparent.                 Assessment & Plan:

## 2019-07-01 NOTE — Patient Instructions (Addendum)
We schedule your for a sinus CT and call results  Protonix 40 mg Take 30-60 min before first meal of the day automatically   For cough use mucinex dm 1200mg  every 12 hours and use the flutter valve as much as you can   Singulair 10 mg Take 30-60 min before first meal of the day   Stop the fish oil and eat more fish      Please schedule a follow up office visit in 4 weeks, sooner if needed  with all medications /inhalers/ solutions/device in hand so we can verify exactly what you are taking. This includes all medications from all doctors and over the counters

## 2019-07-02 ENCOUNTER — Encounter: Payer: Self-pay | Admitting: Internal Medicine

## 2019-07-02 DIAGNOSIS — J398 Other specified diseases of upper respiratory tract: Secondary | ICD-10-CM | POA: Insufficient documentation

## 2019-07-02 NOTE — Assessment & Plan Note (Addendum)
Onset of symptoms 2009 - allergy profile 05/05/2015  >  Eos 0.3/   IgE 610  Grass/ trees/dogs/cats Spirometry 10/2017  FEV1 2.13 (93%)  Ratio 0.85 with nl f/v loop by report from [facility] chest ? On rx -05/19/2019     Try dulera 100/symb 80  2bid and d/c advair > no better 07/01/2019  - 07/01/2019  After extensive coaching inhaler device,  effectiveness =    75% so continue symb 80 2bid  - Singulair 10 mg one each pm 07/01/2019 >>>    DDX of  difficult airways management almost all start with A and  include Adherence, Ace Inhibitors, Acid Reflux, Active Sinus Disease, Alpha 1 Antitripsin deficiency, Anxiety masquerading as Airways dz,  ABPA,  Allergy(esp in young), Aspiration (esp in elderly), Adverse effects of meds,  Active smoking or vaping, A bunch of PE's (a small clot burden can't cause this syndrome unless there is already severe underlying pulm or vascular dz with poor reserve) plus two Bs  = Bronchiectasis and Beta blocker use..and one C= CHF   Adherence is always the initial "prime suspect" and is a multilayered concern that requires a "trust but verify" approach in every patient - starting with knowing how to use medications, especially inhalers, correctly, keeping up with refills and understanding the fundamental difference between maintenance and prns vs those medications only taken for a very short course and then stopped and not refilled.  - see hfa teaching - return with all meds in hand using a trust but verify approach to confirm accurate Medication  Reconciliation The principal here is that until we are certain that the  patients are doing what we've asked, it makes no sense to ask them to do more.   Active sinus dz also near top of list > sinus ct and f/u ent next   ? Acid (or non-acid) GERD > always difficult to exclude as up to 75% of pts in some series report no assoc GI/ Heartburn symptoms> rec max (24h)  acid suppression and diet restrictions/ reviewed and instructions given in writing.    ? Alpha one def > unlikely with nl pfts 10/2017  ? Allergy > Note  allergy profile 05/05/2015  >  Eos 0.3/   IgE 610  Grass/ trees/dogs/cats >>   add trial of singulair, esp since he has pnds and continue symb 80 2bid   ? Adverse drug effects > stop fish oil for now as could lead to oil related gerd   ? Anxiety/depression/ cognitive issues >  usually at the bottom of this list of usual suspects but should be much higher on this pt's based on H and P and note already on psychotropics and may interfere with adherence and also interpretation of response or lack thereof to symptom management which can be quite subjective.   ? Bronchiectasis > not seen on CT 12/03/2018 so unlikely now

## 2019-07-02 NOTE — Assessment & Plan Note (Addendum)
See FOB 10/28/17 [facility] but not spirometry nl 10/2017   Hard to reconcile these two findings > needs full pfts p COVID - 19 restrictions have been lifted.    >>> for now emphasized approp use of flutter valve, mucinex dm    I had an extended discussion with the patient reviewing all relevant studies completed to date and  lasting 25 minutes of a 40  minute extended office  visit to sort out refractory non-specific but potentially very serious refractory respiratory symptoms of uncertain and potentially multiple  etiologies.  See device teaching which extended face to face time for this visit   Each maintenance medication was reviewed in detail including most importantly the difference between maintenance and prns and under what circumstances the prns are to be triggered using an action plan format that is not reflected in the computer generated alphabetically organized AVS.    Please see AVS for specific instructions unique to this office visit that I personally wrote and verbalized to the the pt in detail and then reviewed with pt  by my nurse highlighting any changes in therapy/plan of care  recommended at today's visit.

## 2019-07-13 ENCOUNTER — Other Ambulatory Visit: Payer: Self-pay

## 2019-07-13 ENCOUNTER — Ambulatory Visit (INDEPENDENT_AMBULATORY_CARE_PROVIDER_SITE_OTHER)
Admission: RE | Admit: 2019-07-13 | Discharge: 2019-07-13 | Disposition: A | Discharge status: Home / Self Care | Payer: Medicare Other | Source: Ambulatory Visit | Attending: Internal Medicine | Admitting: Internal Medicine

## 2019-07-13 DIAGNOSIS — R05 Cough: Secondary | ICD-10-CM | POA: Diagnosis not present

## 2019-07-13 DIAGNOSIS — R058 Other specified cough: Secondary | ICD-10-CM

## 2019-07-13 DIAGNOSIS — R059 Cough, unspecified: Secondary | ICD-10-CM

## 2019-07-13 NOTE — Progress Notes (Signed)
Spoke with pt and notified of results per Dr. Wert. Pt verbalized understanding and denied any questions. 

## 2019-08-03 ENCOUNTER — Ambulatory Visit (INDEPENDENT_AMBULATORY_CARE_PROVIDER_SITE_OTHER): Payer: Medicare Other | Admitting: Internal Medicine

## 2019-08-03 ENCOUNTER — Other Ambulatory Visit: Payer: Self-pay

## 2019-08-03 ENCOUNTER — Encounter: Payer: Self-pay | Admitting: Internal Medicine

## 2019-08-03 DIAGNOSIS — R0609 Other forms of dyspnea: Secondary | ICD-10-CM | POA: Diagnosis not present

## 2019-08-03 DIAGNOSIS — J398 Other specified diseases of upper respiratory tract: Secondary | ICD-10-CM

## 2019-08-03 DIAGNOSIS — J45991 Cough variant asthma: Secondary | ICD-10-CM

## 2019-08-03 DIAGNOSIS — R0600 Dyspnea, unspecified: Secondary | ICD-10-CM

## 2019-08-03 NOTE — Patient Instructions (Signed)
You should use the flutter valve as much as possible  If you don't feel symbicort helping then ok to stop it   Please schedule a follow up office visit in 6 weeks, call sooner if needed with pfts on return

## 2019-08-03 NOTE — Progress Notes (Signed)
Subjective:    Patient ID: Angel Khan, male    DOB: [birth year]  MRN: [id number]    Brief patient profile:  69 yowm quit smoking 1977 with cough then but resolved completely until around 2009 when recurred and eval by [facility] (does not remember dx or name of doc but says nothing was found)  Referred to pulmonary clinic  12/21/13  by Dr Carlean Purl for cough on ACEi with nl pfts 05/04/2015    History of Present Illness  12/21/2013 1st [facility] office visit/ Maxten Shuler on ACEi cc persistent cough x off and on x one year worse in am assoc with dysphagia but no overt sinus complaints with cough productive light beige mucus < 1/2 tsp total daily  does not awaken and does not affect breathing.  Has doe x heavy exertion like using chain saw, has to walk in moderate pace can still do hills. Constant urge to clear throat, mild assoc hoarseness  rec Stop fish oil for now eat more salmon Stop lisinopril and start benicar 20 mg one half daily - this is the only way to tell what your problem is Pantoprazole (protonix) 40 mg   Take 30-60 min before first meal of the day and Pepcid ac  20 mg one bedtime until cough is completely gone x 2 weeks GERD  If better after a month see your primary doctor for follow up and take your samples with you - but if not, return here in a month    02/21/2015 f/u ov/Tomasina Keasling re: recurrent cough x 2 month on losartan now p completely better on benicar Chief Complaint  Patient presents with  . Acute Visit    phlegm in the back of throat, coughing a lot but cannot get the phlegm out.  insidious onset gradually worse sense of throat congestion sev hours p up in am and resolves overnight - never disturbs sleep Total amt of mucus is < one tsp per day >change losartan to benicar    03/22/2015 NP Follow and med review : chronic cough  Pt returns for follow up and med review.  We reviewed all his meds and organized them into a med calendar with pt education.  Appears to be  taking correctly  Feeling better still has some lingering cough with clear mucus.  No fever, hemoptysis, chest pain, orthopnea, edema.  Last ov changed from losartan to benicar. Tolerating well rec Follow med calendar closely and bring to each visit.  Continue on current regimen  May try  Mucinex DM As needed  For cough and congestion    05/04/2015 f/u ov/Macon Sandiford re: chronic cough/ no med calendar  Chief Complaint  Patient presents with  . Upper Airway Cough Syndrome    PFT performed today.  first noted this flare of recurent cough assoc with    sensation of extra phlegm in throat jan 2016   But really not producing excess or purulent sputum Never wakes from sleep.  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 He continues to have a chronic cough. He is not using his inhalers, Acapella, or azithromycin. I explained to him that the azithromycin treatment is to be long-term and that he should restart  this. Refills sent. Also recommended that he restart his inhalers and uses Acapella as directed. Patient voices understanding and agreement with plan. We will see him back in 3 months or sooner if needed. - Spirometry (PFT) :   FEV1 2.13 (93%) ratio .85  - azithromycin (ZITHROMAX Z-PAK) 250 mg tablet; Take one tablet (250 mg dose) by mouth daily. 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 Pulmonary function is well-preserved. He does not have obstructive lung disease. Plan as above.  3. Chronic pansinusitis Following with ENT. Management per their  recommendations. Of note, patient is not taking his nasal spray because he says it does not work.    05/19/2019   Sumedha Munnerlyn/ pulmonary ov re-establish re chronic cough  Chief Complaint  Patient presents with  . Pulmonary Consult    here to re-establish care- last seen 2016 and c/o cough x 3-4 months. His cough is prod with white sputum.   Dyspnea:  MMRC2 = can't walk a nl pace on a flat grade s sob but does fine slow and flat   Cough: purely daytime/ min prod mucoid/  was gone for years until Jan 2020  Then "bad sinus infection" but really nose has been running for years and not an obvious trigger Sleeping: sleeping flat on one pillow ok on side  SABA use: started advair 3 weeks prior to OV  May help some  rec Stop advair and start dulera 100 Take 2 puffs first thing in am and then another 2 puffs about 12 hours later.  Work on inhaler technique:  Prednisone 10 mg take  4 each am x 2 days,   2 each am x 2 days,  1 each am x 2 days and stop  Pantoprazole (protonix) 40 mg   Take  30-60 min before first meal of the day and Pepcid (famotidine)  20 mg one @  bedtime until return to office - this is the best way to tell whether stomach acid is contributing to your problem.   GERD diet  Please remember to go to the  x-ray department  for your tests - we will call you with the results when they are available     07/01/2019  f/u ov/Emit Kuenzel re: tracheomalacia =  same cough x years "nothing ever changes it"  Chief Complaint  Patient presents with  . Cough    Brings up very sticky congestion when coughing. Does not feel it has improved since last visit  Dyspnea: MMRC2 = can't walk a nl pace on a flat grade s sob but does fine slow and flat  Cough: no am flare/  Starts w/in 15 min - 30 min p stirring p symb 80 2bid/ using ppi maybe 50 % and not the flutter valve at all / min vol/ mucoid   Sleeping: on cpap / bed is 2 inches up at  head  SABA use: none  02: none  Lots of nasal congestion/ watery d/c on L  side > R  rec We schedule your for a sinus CT neg  Protonix 40 mg Take 30-60 min before first meal of the day automatically  For cough use mucinex dm 1200mg  every 12 hours and use the flutter valve as much as you can  Singulair 10 mg Take 30-60 min before first meal of the day  Stop the fish oil and eat more fish    08/03/2019  f/u ov/Leanah Kolander re: variable hx of cough x years, doe x 2019 / nasal symptoms better on singulair but not really helping the cough  Chief Complaint  Patient presents with  . Follow-up    Breathing is overall doing well. He still has prod cough with clear to white, sticky sputum.   Dyspnea:  Worse gradually x one year   - did chemical stress only in [facility] - does not appear to correlate with cough   Cough: about the same daytime x years  Sleeping: cpap and 2 in bed blocks no resp symptoms  SABA use: none  02: none  No better with mucinex / can't tell any benefit from prednisone    No obvious day to day or daytime variability or assoc  purulent sputum or mucus plugs or hemoptysis or cp or chest tightness, subjective wheeze or overt sinus or hb symptoms.   Sleeping as above without nocturnal  or early am exacerbation  of respiratory  c/o's or need for noct saba. Also denies any obvious fluctuation of symptoms with weather or environmental changes or other aggravating or alleviating factors except as outlined above   No unusual exposure hx or h/o childhood pna/ asthma or knowledge of premature birth.  Current Allergies, Complete Past Medical History, Past Surgical History, Family History, and Social History were reviewed in [employer] record.  ROS  The following are not active complaints unless bolded Hoarseness, sore throat, dysphagia, dental problems, itching, sneezing,  nasal congestion or discharge of excess mucus or purulent secretions, ear ache,   fever, chills, sweats, unintended wt loss or wt gain, classically pleuritic or exertional cp,   orthopnea pnd or arm/hand swelling  or leg swelling, presyncope, palpitations, abdominal pain, anorexia, nausea, vomiting, diarrhea  or change in bowel habits or change in bladder habits, change in stools or change in urine, dysuria, hematuria,  rash, arthralgias, visual complaints, headache, numbness, weakness or ataxia or problems with walking or coordination,  change in mood or  memory.        Current Meds  Medication Sig  . aspirin 325 MG EC tablet Take 325 mg by mouth at bedtime.   . budesonide-formoterol (SYMBICORT) 80-4.5 MCG/ACT inhaler Inhale 2 puffs into the lungs 2 (two) times a day.  . clopidogrel (PLAVIX) 75 MG tablet Take 75 mg by mouth at bedtime.   . dicyclomine (BENTYL) 20 MG tablet Take 20 mg by mouth 2 (two) times a day.  . ezetimibe (ZETIA) 10 MG tablet Take 10 mg by mouth every morning.   . famotidine (PEPCID) 20 MG tablet Take 20 mg by mouth daily.   . isosorbide mononitrate (IMDUR) 30 MG 24 hr tablet Take 30 mg by mouth at bedtime.   [name] losartan (COZAAR) 100 MG tablet Take 100 mg by mouth daily.  [name] MAGNESIUM PO Take 1 tablet by mouth daily.  . montelukast (SINGULAIR) 10 MG tablet Take 1 tablet (10 mg total) by mouth at bedtime.  . pantoprazole (PROTONIX) 40 MG tablet Take 1 tablet (40 mg total) by mouth 2 (two) times daily.  . Potassium 99 MG TABS Take 1 tablet by mouth daily.  [name] Respiratory Therapy Supplies (FLUTTER) DEVI by Does not apply route as directed.  . sertraline (ZOLOFT) 25 MG tablet Take 50 mg by mouth at bedtime.   . vitamin C (ASCORBIC ACID) 500 MG tablet Take 500 mg by mouth daily.  Objective:   Physical Exam   amb obese wm seems easily aggravated with details of care/ response to rx questions   08/03/2019          188   07/01/19 186 lb (84.4 kg)  05/19/19 187 lb 12.8 oz (85.2 kg)  03/16/19 187 lb (84.8 kg)  02/21/2015       186    Vital signs reviewed - Note on arrival 02 sats  95% on RA       HEENT: nl  dentition, turbinates bilaterally, and oropharynx. Nl external ear canals without cough reflex   NECK :  without JVD/Nodes/TM/ nl carotid upstrokes bilaterally   LUNGS: no acc muscle use,  Nl contour chest with minimal exp rhonchi mostly upper airway and better with plm    without cough on insp or exp maneuvers   CV:  RRR  no s3 or murmur or increase in P2, and no edema   ABD:  Quit obese / soft and nontender with nl inspiratory excursion in the supine position. No bruits or organomegaly appreciated, bowel sounds nl  MS:  Nl gait/ ext warm without deformities, calf tenderness, cyanosis or clubbing No obvious joint restrictions   SKIN: warm and dry without lesions    NEURO:  alert, approp, nl sensorium with  no motor or cerebellar deficits apparent.                 Assessment & Plan:

## 2019-08-04 ENCOUNTER — Encounter: Payer: Self-pay | Admitting: Internal Medicine

## 2019-08-04 DIAGNOSIS — R0600 Dyspnea, unspecified: Secondary | ICD-10-CM | POA: Insufficient documentation

## 2019-08-04 DIAGNOSIS — R0609 Other forms of dyspnea: Secondary | ICD-10-CM | POA: Insufficient documentation

## 2019-08-04 NOTE — Assessment & Plan Note (Signed)
Onset of symptoms 2009 - allergy profile 05/05/2015  >  Eos 0.3/   IgE 610  Grass/ trees/dogs/cats Spirometry 10/2017  FEV1 2.13 (93%)  Ratio 0.85 with nl f/v loop by report from [facility] chest ? On rx -05/19/2019     Try dulera 100/symb 80  2bid and d/c advair > no better 07/01/2019  - 07/01/2019  After extensive coaching inhaler device,  effectiveness =    75% so continue symb 80 2bid  - Singulair 10 mg one each pm 07/01/2019 >>> helped nasal symptoms but not cough  - 08/03/2019 try off symbicort as not convinced it helps and return for full pfts off symbicort

## 2019-08-04 NOTE — Assessment & Plan Note (Signed)
Onset 2019 with neg cards w/u [facility] - 08/03/2019   Walked RA  2 laps @  approx 266ft each @ fast pace  stopped due to  End of study, min sob, sats 97%   Not able to reproduce symptoms in office, ct chest reviewed from 12/03/18 s explanation > needs full pfts then maybe cpst   I had an extended discussion with the patient   reviewing all relevant studies completed to date and  lasting 15 to 20 minutes of a 25 minute visit  which included directly observing ambulatory 02 saturation study documented in a/p section of  today's  office note.  Each maintenance medication was reviewed in detail including most importantly the difference between maintenance and prns and under what circumstances the prns are to be triggered using an action plan format that is not reflected in the computer generated alphabetically organized AVS.     Please see AVS for specific instructions unique to this visit that I personally wrote and verbalized to the the pt in detail and then reviewed with pt  by my nurse highlighting any changes in therapy recommended at today's visit .

## 2019-08-04 NOTE — Assessment & Plan Note (Signed)
See FOB 10/28/17 Javaid but  spirometry nl 10/2017 and 03/08/19  Clinically this fits with his airway issue but surpised pfts nl and really needs full set with f/v loop to complete the w/u  In meantime encourage plb/ flutter valve rx

## 2019-08-19 ENCOUNTER — Telehealth: Payer: Self-pay | Admitting: Internal Medicine

## 2019-08-19 NOTE — Telephone Encounter (Signed)
No need to take the symbicort if not helping Needs ov asap with pfts - nothing else to offer but happy to see as workin if condition worsens while awaiting pfts - must bring all active meds with him

## 2019-08-19 NOTE — Telephone Encounter (Signed)
Called the patient and advised him of Dr. Gustavus Bryant response. Told the patient that a covid test screen will be required prior to his having the PFT. Patient asked if it can be done closer to where he lives so he does not have to come all the way to [place]. I told him his request will be forwarded to the person scheduling, but there are limited locations were the tests are done.   Patient voiced understanding. Nothing further needed at this time.  Message above routed to [facility] for scheduling of PFT and OV with Dr. Melvyn Novas same day.

## 2019-08-19 NOTE — Telephone Encounter (Signed)
Called and spoke to pt. Pt states he completed the Symbicort a couple weeks ago and did not notice an improvement in his breathing while taking it. Pt states his SOB and prod cough with white mucus are both unchanged since last OV. Pt is requesting recs until he is seen again. Pt denies CP/tightness, f/c/s. PFT hasnt been scheduled as of yet.   Dr. Melvyn Novas please advise. Thanks.

## 2019-08-20 NOTE — Telephone Encounter (Signed)
No appts scheduled as of yet. Will leave encounter open for follow up.

## 2019-08-23 NOTE — Telephone Encounter (Signed)
Scheduled pft 09/08/2019-pr

## 2019-08-23 NOTE — Telephone Encounter (Signed)
At this time there is no appointments scheduled for Patient.

## 2019-08-30 ENCOUNTER — Other Ambulatory Visit: Payer: Self-pay | Admitting: Internal Medicine

## 2019-09-02 ENCOUNTER — Telehealth: Payer: Self-pay | Admitting: Internal Medicine

## 2019-09-02 NOTE — Telephone Encounter (Signed)
Call returned to patient, he states [facility] has him scheduled for a covid test tomorrow. We have him scheduled for a covid test this weekend. He is wanting to know if he can cancel one so he does not have to get two tests one day apart.   Spoke with RN BJ, she reports he will need to get [place] the results, an actual paper copy of the covid test or he will not be able to do his PFT.   LMTCB with patient. Patient will have to provide an actual paper copy of the results of his covid test or have [facility] to fax the results prior to his appt. Be sure sure to give him our fax number when he calls back. Will leave in triage and try back tomorrow.

## 2019-09-03 NOTE — Telephone Encounter (Signed)
Pt returned call and I let him know that he needed to bring in the actual paper copy of covid test in order to be able to do pft w/us.Hillery Hunter

## 2019-09-04 ENCOUNTER — Other Ambulatory Visit (HOSPITAL_COMMUNITY): Payer: Medicare Other

## 2019-09-08 ENCOUNTER — Ambulatory Visit (INDEPENDENT_AMBULATORY_CARE_PROVIDER_SITE_OTHER): Payer: Medicare Other | Admitting: Internal Medicine

## 2019-09-08 ENCOUNTER — Other Ambulatory Visit: Payer: Self-pay

## 2019-09-08 ENCOUNTER — Encounter: Payer: Self-pay | Admitting: Internal Medicine

## 2019-09-08 ENCOUNTER — Ambulatory Visit (INDEPENDENT_AMBULATORY_CARE_PROVIDER_SITE_OTHER): Payer: Medicare Other

## 2019-09-08 DIAGNOSIS — R0609 Other forms of dyspnea: Secondary | ICD-10-CM

## 2019-09-08 DIAGNOSIS — J45991 Cough variant asthma: Secondary | ICD-10-CM | POA: Diagnosis not present

## 2019-09-08 DIAGNOSIS — J398 Other specified diseases of upper respiratory tract: Secondary | ICD-10-CM | POA: Diagnosis not present

## 2019-09-08 DIAGNOSIS — R0600 Dyspnea, unspecified: Secondary | ICD-10-CM

## 2019-09-08 DIAGNOSIS — R05 Cough: Secondary | ICD-10-CM

## 2019-09-08 DIAGNOSIS — R058 Other specified cough: Secondary | ICD-10-CM

## 2019-09-08 LAB — PULMONARY FUNCTION TEST
DL/VA % pred: 115 %
DL/VA: 4.67 ml/min/mmHg/L
DLCO unc % pred: 93 %
DLCO unc: 19.99 ml/min/mmHg
FEF 25-75 Post: 2.21 L/sec
FEF 25-75 Pre: 1.99 L/sec
FEF2575-%Change-Post: 11 %
FEF2575-%Pred-Post: 128 %
FEF2575-%Pred-Pre: 115 %
FEV1-%Change-Post: 3 %
FEV1-%Pred-Post: 82 %
FEV1-%Pred-Pre: 80 %
FEV1-Post: 2 L
FEV1-Pre: 1.93 L
FEV1FVC-%Change-Post: 0 %
FEV1FVC-%Pred-Pre: 112 %
FEV6-%Change-Post: 3 %
FEV6-%Pred-Post: 78 %
FEV6-%Pred-Pre: 75 %
FEV6-Post: 2.46 L
FEV6-Pre: 2.38 L
FEV6FVC-%Pred-Post: 107 %
FEV6FVC-%Pred-Pre: 107 %
FVC-%Change-Post: 3 %
FVC-%Pred-Post: 72 %
FVC-%Pred-Pre: 70 %
FVC-Post: 2.46 L
FVC-Pre: 2.38 L
Post FEV1/FVC ratio: 81 %
Post FEV6/FVC ratio: 100 %
Pre FEV1/FVC ratio: 81 %
Pre FEV6/FVC Ratio: 100 %
RV % pred: 104 %
RV: 2.41 L
TLC % pred: 89 %
TLC: 5.4 L

## 2019-09-08 LAB — D-DIMER, QUANTITATIVE: D-Dimer, Quant: 0.41 mcg/mL FEU (ref <0.50)

## 2019-09-08 NOTE — Progress Notes (Signed)
PFT done today. 

## 2019-09-08 NOTE — Patient Instructions (Addendum)
Weight control is simply a matter of calorie balance which needs to be tilted in your favor by eating less and exercising more.  To get the most out of exercise, you need to be continuously aware that you are short of breath, but never out of breath, for 30 minutes daily. As you improve, it will actually be easier for you to do the same amount of exercise  in  30 minutes so always push to the level where you are short of breath.    Please remember to go to the lab and x-ray department   for your tests - we will call you with the results when they are available.  Call if not better in 4 weeks to schedule cpst if not improving   For cough next step would be allergy evaluation > call for referral if desired

## 2019-09-08 NOTE — Progress Notes (Addendum)
Subjective:    Patient ID: Angel Khan, male    DOB: [birth year]  MRN: [id number]    Brief patient profile:  69 yowm quit smoking 1977 with cough then but resolved completely until around 2009 when recurred and eval by [facility] (does not remember dx or name of doc but says nothing was found)  Referred to pulmonary clinic  12/21/13  by Dr Carlean Purl for cough on ACEi with nl pfts 05/04/2015    History of Present Illness  12/21/2013 1st [facility] office visit/ Angel Khan on ACEi cc persistent cough x off and on x one year worse in am assoc with dysphagia but no overt sinus complaints with cough productive light beige mucus < 1/2 tsp total daily  does not awaken and does not affect breathing.  Has doe x heavy exertion like using chain saw, has to walk in moderate pace can still do hills. Constant urge to clear throat, mild assoc hoarseness  rec Stop fish oil for now eat more salmon Stop lisinopril and start benicar 20 mg one half daily - this is the only way to tell what your problem is Pantoprazole (protonix) 40 mg   Take 30-60 min before first meal of the day and Pepcid ac  20 mg one bedtime until cough is completely gone x 2 weeks GERD  If better after a month see your primary doctor for follow up and take your samples with you - but if not, return here in a month    02/21/2015 f/u ov/Angel Khan re: recurrent cough x 2 month on losartan now p completely better on benicar Chief Complaint  Patient presents with  . Acute Visit    phlegm in the back of throat, coughing a lot but cannot get the phlegm out.  insidious onset gradually worse sense of throat congestion sev hours p up in am and resolves overnight - never disturbs sleep Total amt of mucus is < one tsp per day >change losartan to benicar    03/22/2015 NP Follow and med review : chronic cough  Pt returns for follow up and med review.  We reviewed all his meds and organized them into a med calendar with pt education.  Appears to be  taking correctly  Feeling better still has some lingering cough with clear mucus.  No fever, hemoptysis, chest pain, orthopnea, edema.  Last ov changed from losartan to benicar. Tolerating well rec Follow med calendar closely and bring to each visit.  Continue on current regimen  May try  Mucinex DM As needed  For cough and congestion    05/04/2015 f/u ov/Angel Khan re: chronic cough/ no med calendar  Chief Complaint  Patient presents with  . Upper Airway Cough Syndrome    PFT performed today.  first noted this flare of recurent cough assoc with    sensation of extra phlegm in throat jan 2016   But really not producing excess or purulent sputum Never wakes from sleep.  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 He continues to have a chronic cough. He is not using his inhalers, Acapella, or azithromycin. I explained to him that the azithromycin treatment is to be long-term and that he should restart  this. Refills sent. Also recommended that he restart his inhalers and uses Acapella as directed. Patient voices understanding and agreement with plan. We will see him back in 3 months or sooner if needed. - Spirometry (PFT) :   FEV1 2.13 (93%) ratio .85  - azithromycin (ZITHROMAX Z-PAK) 250 mg tablet; Take one tablet (250 mg dose) by mouth daily. 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 Pulmonary function is well-preserved. He does not have obstructive lung disease. Plan as above.  3. Chronic pansinusitis Following with ENT. Management per their  recommendations. Of note, patient is not taking his nasal spray because he says it does not work.    05/19/2019   Angel Khan/ pulmonary ov re-establish re chronic cough  Chief Complaint  Patient presents with  . Pulmonary Consult    here to re-establish care- last seen 2016 and c/o cough x 3-4 months. His cough is prod with white sputum.   Dyspnea:  MMRC2 = can't walk a nl pace on a flat grade s sob but does fine slow and flat   Cough: purely daytime/ min prod mucoid/  was gone for years until Jan 2020  Then "bad sinus infection" but really nose has been running for years and not an obvious trigger Sleeping: sleeping flat on one pillow ok on side  SABA use: started advair 3 weeks prior to OV  May help some  rec Stop advair and start dulera 100 Take 2 puffs first thing in am and then another 2 puffs about 12 hours later.  Work on inhaler technique:  Prednisone 10 mg take  4 each am x 2 days,   2 each am x 2 days,  1 each am x 2 days and stop  Pantoprazole (protonix) 40 mg   Take  30-60 min before first meal of the day and Pepcid (famotidine)  20 mg one @  bedtime until return to office - this is the best way to tell whether stomach acid is contributing to your problem.   GERD diet  Please remember to go to the  x-ray department  for your tests - we will call you with the results when they are available     07/01/2019  f/u ov/Angel Khan re: tracheomalacia =  same cough x years "nothing ever changes it"  Chief Complaint  Patient presents with  . Cough    Brings up very sticky congestion when coughing. Does not feel it has improved since last visit  Dyspnea: MMRC2 = can't walk a nl pace on a flat grade s sob but does fine slow and flat  Cough: no am flare/  Starts w/in 15 min - 30 min p stirring p symb 80 2bid/ using ppi maybe 50 % and not the flutter valve at all / min vol/ mucoid   Sleeping: on cpap / bed is 2 inches up at  head  SABA use: none  02: none  Lots of nasal congestion/ watery d/c on L  side > R  rec We schedule your for a sinus CT neg  Protonix 40 mg Take 30-60 min before first meal of the day automatically  For cough use mucinex dm 1200mg  every 12 hours and use the flutter valve as much as you can  Singulair 10 mg Take 30-60 min before first meal of the day  Stop the fish oil and eat more fish    08/03/2019  f/u ov/Angel Khan re: variable hx of cough x years, doe x 2019 / nasal symptoms better on singulair but not really helping the cough  Chief Complaint  Patient presents with  . Follow-up    Breathing is overall doing well. He still has prod cough with clear to white, sticky sputum.   Dyspnea:  Worse gradually x one year   - did chemical stress only in [facility] - does not appear to correlate with cough   Cough: about the same daytime x years  Sleeping: cpap and 2 in bed blocks no resp symptoms  SABA use: none  02: none  No better with mucinex / can't tell any benefit from prednisone  rec You should use the flutter valve as much as possible If you don't feel symbicort helping then ok to stop it      09/08/2019  f/u ov/Angel Khan re:  Sob / no better on symb/ no worse off / same for cough x years Chief Complaint  Patient presents with  . Follow-up    PFT's done today. Breathing is unchanged.   Dyspnea: able to walk Flat "anywhere he wants to walk" just problems with hills and if he gets in a real hurry = MMRC1 = can walk nl pace, flat grade, can't hurry or go uphills or steps s sob or sensation of chest tightness "like I did before my stent" yet cards w/u with cardiolyte neg 06/03/2019   - has treadmill, refuses to use  Cough: daytime / up to a tbsp most likely in am p d/cp cpap  Sleeping: cpap flat  SABA use: none  02: none    No obvious day to day or daytime variability or assoc excess/ purulent sputum or mucus plugs or hemoptysis or   subjective wheeze or overt sinus or hb symptoms.   Sleeping as above  without nocturnal  or early am exacerbation  of respiratory  c/o's or  need for noct saba. Also denies any obvious fluctuation of symptoms with weather or environmental changes or other aggravating or alleviating factors except as outlined above   No unusual exposure hx or h/o childhood pna/ asthma or knowledge of premature birth.  Current Allergies, Complete Past Medical History, Past Surgical History, Family History, and Social History were reviewed in [employer] record.  ROS  The following are not active complaints unless bolded Hoarseness, sore throat, dysphagia, dental problems, itching, sneezing,  nasal congestion or discharge of excess mucus or purulent secretions, ear ache,   fever, chills, sweats, unintended wt loss or wt gain, classically pleuritic or exertional cp,  orthopnea pnd or arm/hand swelling  or leg swelling, presyncope, palpitations, abdominal pain, anorexia, nausea, vomiting, diarrhea  or change in bowel habits or change in bladder habits, change in stools or change in urine, dysuria, hematuria,  rash, arthralgias, visual complaints, headache, numbness, weakness or ataxia or problems with walking or coordination,  change in mood or  memory.        Current Meds  Medication Sig  . aspirin 325 MG EC tablet Take 325 mg by mouth at bedtime.   . clopidogrel (PLAVIX) 75 MG tablet Take 75 mg by mouth at bedtime.   . dicyclomine (BENTYL) 20 MG tablet Take 20 mg by mouth 2 (two) times a day.  . ezetimibe (ZETIA) 10 MG tablet Take 10 mg by mouth every morning.   . famotidine (PEPCID) 20 MG tablet Take 20 mg by mouth daily.   . isosorbide mononitrate (IMDUR) 30 MG  24 hr tablet Take 30 mg by mouth at bedtime.   [name] losartan (COZAAR) 100 MG tablet Take 100 mg by mouth daily.  [name] MAGNESIUM PO Take 1 tablet by mouth daily.  . montelukast (SINGULAIR) 10 MG tablet Take 1 tablet (10 mg total) by mouth at bedtime.  . pantoprazole (PROTONIX) 40 MG tablet Take 1 tablet (40 mg total) by mouth 2 (two) times daily.  . Potassium 99 MG TABS Take 1  tablet by mouth daily.  [name] Respiratory Therapy Supplies (FLUTTER) DEVI by Does not apply route as directed.  . sertraline (ZOLOFT) 25 MG tablet Take 50 mg by mouth at bedtime.   . vitamin C (ASCORBIC ACID) 500 MG tablet Take 500 mg by mouth daily.                         Objective:   Physical Exam   amb obese somber wm nad   09/08/2019          190  08/03/2019          188   07/01/19 186 lb (84.4 kg)  05/19/19 187 lb 12.8 oz (85.2 kg)  03/16/19 187 lb (84.8 kg)  02/21/2015       186    Vital signs reviewed - Note on arrival 02 sats  97% on RA    . HEENT : pt wearing mask not removed for exam due to covid - 19 concerns.    NECK :  without JVD/Nodes/TM/ nl carotid upstrokes bilaterally   LUNGS: no acc muscle use,  Nl contour chest  mostly upper airway "wheeze" better with PLM without cough on insp or exp maneuvers   CV:  RRR  no s3 or murmur or increase in P2, and no edema   ABD:  Obese/  nontender with nl inspiratory excursion in the supine position. No bruits or organomegaly appreciated, bowel sounds nl  MS:  Nl gait/ ext warm without deformities, calf tenderness, cyanosis or clubbing No obvious joint restrictions   SKIN: warm and dry without lesions    NEURO:  alert, approp, nl sensorium with  no motor or cerebellar deficits apparent.     CXR PA and Lateral:   09/08/2019 :    I personally reviewed images and   impression as follows:   Minimal nonspecific increase in markings             Labs ordered/ reviewed:      Chemistry      Component Value Date/Time   NA 142 09/08/2019 1710   K 4.3 09/08/2019 1710   CL 104 09/08/2019 1710   CO2 30 09/08/2019 1710   BUN 23 09/08/2019 1710   CREATININE 1.17 09/08/2019 1710      Component Value Date/Time   CALCIUM 9.1 09/08/2019 1710        Lab Results  Component Value Date   WBC 9.7 09/08/2019   HGB 15.9 09/08/2019   HCT 48.2 09/08/2019   MCV 93.1 09/08/2019   PLT 266.0 09/08/2019       EOS                                                               0.4  09/08/2019   Lab Results  Component Value Date   DDIMER 0.41 09/08/2019      Lab Results  Component Value Date   TSH 1.02 09/08/2019     Lab Results  Component Value Date   PROBNP 102.0 (H) 09/08/2019                 Assessment & Plan:

## 2019-09-09 ENCOUNTER — Encounter: Payer: Self-pay | Admitting: Internal Medicine

## 2019-09-09 LAB — CBC WITH DIFFERENTIAL/PLATELET
Basophils Absolute: 0.1 10*3/uL (ref 0.0–0.1)
Basophils Relative: 0.8 % (ref 0.0–3.0)
Eosinophils Absolute: 0.4 10*3/uL (ref 0.0–0.7)
Eosinophils Relative: 4.5 % (ref 0.0–5.0)
HCT: 48.2 % (ref 39.0–52.0)
Hemoglobin: 15.9 g/dL (ref 13.0–17.0)
Lymphocytes Relative: 39 % (ref 12.0–46.0)
Lymphs Abs: 3.8 10*3/uL (ref 0.7–4.0)
MCHC: 33 g/dL (ref 30.0–36.0)
MCV: 93.1 fl (ref 78.0–100.0)
Monocytes Absolute: 1.1 10*3/uL — ABNORMAL HIGH (ref 0.1–1.0)
Monocytes Relative: 11.7 % (ref 3.0–12.0)
Neutro Abs: 4.3 10*3/uL (ref 1.4–7.7)
Neutrophils Relative %: 44 % (ref 43.0–77.0)
Platelets: 266 10*3/uL (ref 150.0–400.0)
RBC: 5.17 Mil/uL (ref 4.22–5.81)
RDW: 15 % (ref 11.5–15.5)
WBC: 9.7 10*3/uL (ref 4.0–10.5)

## 2019-09-09 LAB — BASIC METABOLIC PANEL
BUN: 23 mg/dL (ref 6–23)
CO2: 30 mEq/L (ref 19–32)
Calcium: 9.1 mg/dL (ref 8.4–10.5)
Chloride: 104 mEq/L (ref 96–112)
Creatinine, Ser: 1.17 mg/dL (ref 0.40–1.50)
GFR: 60.48 mL/min (ref >60.00)
Glucose, Bld: 110 mg/dL — ABNORMAL HIGH (ref 70–99)
Potassium: 4.3 mEq/L (ref 3.5–5.1)
Sodium: 142 mEq/L (ref 135–145)

## 2019-09-09 LAB — TSH: TSH: 1.02 u[IU]/mL (ref 0.35–4.50)

## 2019-09-09 LAB — BRAIN NATRIURETIC PEPTIDE: Pro B Natriuretic peptide (BNP): 102 pg/mL — ABNORMAL HIGH (ref 0.0–100.0)

## 2019-09-09 NOTE — Assessment & Plan Note (Addendum)
Onset 2019 with neg cards w/u [facility] neg cardiolyte 06/03/19  - 08/03/2019   Walked RA  2 laps @  approx 219ft each @ fast pace  stopped due to  End of study, min sob, sats 97%   - PFT's  09/08/2019  FEV1 2.00 (82 % ) ratio 0.81  p 3 % improvement from saba p nothing prior to study with DLCO  19.99 (93%) corrects to 4.67 (115 %)  for alv volume and FV curve nl   ERV 28%    I had an extended final summary discussion with the patient reviewing all relevant studies completed to date and  lasting 25 minutes of a 40 minute visit on the following issues:   1) his labs/cxr ar nl and pfts off bronchodilators are nl x for low erv despite ongoing daily symptoms of doe which we could not reproduce here " because he was walking flat"  But also not being asked to bend over and lift up objects (the latter explained by low erv meaning abd effects on lung volumes from obesity.   2) the assoc chest discomfort "just like before my stent" is a chronic complaint already eval by cards but interestingly only studied with cardiolyte which does not prove his sob is not related to overall cardiac funciton though the cp is unlikely to represent angina at this point.   3) best approach is regular sub max ex x 30 min daily then proceed with cpst after a month if not making progress with doe and neg cal balance/wt loss.  4) he does not understand the concept of sub max exercise and showed no interest at all in hearing how he could achieve this so my expectations are low that he will comply with these recs  5) the ball is in his court.

## 2019-09-09 NOTE — Assessment & Plan Note (Signed)
Flared Jan 2020 with neg resp to rx by [facility]  - trial off acei 12/21/13 > resolved on benicar - recurred 12/2014 on cozar > resumed benicar  02/23/2015  -med calendar 03/22/2015 > did not bring to pulmonary clinic 05/04/15 as requested  - PFT's 05/04/15 wnl  -CT sinus 05/04/2015  1. No CT evidence of acute or chronic sinusitis. 2. 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 - allergy profile 05/05/2015  >  Eos 0.3/   IgE 610  Grass/ trees/dogs/cats  05/19/2019 d/c advair and trial of duler 100/ max gerd rx >>>  - Sinus CT 07/13/2019  No significant sinus disease.  Refuses to use nasal steroids, no better on otcs or singulair or with flutter valve  No response to symbicort/pred so unlikely cough variant asthma.   >>>> Next step is allergy eval if he'll agree to go as likely this is pnds ? 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

## 2019-09-09 NOTE — Assessment & Plan Note (Signed)
See FOB 10/28/17 Javaid   spirometry nl 10/2017 and 03/08/19 and  09/08/2019   >>> no evidence at all for either large or small airway obst on today's eval so this dx seems unlikely.  He did demonstrate upper airway "wheeze" on exam that resolved with plm I encouraged him to use while walking but I believe this is functional (vcd like) in origin and not mechanical as reflected by a nearly nl  FV loop on both insp and exp.

## 2019-09-09 NOTE — Progress Notes (Signed)
Spoke with pt and notified of results per Dr. Wert. Pt verbalized understanding and denied any questions. 

## 2019-10-15 ENCOUNTER — Encounter: Payer: Self-pay | Admitting: Internal Medicine

## 2019-11-01 ENCOUNTER — Telehealth: Payer: Self-pay | Admitting: Internal Medicine

## 2019-11-01 MED ORDER — DICYCLOMINE HCL 20 MG PO TABS: 20.0000 mg | ORAL_TABLET | Freq: Two times a day (BID) | ORAL | 1 refills | Status: DC

## 2019-11-01 NOTE — Telephone Encounter (Signed)
Dicyclomine refilled and I left him a detailed message that he can continue it until his appointment.

## 2019-11-01 NOTE — Telephone Encounter (Signed)
OK to refill if he wants it

## 2019-11-01 NOTE — Telephone Encounter (Signed)
Pt. is calling to get Dicyclomine refilled. He has a appt on 12/15 with Dr. Carlean Purl per the letter he was sent asking him to make an office visit. Pt is wondering if he need to continue to take this medication or hold off until he seems Dr. Carlean Purl.

## 2019-11-01 NOTE — Telephone Encounter (Signed)
His colon recall was changed to an office visit. Please advise and I will call him Sir, thank you.

## 2019-12-02 ENCOUNTER — Telehealth: Payer: Self-pay | Admitting: Internal Medicine

## 2019-12-02 NOTE — Telephone Encounter (Signed)
aware

## 2019-12-02 NOTE — Telephone Encounter (Signed)
FYI- pt having heart cath done 12/09/19 with Dr Prince Rome

## 2019-12-14 ENCOUNTER — Ambulatory Visit (INDEPENDENT_AMBULATORY_CARE_PROVIDER_SITE_OTHER): Payer: Medicare Other | Admitting: Internal Medicine

## 2019-12-14 ENCOUNTER — Other Ambulatory Visit: Payer: Self-pay

## 2019-12-14 ENCOUNTER — Encounter: Payer: Self-pay | Admitting: Internal Medicine

## 2019-12-14 VITALS — BP 112/64 | HR 77 | Temp 98.6°F | Ht 65.0 in | Wt 191.8 lb

## 2019-12-14 DIAGNOSIS — K2270 Barrett's esophagus without dysplasia: Secondary | ICD-10-CM

## 2019-12-14 DIAGNOSIS — K588 Other irritable bowel syndrome: Secondary | ICD-10-CM | POA: Diagnosis not present

## 2019-12-14 DIAGNOSIS — Z86010 Personal history of colonic polyps: Secondary | ICD-10-CM

## 2019-12-14 MED ORDER — DICYCLOMINE HCL 20 MG PO TABS: 20.0000 mg | ORAL_TABLET | Freq: Two times a day (BID) | ORAL | 1 refills | Status: DC

## 2019-12-14 NOTE — Progress Notes (Signed)
Angel Khan 78 y.o. Mar 23, 1941 [id number]  Assessment & Plan:   Encounter Diagnoses  Name Primary?  Angel Khan Hx of adenomatous colonic polyps Yes  . Other irritable bowel syndrome   . Barrett's esophagus without dysplasia    Given overall situation w/ age, polyp hx, tracheomalcia issues would hold colonoscopy for signs and sxs and not do routine surveillance going forward  [id number], [name], PA-C   Subjective:   Chief Complaint: hx colon polyps  HPI 78 yo wm w/ hx adenomatous colon polyps, GERD + Barrett's Had colonoscopy 2014 diminutive ssp and adenoma So technically due for recall but now 46 'dicyclomine still helps IBS sxs, BM's ok  No GERD sxs in PPI \ DOE lately  Cardiac cath "2 blockages" Tracheomalacia dx     Allergies  Allergen Reactions  . Depakote [Divalproex Sodium]     Hair loss   . Other Other (See Comments)    Hair loss, rash Liquid Magnesium  . Propranolol Other (See Comments)    Muscle cramps  . Protonix [Pantoprazole]   . Statins Other (See Comments)    Muscle cramps   Current Meds  Medication Sig  . aspirin 325 MG EC tablet Take 325 mg by mouth at bedtime.   . clopidogrel (PLAVIX) 75 MG tablet Take 75 mg by mouth at bedtime.   . dicyclomine (BENTYL) 20 MG tablet Take 1 tablet (20 mg total) by mouth 2 (two) times daily.  Angel Khan ezetimibe (ZETIA) 10 MG tablet Take 10 mg by mouth every morning.   . famotidine (PEPCID) 20 MG tablet Take 20 mg by mouth daily.   . isosorbide mononitrate (IMDUR) 30 MG 24 hr tablet Take 30 mg by mouth at bedtime.   Angel Khan losartan (COZAAR) 100 MG tablet Take 100 mg by mouth daily.  Angel Khan MAGNESIUM PO Take 1 tablet by mouth daily.  . methocarbamol (ROBAXIN) 500 MG tablet Take 500 mg by mouth 4 (four) times daily.  . montelukast (SINGULAIR) 10 MG tablet Take 1 tablet (10 mg total) by mouth at bedtime.  . pantoprazole (PROTONIX) 40 MG tablet Take 1 tablet (40 mg total) by mouth 2 (two) times daily. (Patient taking  differently: Take 40 mg by mouth daily. )  . Potassium 99 MG TABS Take 1 tablet by mouth daily.  Angel Khan Respiratory Therapy Supplies (FLUTTER) DEVI by Does not apply route as directed.  . sertraline (ZOLOFT) 25 MG tablet Take 50 mg by mouth at bedtime.   . traMADol (ULTRAM) 50 MG tablet Take by mouth every 6 (six) hours as needed.  . vitamin C (ASCORBIC ACID) 500 MG tablet Take 500 mg by mouth daily.   Past Medical History:  Diagnosis Date  . Adenomatous colon polyp   . Barrett esophagus   . CAD (coronary artery disease)   . Chronic headaches   . COPD (chronic obstructive pulmonary disease) ([place])   . Coronary atherosclerosis   . Diverticulitis of colon December 2012   Diagnosed by CT scan [facility]  . DM (diabetes mellitus) ([place])   . GERD (gastroesophageal reflux disease)   . Hemorrhoids   . History of kidney stones   . HLD (hyperlipidemia)   . Horseshoe kidney   . HTN (hypertension)   . Melanoma ([place])   . Poor short term memory   . Tracheobronchomalacia    Past Surgical History:  Procedure Laterality Date  . CHOLECYSTECTOMY  01/12/2013   Dr. Gerlene Fee  . COLONOSCOPY W/ BIOPSIES AND POLYPECTOMY  09/08/2007  adenomatous polyps, diverticulosis, external hemorrhoids  . CORONARY ARTERY BYPASS GRAFT    . CORONARY STENT PLACEMENT     x 5  . TONSILLECTOMY    . UMBILICAL HERNIA REPAIR    . UPPER GASTROINTESTINAL ENDOSCOPY  10/12/2010   barrett's, fondic gland polyps, duodenitis   Social History   Social History Narrative   Married has 3 children he is retired   Former smoker, no alcohol tobacco or drug use now   family history includes Colitis in an other family member; Diabetes in his mother; Heart disease in his brother, father, and mother; Rheumatic fever in his sister.   Review of Systems As ab ove  Objective:   Physical Exam BP 112/64   Pulse 77   Temp 98.6 F (37 C)   Ht 5\' 5"  (1.651 m)   Wt 191 lb 12.8 oz (87 kg)   SpO2 98%   BMI 31.92  kg/m  NAD

## 2019-12-14 NOTE — Patient Instructions (Signed)
I refilled the dicyclomine. Let me know the new mail order place and we will send it when you know.   We will not do routine colonoscopy going forward - if you have bleeding, low blood counts or changes in bowel habits let me know.   I appreciate the opportunity to care for you. Gatha Mayer, MD, [facility]

## 2020-02-14 ENCOUNTER — Ambulatory Visit: Payer: Medicare Other | Admitting: Internal Medicine

## 2020-02-14 ENCOUNTER — Other Ambulatory Visit: Payer: Self-pay

## 2020-02-14 ENCOUNTER — Encounter: Payer: Self-pay | Admitting: Adult Health

## 2020-02-14 ENCOUNTER — Ambulatory Visit (INDEPENDENT_AMBULATORY_CARE_PROVIDER_SITE_OTHER): Payer: Medicare Other | Admitting: Adult Health

## 2020-02-14 VITALS — BP 128/70 | HR 86 | Temp 97.3°F | Ht 65.0 in | Wt 190.2 lb

## 2020-02-14 DIAGNOSIS — K2270 Barrett's esophagus without dysplasia: Secondary | ICD-10-CM | POA: Diagnosis not present

## 2020-02-14 DIAGNOSIS — R058 Other specified cough: Secondary | ICD-10-CM

## 2020-02-14 DIAGNOSIS — J45991 Cough variant asthma: Secondary | ICD-10-CM | POA: Diagnosis not present

## 2020-02-14 DIAGNOSIS — R05 Cough: Secondary | ICD-10-CM

## 2020-02-14 DIAGNOSIS — J398 Other specified diseases of upper respiratory tract: Secondary | ICD-10-CM

## 2020-02-14 MED ORDER — BENZONATATE 200 MG PO CAPS: 200.0000 mg | ORAL_CAPSULE | Freq: Three times a day (TID) | ORAL | 1 refills | Status: DC | PRN

## 2020-02-14 MED ORDER — CHLORPHENIRAMINE MALEATE 4 MG PO TABS: 4.0000 mg | ORAL_TABLET | Freq: Every day | ORAL | 5 refills | Status: DC

## 2020-02-14 MED ORDER — DEXTROMETHORPHAN POLISTIREX ER 30 MG/5ML PO SUER: 15.0000 mg | Freq: Two times a day (BID) | ORAL | 5 refills | Status: DC | PRN

## 2020-02-14 NOTE — Assessment & Plan Note (Signed)
Placed on cough suppression regimen Refer to allergy for elevated IgE and a center feels Continue on CPAP at bedtime.

## 2020-02-14 NOTE — Assessment & Plan Note (Signed)
Continue on GERD treatment 

## 2020-02-14 NOTE — Patient Instructions (Signed)
Labs today.  Delsym 2 tsp Twice daily  For cough , As needed   Begin Tessalon Three times a day  For cough As needed   Begin Chlorpheniramine (Chlor tabs) 4mg  At bedtime  .  Sips of water to soothe throat to prevent cough or throat clearing.  Wear CPAP At bedtime Refer to Allergist .  Change Down mattress pad.  Change to allergy pillow , filters etc.  Follow up with Dr. Melvyn Novas  Or Misti Towle NP in 4-6 WEEKS  Please contact office for sooner follow up if symptoms do not improve or worsen or seek emergency care

## 2020-02-14 NOTE — Progress Notes (Signed)
@Patient  ID: Angel Khan, male    DOB: 1941-12-11, 79 y.o.   MRN: [id number]  Chief Complaint  Patient presents with  . Follow-up    Cough     Referring provider: Edmonia James, PA-C  HPI: 79 year old male former smoker followed for chronic cough, tracheobronchomalcia , chronic sinusitis Previous evaluation at [facility] chest   TEST/EVENTS :  spirometry from 10/2017 demonstrates FEV1 1.84 /75% predicted.  PFT's  09/08/2019  FEV1 2.00 (82 % ) ratio 0.81  p 3 % improvement from saba p nothing prior to study with DLCO  19.99 (93%) corrects to 4.67 (115 %)  for alv volume and FV curve nl   ERV 28%   CT sinus 05/04/2015  1. No CT evidence of acute or chronic sinusitis. 2. 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 - allergy profile 05/05/2015  >  Eos 0.3/   IgE 610  Grass/ trees/dogs/cats   02/14/2020 Follow up : Cough  Patient presents for a 65-month follow-up.  Patient says he is very frustrated continues to have a daily cough that is minimally productive with very sticky mucus.  Cough is severe at times with coughing paroxysms keeps him awake and he has very aggravated that this will not go away.  It has been present for the last 3 years.  He has had multiple evaluations.  He has been seen at [facility] chest diagnosed with tracheobronchomalcia .  Previously on Azithromycin 3 days a week, with no perceived benefit.  Has used Flutter valve with no perceived benefit.  Tried Advair, Dulera and Symbicort . No benefit with steroids.  He remains on  PPI and pepcid .  Previous work-up showed elevated IgE at 610 and elevated eosinophils absolute count at 400.  Patient has watery eyes nasal drainage and very sticky mucus. Does have obstructive sleep apnea is on nocturnal CPAP. Previous CT chest December 2019 showed clear lungs with no acute or suspicious findings. Pulmonary function testing showed no airflow obstruction or restriction.  No significant bronchodilator response.  And  DLCO was normal.  Patient denies any pets, hot tub, basement.  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 Patient does have a down mattress pad.   Allergies  Allergen Reactions  . Depakote [Divalproex Sodium]     Hair loss   . Other Other (See Comments)    Hair loss, rash Liquid Magnesium  . Propranolol Other (See Comments)    Muscle cramps  . Statins Other (See Comments)    Muscle cramps    Immunization History  Administered Date(s) Administered  . Influenza Split 09/29/2013, 01/21/2017  . Influenza, High Dose Seasonal PF 09/13/2011, 11/24/2017, 11/06/2018  . Influenza, Seasonal, Injecte, Preservative Fre 01/25/2015, 02/05/2016  . Influenza-Unspecified 10/05/2012, 10/11/2013, 01/13/2015  . Pneumococcal Conjugate-13 01/21/2018  . Pneumococcal Polysaccharide-23 01/04/2013  . Pneumococcal-Unspecified 12/31/2011  . Tdap 06/25/2004, 01/21/2018    Past Medical History:  Diagnosis Date  . Adenomatous colon polyp   . Barrett esophagus   . CAD (coronary artery disease)   . Chronic headaches   . COPD (chronic obstructive pulmonary disease) ([place])   . Coronary atherosclerosis   . Diverticulitis of colon December 2012   Diagnosed by CT scan [facility]  . DM (diabetes mellitus) ([place])   . GERD (gastroesophageal reflux disease)   . Hemorrhoids   . History of kidney stones   . HLD (hyperlipidemia)   . Horseshoe kidney   . HTN (hypertension)   .  Melanoma ([place])   . Poor short term memory   . Tracheobronchomalacia     Tobacco History: Social History   Tobacco Use  Smoking Status Former Smoker  . Packs/day: 1.00  . Years: 40.00  . Pack years: 40.00  . Types: Cigarettes  . Quit date: 12/31/1975  . Years since quitting: 44.1  Smokeless Tobacco Never Used   Counseling given: Not Answered   Outpatient Medications Prior to Visit  Medication Sig Dispense Refill  . aspirin 325 MG EC tablet Take 325 mg by mouth at bedtime.       . clopidogrel (PLAVIX) 75 MG tablet Take 75 mg by mouth at bedtime.     . dicyclomine (BENTYL) 20 MG tablet Take 1 tablet (20 mg total) by mouth 2 (two) times daily. 60 tablet 1  . ezetimibe (ZETIA) 10 MG tablet Take 10 mg by mouth every morning.     . famotidine (PEPCID) 20 MG tablet Take 20 mg by mouth daily.     . isosorbide mononitrate (IMDUR) 30 MG 24 hr tablet Take 30 mg by mouth at bedtime.     [name] losartan (COZAAR) 100 MG tablet Take 100 mg by mouth daily.    [name] MAGNESIUM PO Take 1 tablet by mouth daily.    . methocarbamol (ROBAXIN) 500 MG tablet Take 500 mg by mouth 4 (four) times daily.    . montelukast (SINGULAIR) 10 MG tablet Take 1 tablet (10 mg total) by mouth at bedtime. 30 tablet 11  . pantoprazole (PROTONIX) 40 MG tablet Take 1 tablet (40 mg total) by mouth daily. 90 tablet   . Potassium 99 MG TABS Take 1 tablet by mouth daily.    [name] Respiratory Therapy Supplies (FLUTTER) DEVI by Does not apply route as directed.    . sertraline (ZOLOFT) 25 MG tablet Take 50 mg by mouth at bedtime.     . traMADol (ULTRAM) 50 MG tablet Take by mouth every 6 (six) hours as needed.    . vitamin C (ASCORBIC ACID) 500 MG tablet Take 500 mg by mouth daily.     No facility-administered medications prior to visit.     Review of Systems:   Constitutional:   No  weight loss, night sweats,  Fevers, chills,  +fatigue, or  lassitude.  HEENT:   No headaches,  Difficulty swallowing,  Tooth/dental problems, or  Sore throat,                No sneezing, itching, ear ache,  +nasal congestion, post nasal drip,   CV:  No chest pain,  Orthopnea, PND, swelling in lower extremities, anasarca, dizziness, palpitations, syncope.   GI  No heartburn, indigestion, abdominal pain, nausea, vomiting, diarrhea, change in bowel habits, loss of appetite, bloody stools.   Resp:    No chest wall deformity  Skin: no rash or lesions.  GU: no dysuria, change in color of urine, no urgency or frequency.  No flank pain, no  hematuria   MS:  No joint pain or swelling.  No decreased range of motion.  No back pain.    Physical Exam  BP 128/70 (BP Location: Left Arm, Cuff Size: Normal)   Pulse 86   Temp (!) 97.3 F (36.3 C) (Temporal)   Ht 5\' 5"  (1.651 m)   Wt 190 lb 3.2 oz (86.3 kg)   SpO2 98% Comment: RA  BMI 31.65 kg/m   GEN: A/Ox3; pleasant , NAD    HEENT:  [place]/AT,   , NOSE-clear drainage  THROAT-clear, no lesions, no postnasal drip or exudate noted.   NECK:  Supple w/ fair ROM; no JVD; normal carotid impulses w/o bruits; no thyromegaly or nodules palpated; no lymphadenopathy.    RESP  Clear  P & A; w/o, wheezes/ rales/ or rhonchi. no accessory muscle use, no dullness to percussion  CARD:  RRR, no m/r/g, tr  peripheral edema, pulses intact, no cyanosis or clubbing.  GI:   Soft & nt; nml bowel sounds; no organomegaly or masses detected.   Musco: Warm bil, no deformities or joint swelling noted.   Neuro: alert, no focal deficits noted.    Skin: Warm, no lesions or rashes    Lab Results:  CBC    Component Value Date/Time   WBC 9.7 09/08/2019 1710   RBC 5.17 09/08/2019 1710   HGB 15.9 09/08/2019 1710   HCT 48.2 09/08/2019 1710   PLT 266.0 09/08/2019 1710   MCV 93.1 09/08/2019 1710   MCHC 33.0 09/08/2019 1710   RDW 15.0 09/08/2019 1710   LYMPHSABS 3.8 09/08/2019 1710   MONOABS 1.1 (H) 09/08/2019 1710   EOSABS 0.4 09/08/2019 1710   BASOSABS 0.1 09/08/2019 1710    BMET    Component Value Date/Time   NA 142 09/08/2019 1710   K 4.3 09/08/2019 1710   CL 104 09/08/2019 1710   CO2 30 09/08/2019 1710   GLUCOSE 110 (H) 09/08/2019 1710   BUN 23 09/08/2019 1710   CREATININE 1.17 09/08/2019 1710   CALCIUM 9.1 09/08/2019 1710    BNP No results found for: BNP  ProBNP    Component Value Date/Time   PROBNP 102.0 (H) 09/08/2019 1710    Imaging: No results found.    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  Patient may have an allergic component with elevated IgE and eosinophils-we will refer to allergy Patient has had an extensive work-up that has been unrevealing except for the allergic component.  CT chest was negative.  PFTs were normal.  CT sinus showed no evidence of extensive sinus disease. Patient has been treated with GERD treatment.  Has had no perceived benefit with Advair, Dulera, Symbicort, azithromycin 3 days weekly or steroid treatments. Have advised patient on cough suppression regimen.  Patient is on CPAP which have encouraged him to use on a consistent basis at bedtime as this may help as well. Asked him to change his down mattress pad. We will check a hypersensitivity panel Placed on cough suppression regimen with Delsym and Tessalon around-the-clock and add in Chlor-Trimeton at bedtime   Plan  Patient Instructions  Labs today.  Delsym 2 tsp Twice daily  For cough , As needed   Begin Tessalon Three times a day  For cough As needed  Begin Chlorpheniramine (Chlor tabs) 4mg  At bedtime  .  Sips of water to soothe throat to prevent cough or throat clearing.  Wear CPAP At bedtime Refer to Allergist .  Change Down mattress pad.  Change to allergy pillow , filters etc.  Follow up with Dr. Melvyn Novas  Or Herve Haug NP in 4-6 WEEKS  Please contact office for sooner follow up if symptoms do not improve or worsen or seek emergency care        Tracheomalacia Placed on cough suppression  regimen Refer to allergy for elevated IgE and a center feels Continue on CPAP at bedtime.    BARRETTS ESOPHAGUS Continue on GERD treatment     Lanell Dubie, NP 02/14/2020

## 2020-02-14 NOTE — Assessment & Plan Note (Signed)
Chronic cough with associated trachealbronchomalcia .  Patient may have an allergic component with elevated IgE and eosinophils-we will refer to allergy Patient has had an extensive work-up that has been unrevealing except for the allergic component.  CT chest was negative.  PFTs were normal.  CT sinus showed no evidence of extensive sinus disease. Patient has been treated with GERD treatment.  Has had no perceived benefit with Advair, Dulera, Symbicort, azithromycin 3 days weekly or steroid treatments. Have advised patient on cough suppression regimen.  Patient is on CPAP which have encouraged him to use on a consistent basis at bedtime as this may help as well. Asked him to change his down mattress pad. We will check a hypersensitivity panel Placed on cough suppression regimen with Delsym and Tessalon around-the-clock and add in Chlor-Trimeton at bedtime   Plan  Patient Instructions  Labs today.  Delsym 2 tsp Twice daily  For cough , As needed   Begin Tessalon Three times a day  For cough As needed   Begin Chlorpheniramine (Chlor tabs) 4mg  At bedtime  .  Sips of water to soothe throat to prevent cough or throat clearing.  Wear CPAP At bedtime Refer to Allergist .  Change Down mattress pad.  Change to allergy pillow , filters etc.  Follow up with Dr. Melvyn Novas  Or Ryland Tungate NP in 4-6 WEEKS  Please contact office for sooner follow up if symptoms do not improve or worsen or seek emergency care

## 2020-02-18 LAB — HYPERSENSITIVITY PNEUMONITIS
A. Pullulans Abs: NEGATIVE
A.Fumigatus #1 Abs: NEGATIVE
Micropolyspora faeni, IgG: NEGATIVE
Pigeon Serum Abs: NEGATIVE
Thermoact. Saccharii: NEGATIVE
Thermoactinomyces vulgaris, IgG: NEGATIVE

## 2020-03-22 ENCOUNTER — Telehealth: Payer: Self-pay | Admitting: Adult Health

## 2020-03-22 NOTE — Telephone Encounter (Signed)
Called patient for more detail on what medication he needs sent to pharmacy. LMTCB

## 2020-03-23 MED ORDER — MUCINEX 600 MG PO TB12: 600.0000 mg | ORAL_TABLET | Freq: Two times a day (BID) | ORAL | 5 refills | Status: DC | PRN

## 2020-03-23 NOTE — Telephone Encounter (Signed)
That is fine 

## 2020-03-23 NOTE — Telephone Encounter (Signed)
Rx was sent to pharm for mucinex and I spoke with the pt and notified him that this was done.

## 2020-03-23 NOTE — Telephone Encounter (Signed)
Called and spoke with patient and he states he was given a RX for mucinex at his last but has misplaced it and would like a prescription sent to [facility] in [place].  Tammy please advise

## 2020-03-27 ENCOUNTER — Ambulatory Visit: Payer: Medicare Other | Admitting: Adult Health

## 2020-04-04 ENCOUNTER — Ambulatory Visit (INDEPENDENT_AMBULATORY_CARE_PROVIDER_SITE_OTHER): Payer: Medicare Other | Admitting: Adult Health

## 2020-04-04 ENCOUNTER — Other Ambulatory Visit: Payer: Self-pay

## 2020-04-04 ENCOUNTER — Encounter: Payer: Self-pay | Admitting: Adult Health

## 2020-04-04 DIAGNOSIS — J302 Other seasonal allergic rhinitis: Secondary | ICD-10-CM | POA: Insufficient documentation

## 2020-04-04 DIAGNOSIS — J45991 Cough variant asthma: Secondary | ICD-10-CM | POA: Diagnosis not present

## 2020-04-04 DIAGNOSIS — G4733 Obstructive sleep apnea (adult) (pediatric): Secondary | ICD-10-CM

## 2020-04-04 DIAGNOSIS — R768 Other specified abnormal immunological findings in serum: Secondary | ICD-10-CM

## 2020-04-04 DIAGNOSIS — J3089 Other allergic rhinitis: Secondary | ICD-10-CM | POA: Insufficient documentation

## 2020-04-04 DIAGNOSIS — J309 Allergic rhinitis, unspecified: Secondary | ICD-10-CM

## 2020-04-04 NOTE — Progress Notes (Signed)
@Patient  ID: Angel Khan, male    DOB: 1941-10-24, 79 y.o.   MRN: [id number]  Chief Complaint  Patient presents with  . Follow-up    Cough     Referring provider: Edmonia James, PA-C  HPI: 79 yo male former smoker followed for chronic cough, tracheobronchomalacia , chronic sinusitis. Dx with OSA in past on nocturnal CPAP .  Previous evaluation at [facility]   TEST/EVENTS :  spirometry from 10/2017 demonstrates FEV1 1.84 /75% predicted.  PFT's9/9/2020FEV1 2.00(82% ) ratio 0.81p 3% improvement from saba p nothingprior to study with DLCO 19.99(93%) corrects to4.67(115%) for alv volume and FV curve nlERV 28%  CT sinus 05/04/2015  1. No CT evidence of acute or chronic sinusitis. 2. 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 - allergy profile 05/05/2015 >Eos 0.3/ IgE 610 Grass/ trees/dogs/cats   HSP panel neg. 02/2020  04/04/2020 Follow up : Chronic cough , OSA , CR  Returns for a follow-up.  Patient has a chronic cough that has been going on for over 3 years.  He has had multiple evaluations previously seen at [facility] chest diagnosed with tracheobronchomalacia .  Previously treated with azithromycin 3 days a week with no perceived benefit.  Also started on flutter valve with no perceived benefit.  Has tried Advair Dulera and Symbicort and steroids with no perceived benefit.  He has been treated for chronic rhinitis and possible GERD as cough triggers.  Previous work-up showed elevated IgE at 610 and elevated eosinophils absolute count of 400.  He does have some nasal drainage watery eyes and very sticky mucus intermittently. Last visit patient was recommended begin Delsym and Tessalon.  Patient says he did not start this because he is worried he will not be able to cough up his mucus if he uses that.  He did use Mucinex.  He does feel that since last visit his cough is somewhat better.  He is able to cough and get up the mucus which makes him  feel better.  He denies any fever chest pain orthopnea PND or increased leg swelling. #2 Covid vaccine tomorrow.  He was referred to allergy last visit however this visit has not been set up.   Patient has underlying sleep apnea is on nocturnal CPAP.  He says he is not been able to wear this recently because he is having a lot of neck issues.  He has chronic joint pain.  We discussed the importance of using his CPAP.  Potential complications of untreated sleep apnea.  He also is on chronic pain medicines.  Advised of the dangers of using sedating medications along with untreated sleep apnea.     Allergies  Allergen Reactions  . Depakote [Divalproex Sodium]     Hair loss   . Other Other (See Comments)    Hair loss, rash Liquid Magnesium  . Propranolol Other (See Comments)    Muscle cramps  . Statins Other (See Comments)    Muscle cramps    Immunization History  Administered Date(s) Administered  . Influenza Split 09/29/2013, 01/21/2017  . Influenza, High Dose Seasonal PF 09/13/2011, 11/24/2017, 11/06/2018  . Influenza, Seasonal, Injecte, Preservative Fre 01/25/2015, 02/05/2016  . Influenza-Unspecified 10/05/2012, 10/11/2013, 01/13/2015  . Moderna SARS-COVID-2 Vaccination 03/15/2020  . Pneumococcal Conjugate-13 01/21/2018  . Pneumococcal Polysaccharide-23 01/04/2013  . Pneumococcal-Unspecified 12/31/2011  . Tdap 06/25/2004, 01/21/2018    Past Medical History:  Diagnosis Date  . Adenomatous colon polyp   . Barrett esophagus   . CAD (coronary artery  disease)   . Chronic headaches   . COPD (chronic obstructive pulmonary disease) ([place])   . Coronary atherosclerosis   . Diverticulitis of colon December 2012   Diagnosed by CT scan [facility]  . DM (diabetes mellitus) ([place])   . GERD (gastroesophageal reflux disease)   . Hemorrhoids   . History of kidney stones   . HLD (hyperlipidemia)   . Horseshoe kidney   . HTN (hypertension)   . Melanoma ([place])   . Poor  short term memory   . Tracheobronchomalacia     Tobacco History: Social History   Tobacco Use  Smoking Status Former Smoker  . Packs/day: 1.00  . Years: 40.00  . Pack years: 40.00  . Types: Cigarettes  . Quit date: 12/31/1975  . Years since quitting: 44.2  Smokeless Tobacco Never Used   Counseling given: Not Answered   Outpatient Medications Prior to Visit  Medication Sig Dispense Refill  . aspirin 325 MG EC tablet Take 325 mg by mouth at bedtime.     . benzonatate (TESSALON) 200 MG capsule Take 1 capsule (200 mg total) by mouth 3 (three) times daily as needed for cough. 90 capsule 1  . chlorpheniramine (CHLOR-TRIMETON) 4 MG tablet Take 1 tablet (4 mg total) by mouth at bedtime. 30 tablet 5  . clopidogrel (PLAVIX) 75 MG tablet Take 75 mg by mouth at bedtime.     . dicyclomine (BENTYL) 20 MG tablet Take 1 tablet (20 mg total) by mouth 2 (two) times daily. 60 tablet 1  . ezetimibe (ZETIA) 10 MG tablet Take 10 mg by mouth every morning.     . famotidine (PEPCID) 20 MG tablet Take 20 mg by mouth daily.     [name] guaiFENesin (MUCINEX) 600 MG 12 hr tablet Take 1 tablet (600 mg total) by mouth 2 (two) times daily as needed. 60 tablet 5  . HYDROcodone-acetaminophen (NORCO) 7.5-325 MG tablet Take 1 tablet by mouth 2 (two) times daily as needed.    . isosorbide mononitrate (IMDUR) 30 MG 24 hr tablet Take 30 mg by mouth at bedtime.     [name] losartan (COZAAR) 100 MG tablet Take 100 mg by mouth daily.    [name] MAGNESIUM PO Take 1 tablet by mouth daily.    . montelukast (SINGULAIR) 10 MG tablet Take 1 tablet (10 mg total) by mouth at bedtime. 30 tablet 11  . pantoprazole (PROTONIX) 40 MG tablet Take 1 tablet (40 mg total) by mouth daily. 90 tablet   . Potassium 99 MG TABS Take 1 tablet by mouth daily.    [name] Respiratory Therapy Supplies (FLUTTER) DEVI by Does not apply route as directed.    . sertraline (ZOLOFT) 25 MG tablet Take 50 mg by mouth at bedtime.     . vitamin C (ASCORBIC ACID) 500 MG tablet Take  500 mg by mouth daily.    . methocarbamol (ROBAXIN) 500 MG tablet Take 500 mg by mouth 4 (four) times daily.    . traMADol (ULTRAM) 50 MG tablet Take by mouth every 6 (six) hours as needed.    [name] dextromethorphan (DELSYM) 30 MG/5ML liquid Take 2.5 mLs (15 mg total) by mouth 2 (two) times daily as needed for cough. (Patient not taking: Reported on 04/04/2020) 89 mL 5   No facility-administered medications prior to visit.     Review of Systems:   Constitutional:   No  weight loss, night sweats,  Fevers, chills, + fatigue, or  lassitude.  HEENT:   No  headaches,  Difficulty swallowing,  Tooth/dental problems, or  Sore throat,                No sneezing, itching, ear ache,  +nasal congestion, post nasal drip,   CV:  No chest pain,  Orthopnea, PND, swelling in lower extremities, anasarca, dizziness, palpitations, syncope.   GI  No heartburn, indigestion, abdominal pain, nausea, vomiting, diarrhea, change in bowel habits, loss of appetite, bloody stools.   Resp:  .  No chest wall deformity  Skin: no rash or lesions.  GU: no dysuria, change in color of urine, no urgency or frequency.  No flank pain, no hematuria   MS:  Chronic pain     Physical Exam  BP 130/70 (BP Location: Left Arm, Patient Position: Sitting, Cuff Size: Normal)   Pulse 75   Temp 97.7 F (36.5 C) (Temporal)   Ht 5\' 5"  (1.651 m)   Wt 184 lb (83.5 kg)   SpO2 96% Comment: room air  BMI 30.62 kg/m   GEN: A/Ox3; pleasant , NAD, well nourished    HEENT:  [place]/AT,   , NOSE-clear, THROAT-clear, no lesions, no postnasal drip or exudate noted.   NECK:  Supple w/ fair ROM; no JVD; normal carotid impulses w/o bruits; no thyromegaly or nodules palpated; no lymphadenopathy.    RESP  Clear  P & A; w/o, wheezes/ rales/ or rhonchi. no accessory muscle use, no dullness to percussion  CARD:  RRR, no m/r/g, no peripheral edema, pulses intact, no cyanosis or clubbing.  GI:   Soft & nt; nml bowel sounds; no organomegaly or masses  detected.   Musco: Warm bil, no deformities or joint swelling noted.   Neuro: alert, no focal deficits noted.    Skin: Warm, no lesions or rashes    Lab Results:  CBC    Component Value Date/Time   WBC 9.7 09/08/2019 1710   RBC 5.17 09/08/2019 1710   HGB 15.9 09/08/2019 1710   HCT 48.2 09/08/2019 1710   PLT 266.0 09/08/2019 1710   MCV 93.1 09/08/2019 1710   MCHC 33.0 09/08/2019 1710   RDW 15.0 09/08/2019 1710   LYMPHSABS 3.8 09/08/2019 1710   MONOABS 1.1 (H) 09/08/2019 1710   EOSABS 0.4 09/08/2019 1710   BASOSABS 0.1 09/08/2019 1710    BMET    Component Value Date/Time   NA 142 09/08/2019 1710   K 4.3 09/08/2019 1710   CL 104 09/08/2019 1710   CO2 30 09/08/2019 1710   GLUCOSE 110 (H) 09/08/2019 1710   BUN 23 09/08/2019 1710   CREATININE 1.17 09/08/2019 1710   CALCIUM 9.1 09/08/2019 1710    BNP No results found for: BNP  ProBNP    Component Value Date/Time   PROBNP 102.0 (H) 09/08/2019 1710    Imaging: No results found.    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  Patient does have an allergic component.  With elevated IgE and eosinophils.  Patient remains on Singulair.  We will add in Claritin.  Refer to allergy.  Plan  Patient Instructions  Mucinex Twice daily  As needed  Cough/congestion  Delsym 2 tsp Twice daily  For cough , As needed   Sips of water to soothe throat to prevent cough or  throat clearing.  Begin Claritin 10mg  daily.  Continue on Singulair 10mg  daily  Refer to Allergist.  Change Down mattress pad.  Change to allergy pillow , filters etc.    Wear CPAP At bedtime. Try to wear each night for at least 6hrs Do not drive if sleepy .  Work on healthy weight .  Use caution with sedating medications .   Follow up with Dr. Melvyn Novas in 3-4 months and As needed   Please contact office for sooner follow up if symptoms do not improve or worsen or seek emergency care        OSA (obstructive sleep apnea) Recommend improved compliance and usage of CPAP.  Patient education given  Plan  Patient Instructions  Mucinex Twice daily  As needed  Cough/congestion  Delsym 2 tsp Twice daily  For cough , As needed   Sips of water to soothe throat to prevent cough or throat clearing.  Begin Claritin 10mg  daily.  Continue on Singulair 10mg  daily  Refer to Allergist.  Change Down mattress pad.  Change to allergy pillow , filters etc.    Wear CPAP At bedtime. Try to wear each night for at least 6hrs Do not drive if sleepy .  Work on healthy weight .  Use caution with sedating medications .   Follow up with Dr. Melvyn Novas in 3-4 months and As needed   Please contact office for sooner follow up if symptoms do not improve or worsen or seek emergency care        Allergic rhinitis Add Claritin daily.  Continue on Singulair. Refer to allergy        Rexene Edison, NP 04/04/2020

## 2020-04-04 NOTE — Assessment & Plan Note (Signed)
Recommend improved compliance and usage of CPAP.  Patient education given  Plan  Patient Instructions  Mucinex Twice daily  As needed  Cough/congestion  Delsym 2 tsp Twice daily  For cough , As needed   Sips of water to soothe throat to prevent cough or throat clearing.  Begin Claritin 10mg  daily.  Continue on Singulair 10mg  daily  Refer to Allergist.  Change Down mattress pad.  Change to allergy pillow , filters etc.    Wear CPAP At bedtime. Try to wear each night for at least 6hrs Do not drive if sleepy .  Work on healthy weight .  Use caution with sedating medications .   Follow up with Dr. Melvyn Novas in 3-4 months and As needed   Please contact office for sooner follow up if symptoms do not improve or worsen or seek emergency care

## 2020-04-04 NOTE — Assessment & Plan Note (Signed)
Add Claritin daily.  Continue on Singulair. Refer to allergy

## 2020-04-04 NOTE — Patient Instructions (Addendum)
Mucinex Twice daily  As needed  Cough/congestion  Delsym 2 tsp Twice daily  For cough , As needed   Sips of water to soothe throat to prevent cough or throat clearing.  Begin Claritin 10mg  daily.  Continue on Singulair 10mg  daily  Refer to Allergist.  Change Down mattress pad.  Change to allergy pillow , filters etc.    Wear CPAP At bedtime. Try to wear each night for at least 6hrs Do not drive if sleepy .  Work on healthy weight .  Use caution with sedating medications .   Follow up with Dr. Melvyn Novas in 3-4 months and As needed   Please contact office for sooner follow up if symptoms do not improve or worsen or seek emergency care

## 2020-04-04 NOTE — Assessment & Plan Note (Signed)
Chronic cough with chronic rhinitis trigger.  Patient does have an allergic component.  With elevated IgE and eosinophils.  Patient remains on Singulair.  We will add in Claritin.  Refer to allergy.  Plan  Patient Instructions  Mucinex Twice daily  As needed  Cough/congestion  Delsym 2 tsp Twice daily  For cough , As needed   Sips of water to soothe throat to prevent cough or throat clearing.  Begin Claritin 10mg  daily.  Continue on Singulair 10mg  daily  Refer to Allergist.  Change Down mattress pad.  Change to allergy pillow , filters etc.    Wear CPAP At bedtime. Try to wear each night for at least 6hrs Do not drive if sleepy .  Work on healthy weight .  Use caution with sedating medications .   Follow up with Dr. Melvyn Novas in 3-4 months and As needed   Please contact office for sooner follow up if symptoms do not improve or worsen or seek emergency care

## 2020-04-19 NOTE — Progress Notes (Signed)
New Patient Note  RE: Angel Khan MRN: [id number] DOB: Nov 11, 1941 Date of Office Visit: 04/20/2020  Referring provider: Melvenia Needles, NP Primary care provider: Edmonia James, PA-C  Chief Complaint: Cough  History of Present Illness: I had the pleasure of seeing Carlyn Mccollum for initial evaluation at the [facility] on 04/20/2020. He is a 79 y.o. male, who is referred here by pulmonology for the evaluation of elevated IgE and chronic cough.  Patient has been seen by pulmonology, GI, ENT, cardiology.   He reports symptoms of dry coughing with sticky phlegm in the throat, shortness of breath, wheezing, nocturnal awakenings for 3 years. No seasonal variations but the phlegm is worse in the mornings.  Current medications include none. He reports not using aerochamber with inhalers. He tried the following inhalers: Advair, Dulera, Symbicort, azithromycin, flutter valve with no benefit. Main triggers unknown. In the last month, frequency of symptoms: daily. Frequency of nocturnal symptoms: 0x/month. Frequency of SABA use: 0x/week. Interference with physical activity: no. Sleep is undisturbed. In the last 12 months, emergency room visits/urgent care visits/doctor office visits or hospitalizations due to respiratory issues: few times. In the last 12 months, oral steroids courses: few times with unknown benefit. Lifetime history of hospitalization for respiratory issues: no. Prior intubations: no. History of pneumonia: no. He was evaluated by pulmonologist in the past. Smoking exposure: quit in 1980s, 1.5 pack per day x 20 yrs. Up to date with flu vaccine: no. Up to date with pneumonia vaccine: yes.  COVID-19 vaccine: yes History of reflux: yes and takes medications and had EGD done in 2018.   09/08/2019 CXR "IMPRESSION: Mild hypoinflation without acute cardiopulmonary disease."  He reports symptoms of nasal congestion, rhinorrhea. Symptoms have been going on for 20  years. The symptoms are present all year around with worsening in winter months. Other triggers include exposure to denies. Anosmia: no. Headache: yes. He has used saline with minimal improvement in symptoms. Sinus infections: yes. Previous work up includes: 2016 bloodwork IgE 610, positive to cat, dog, grass, dust mites, trees, ragweed, weed. 09/08/2019 eosinophils 400.  Previous ENT evaluation: sinus surgery over 20 years ago with some benefit. Saw ENT about 1 year ago.  History of nasal polyps: denies.  Patient has OSA and is on CPAP. History of tracheobronchomalacia.   Assessment and Plan: [name] is a 79 y.o. male with: Chronic cough Chronic dry cough with sticky phlegm for 3 years. Worse in the mornings. Followed by pulmonology, GI, ENT and cardiology. Tried Advair, Dulera, Symbicort, azithromycin and flutter valve with no benefit. Currently on PPI for reflux. 2020 CXR no acute disease. 2016 IgE 610 positive to cat, dog, grass, dust mites, trees, ragweed, weed. 09/08/2019 eosinophils 400.   Today's spirometry showed some mild restriction.   Today's skin testing was positive to grass and dog. No pets at home.  Discussed with patient that I don't think the above allergens are contributing to his symptoms at this time.  Concerned if there's a post nasal drip component due to worsening in the morning with the phlegm in the throat.  Start saline irrigation twice a day - instructions given.   Use ipratropium 0.06% nasal spray 2 sprays per nostril three times a day as needed for drainage.   Start environmental control measures.   May use over the counter antihistamines such as Zyrtec (cetirizine), Claritin (loratadine), Allegra (fexofenadine), or Xyzal (levocetirizine) daily as needed.  Other allergic rhinitis Perennial rhinitis symptoms for 20 years.  Tried some type of nasal saline with no benefit. 2016 bloodwork IgE 610, positive to cat, dog, grass, dust mites, trees, ragweed, weed. 09/08/2019  eosinophils 400.   Today's skin testing positive to grass and dog only.  Start saline irrigation twice a day.   Use ipratropium 0.06% nasal spray 2 sprays per nostril three times a day as needed for drainage.   Start environmental control measures.   May use over the counter antihistamines such as Zyrtec (cetirizine), Claritin (loratadine), Allegra (fexofenadine), or Xyzal (levocetirizine) daily as needed.  Gastroesophageal reflux disease Continue Protonix as prescribed.   Return in about 4 weeks (around 05/18/2020).  Meds ordered this encounter  Medications  . ipratropium (ATROVENT) 0.06 % nasal spray    Sig: Place 2 sprays into both nostrils 3 (three) times daily as needed for rhinitis (runny nose).    Dispense:  15 mL    Refill:  5   Other allergy screening: Food allergy: no Medication allergy: yes Hymenoptera allergy: no Urticaria: no Eczema:no History of recurrent infections suggestive of immunodeficency: no  Diagnostics: Spirometry:  Tracings reviewed. His effort: Good reproducible efforts. FVC: 2.48L FEV1: 1.96L, 78% predicted FEV1/FVC ratio: 79% Interpretation: Spirometry consistent with possible restrictive disease.  Please see scanned spirometry results for details.  Skin Testing: Environmental allergy panel and select foods. Positive test to: grass and dog. Negative test to: common foods.  Results discussed with patient/family. Airborne Adult Perc - 04/20/20 0942    Time Antigen Placed  [id number]    Allergen Manufacturer  Lavella Hammock    Location  Back    Number of Test  59    Panel 1  Select    1. Control-Buffer 50% Glycerol  Negative    2. Control-Histamine 1 mg/ml  2+    3. Albumin saline  Negative    4. [place]  Negative    5. [place]  Negative    6. Johnson  Negative    7. [place]  4+    8. Meadow Fescue  2+    9. Perennial Rye  2+    10. Sweet Vernal  Negative    11. Timothy  Negative    12. Cocklebur  Negative    13. Burweed Marshelder  Negative     14. Ragweed, short  Negative    15. Ragweed, Giant  Negative    16. Plantain,  English  Negative    17. Lamb's Quarters  Negative    18. Sheep Sorrell  Negative    19. Rough Pigweed  Negative    20. Marsh Elder, Rough  Negative    21. Mugwort, Common  2+    22. Ash mix  Negative    23. Birch mix  Negative    24. Beech American  Negative    25. Box, Elder  Negative    26. Cedar, red  Negative    27. Cottonwood, [place]  Negative    28. Elm mix  Negative    29. Hickory mix  Negative    30. Maple mix  Negative    31. Oak, [place] mix  Negative    32. Pecan Pollen  Negative    33. Pine mix  Negative    34. Sycamore Eastern  Negative    35. [place], Black Pollen  Negative    36. Alternaria alternata  Negative    37. Cladosporium Herbarum  Negative    38. Aspergillus mix  Negative    39. Penicillium mix  Negative  40. Bipolaris sorokiniana (Helminthosporium)  Negative    41. Drechslera spicifera (Curvularia)  Negative    42. Mucor plumbeus  Negative    43. Fusarium moniliforme  Negative    44. Aureobasidium pullulans (pullulara)  Negative    45. Rhizopus oryzae  Negative    46. Botrytis cinera  Negative    47. Epicoccum nigrum  Negative    48. Phoma betae  Negative    49. Candida Albicans  Negative    50. Trichophyton mentagrophytes  Negative    51. Mite, D Farinae  5,000 AU/ml  Negative    52. Mite, D Pteronyssinus  5,000 AU/ml  Negative    53. Cat Hair 10,000 BAU/ml  Negative    54.  Dog Epithelia  Negative    55. Mixed Feathers  Negative    56. Horse Epithelia  Negative    57. Cockroach, German  Negative    58. Mouse  Negative    59. Tobacco Leaf  Negative     Intradermal - 04/20/20 1034    Time Antigen Placed  1034    Allergen Manufacturer  Lavella Hammock    Location  Back    Number of Test  14    Intradermal  Select    Control  Negative    Guatem[place]  Negative    Johnson  Negative    Ragweed mix  Negative    Weed mix  Negative    Tree mix  Negative    Mold 1   Negative    Mold 2  Negative    Mold 3  Negative    Mold 4  Negative    Cat  Negative    Dog  2+    Cockroach  Negative    Mite mix  Negative     Food Adult Perc - 04/20/20 0900    Time Antigen Placed  [id number]    Allergen Manufacturer  Lavella Hammock    Location  Back    Number of allergen test  10    1. Peanut  Negative    2. Soybean  Negative    3. Wheat  Negative    4. Sesame  Negative    5. Milk, cow  Negative    6. Egg White, Chicken  Negative    7. Casein  Negative    8. Shellfish Mix  Negative    9. Fish Mix  Negative    10. Cashew  Negative       Past Medical History: Patient Active Problem List   Diagnosis Date Noted  . Gastroesophageal reflux disease 04/20/2020  . OSA (obstructive sleep apnea) 04/04/2020  . Other allergic rhinitis 04/04/2020  . DOE (dyspnea on exertion) 08/04/2019  . 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 Diarrhea 03/22/2015  . Chronic cough 12/23/2013  . Essential hypertension 12/23/2013  . CORONARY ARTERY DISEASE, S/P PTCA 10/08/2010  . 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 COLONIC POLYPS, ADENOMATOUS, HX OF 10/08/2010   Past Medical History:  Diagnosis Date  . Adenomatous colon polyp   . Barrett esophagus   . CAD (coronary artery disease)   . Chronic headaches   . COPD (chronic obstructive pulmonary disease) ([place])   . Coronary atherosclerosis   . Diverticulitis of colon December 2012   Diagnosed by CT scan [facility]  . DM (diabetes mellitus) ([place])   . GERD (gastroesophageal reflux disease)   . Hemorrhoids   . History of kidney stones   .  HLD (hyperlipidemia)   . Horseshoe kidney   . HTN (hypertension)   . Melanoma ([place])   . Poor short term memory   . Tracheobronchomalacia    Past Surgical History: Past Surgical History:  Procedure Laterality Date  . CHOLECYSTECTOMY  01/12/2013   Dr. Gerlene Fee  . COLONOSCOPY W/ BIOPSIES AND POLYPECTOMY  09/08/2007   adenomatous polyps,  diverticulosis, external hemorrhoids  . CORONARY ARTERY BYPASS GRAFT    . CORONARY STENT PLACEMENT     x 5  . TONSILLECTOMY    . UMBILICAL HERNIA REPAIR    . UPPER GASTROINTESTINAL ENDOSCOPY  10/12/2010   barrett's, fondic gland polyps, duodenitis   Medication List:  Current Outpatient Medications  Medication Sig Dispense Refill  . aspirin 325 MG EC tablet Take 325 mg by mouth at bedtime.     . benzonatate (TESSALON) 200 MG capsule Take 1 capsule (200 mg total) by mouth 3 (three) times daily as needed for cough. 90 capsule 1  . clopidogrel (PLAVIX) 75 MG tablet Take 75 mg by mouth at bedtime.     [name] ezetimibe (ZETIA) 10 MG tablet Take 10 mg by mouth every morning.     . famotidine (PEPCID) 20 MG tablet Take 20 mg by mouth daily.     [name] guaiFENesin (MUCINEX) 600 MG 12 hr tablet Take 1 tablet (600 mg total) by mouth 2 (two) times daily as needed. 60 tablet 5  . HYDROcodone-acetaminophen (NORCO) 7.5-325 MG tablet Take 1 tablet by mouth 2 (two) times daily as needed.    . isosorbide mononitrate (IMDUR) 30 MG 24 hr tablet Take 30 mg by mouth at bedtime.     [name] losartan (COZAAR) 100 MG tablet Take 100 mg by mouth daily.    [name] MAGNESIUM PO Take 1 tablet by mouth daily.    . montelukast (SINGULAIR) 10 MG tablet Take 1 tablet (10 mg total) by mouth at bedtime. 30 tablet 11  . pantoprazole (PROTONIX) 40 MG tablet Take 1 tablet (40 mg total) by mouth daily. 90 tablet   . Potassium 99 MG TABS Take 1 tablet by mouth daily.    [name] Respiratory Therapy Supplies (FLUTTER) DEVI by Does not apply route as directed.    . sertraline (ZOLOFT) 25 MG tablet Take 50 mg by mouth at bedtime.     . vitamin C (ASCORBIC ACID) 500 MG tablet Take 500 mg by mouth daily.    . chlorpheniramine (CHLOR-TRIMETON) 4 MG tablet Take 1 tablet (4 mg total) by mouth at bedtime. (Patient not taking: Reported on 04/20/2020) 30 tablet 5  . dicyclomine (BENTYL) 20 MG tablet Take 1 tablet (20 mg total) by mouth 2 (two) times daily. (Patient  not taking: Reported on 04/20/2020) 60 tablet 1  . ipratropium (ATROVENT) 0.06 % nasal spray Place 2 sprays into both nostrils 3 (three) times daily as needed for rhinitis (runny nose). 15 mL 5   No current facility-administered medications for this visit.   Allergies: Allergies  Allergen Reactions  . Depakote [Divalproex Sodium]     Hair loss   . Other Other (See Comments)    Hair loss, rash Liquid Magnesium  . Propranolol Other (See Comments)    Muscle cramps  . Statins Other (See Comments)    Muscle cramps   Social History: Social History   Socioeconomic History  . Marital status: Married    Spouse name: Not on file  . Number of children: 3  . Years of education: Not on file  .  Highest education level: Not on file  Occupational History  . Occupation: retired  Tobacco Use  . Smoking status: Former Smoker    Packs/day: 1.00    Years: 40.00    Pack years: 40.00    Types: Cigarettes    Quit date: 07/30/1976    Years since quitting: 43.7  . Smokeless tobacco: Never Used  Substance and Sexual Activity  . Alcohol use: No  . Drug use: No  . Sexual activity: Not on file  Other Topics Concern  . Not on file  Social History Narrative   Married has 3 children he is retired   Former smoker, no alcohol tobacco or drug use now   Social Determinants of Radio broadcast assistant Strain:   . Difficulty of Paying Living Expenses:   Food Insecurity:   . Worried About Charity fundraiser in the Last Year:   . Arboriculturist in the Last Year:   Transportation Needs:   . Film/video editor (Medical):   [name] Lack of Transportation (Non-Medical):   Physical Activity:   . Days of Exercise per Week:   . Minutes of Exercise per Session:   Stress:   . Feeling of Stress :   Social Connections:   . Frequency of Communication with Friends and Family:   . Frequency of Social Gatherings with Friends and Family:   . Attends Religious Services:   . Active Member of Clubs or  Organizations:   . Attends Archivist Meetings:   [name] Marital Status:    Lives in a 78 year old home. Smoking: quit in 1980s, 1.5 pack per day x 20 yrs Occupation: retired  Programme researcher, broadcasting/film/video History: Environmental education officer in the house: no Charity fundraiser in the family room: yes Carpet in the bedroom: yes Heating: electric Cooling: central Pet: no  Family History: Family History  Problem Relation Age of Onset  . Colitis Other        niece  . Diabetes Mother   . Heart disease Mother   . Heart disease Father   . Heart disease Brother   . Rheumatic fever Sister   . Colon cancer Neg Hx   . Stomach cancer Neg Hx   . Pancreatic cancer Neg Hx    Problem                               Relation Asthma                                   No COPD    Brother   Review of Systems  Constitutional: Negative for appetite change, chills, fever and unexpected weight change.  HENT: Positive for congestion and rhinorrhea.   Eyes: Negative for itching.  Respiratory: Positive for cough, shortness of breath and wheezing. Negative for chest tightness.   Cardiovascular: Negative for chest pain.  Gastrointestinal: Negative for abdominal pain.  Genitourinary: Negative for difficulty urinating.  Skin: Negative for rash.  Allergic/Immunologic: Positive for environmental allergies. Negative for food allergies.  Neurological: Negative for headaches.   Objective: BP 116/62 (BP Location: Left Arm, Patient Position: Sitting, Cuff Size: Normal)   Pulse 77   Temp (!) 97.3 F (36.3 C) (Temporal)   Resp 17   Ht 5' 5.5" (1.664 m)   Wt 184 lb 12 oz (83.8 kg)   SpO2 97%  BMI 30.28 kg/m  Body mass index is 30.28 kg/m. Physical Exam  Constitutional: He is oriented to person, place, and time. He appears well-developed and well-nourished.  HENT:  Head: Normocephalic and atraumatic.  Right Ear: External ear normal.  Left Ear: External ear normal.  Nose: Nose normal.  Mouth/Throat: Oropharynx is clear and  moist.  Eyes: Conjunctivae and EOM are normal.  Cardiovascular: Normal rate, regular rhythm and normal heart sounds. Exam reveals no gallop and no friction rub.  No murmur heard. Pulmonary/Chest: Effort normal and breath sounds normal. He has no wheezes. He has no rales.  Abdominal: Soft.  Musculoskeletal:     Cervical back: Neck supple.  Neurological: He is alert and oriented to person, place, and time.  Skin: Skin is warm. No rash noted.  Psychiatric: He has a normal mood and affect. His behavior is normal.  Nursing note and vitals reviewed.  The plan was reviewed with the patient/family, and all questions/concerned were addressed.  It was my pleasure to see Alexender today and participate in his care. Please feel free to contact me with any questions or concerns.  Sincerely,  Rexene Alberts, DO Allergy & Immunology  [facility] office: [phone] [facility] office: [place] office: [phone]

## 2020-04-20 ENCOUNTER — Ambulatory Visit (INDEPENDENT_AMBULATORY_CARE_PROVIDER_SITE_OTHER): Payer: Medicare Other | Admitting: Allergy

## 2020-04-20 ENCOUNTER — Encounter: Payer: Self-pay | Admitting: Allergy

## 2020-04-20 ENCOUNTER — Other Ambulatory Visit: Payer: Self-pay

## 2020-04-20 VITALS — BP 116/62 | HR 77 | Temp 97.3°F | Resp 17 | Ht 65.5 in | Wt 184.8 lb

## 2020-04-20 DIAGNOSIS — R05 Cough: Secondary | ICD-10-CM

## 2020-04-20 DIAGNOSIS — J3089 Other allergic rhinitis: Secondary | ICD-10-CM

## 2020-04-20 DIAGNOSIS — K219 Gastro-esophageal reflux disease without esophagitis: Secondary | ICD-10-CM | POA: Diagnosis not present

## 2020-04-20 DIAGNOSIS — R053 Chronic cough: Secondary | ICD-10-CM

## 2020-04-20 MED ORDER — IPRATROPIUM BROMIDE 0.06 % NA SOLN: 2.0000 | Freq: Three times a day (TID) | NASAL | 5 refills | Status: DC | PRN

## 2020-04-20 NOTE — Assessment & Plan Note (Signed)
Perennial rhinitis symptoms for 20 years. Tried some type of nasal saline with no benefit. 2016 bloodwork IgE 610, positive to cat, dog, grass, dust mites, trees, ragweed, weed. 09/08/2019 eosinophils 400.   Today's skin testing positive to grass and dog only.  Start saline irrigation twice a day.   Use ipratropium 0.06% nasal spray 2 sprays per nostril three times a day as needed for drainage.   Start environmental control measures.   May use over the counter antihistamines such as Zyrtec (cetirizine), Claritin (loratadine), Allegra (fexofenadine), or Xyzal (levocetirizine) daily as needed.

## 2020-04-20 NOTE — Patient Instructions (Addendum)
Today's skin testing showed:  Positive to grass and dog.    Start saline irrigation twice a day.   Then use the nasal spray as below.   Use ipratropium 0.06% nasal spray 2 sprays per nostril three times a day as needed for drainage.   Start environmental control measures.   May use over the counter antihistamines such as Zyrtec (cetirizine), Claritin (loratadine), Allegra (fexofenadine), or Xyzal (levocetirizine) daily as needed.  Follow up in 1 month or sooner if needed.   Reducing Pollen Exposure . Pollen seasons: trees (spring), grass (summer) and ragweed/weeds (fall). [name] Keep windows closed in your home and car to lower pollen exposure.  Susa Simmonds air conditioning in the bedroom and throughout the house if possible.  . Avoid going out in dry windy days - especially early morning. . Pollen counts are highest between 5 - 10 AM and on dry, hot and windy days.  . Save outside activities for late afternoon or after a heavy rain, when pollen levels are lower.  . Avoid mowing of grass if you have grass pollen allergy. [name] Be aware that pollen can also be transported indoors on people and pets.  . Dry your clothes in an automatic dryer rather than hanging them outside where they might collect pollen.  . Rinse hair and eyes before bedtime.  Pet Allergen Avoidance: . Contrary to popular opinion, there are no "hypoallergenic" breeds of dogs or cats. That is because people are not allergic to an animal's hair, but to an allergen found in the animal's saliva, dander (dead skin flakes) or urine. Pet allergy symptoms typically occur within minutes. For some people, symptoms can build up and become most severe 8 to 12 hours after contact with the animal. People with severe allergies can experience reactions in public places if dander has been transported on the pet owners' clothing. [name] Keeping an animal outdoors is only a partial solution, since homes with pets in the yard still have higher  concentrations of animal allergens. . Before getting a pet, ask your allergist to determine if you are allergic to animals. If your pet is already considered part of your family, try to minimize contact and keep the pet out of the bedroom and other rooms where you spend a great deal of time. . As with dust mites, vacuum carpets often or replace carpet with a hardwood floor, tile or linoleum. . High-efficiency particulate air (HEPA) cleaners can reduce allergen levels over time. . While dander and saliva are the source of cat and dog allergens, urine is the source of allergens from rabbits, hamsters, mice and [place] pigs; so ask a non-allergic family member to clean the animal's cage. . If you have a pet allergy, talk to your allergist about the potential for allergy immunotherapy (allergy shots). This strategy can often provide long-term relief.  Buffered Isotonic Saline Irrigations:  Goal: . When you irrigate with the isotonic saline (salt water) it washes mucous and other debris from your nose that could be contributing to your nasal symptoms.   Recipe: [name] Obtain 1 quart jar that is clean . Fill with clean (bottled, boiled or distilled) water . Add 1-2 heaping teaspoons of salt without iodine o If the solution with 2 teaspoons of salt is too strong, adjust the amount down until better tolerated . Add 1 teaspoon of [employer] baking soda (pure bicarbonate) . Mix ingredients together and store at room temperature and discard after 1 week * Alternatively you can buy pre  made salt packets for the [employer] bottle or there          are other over the counter brands available  Instructions: . Warm  cup of the solution in the microwave if desired but be careful not to overheat as this will burn the inside of your nose . Stand over a sink (or do it while you shower) and squirt the solution into one side of your nose aiming towards the back of your head o Sometimes saying "coca cola" while  irrigating can be helpful to prevent fluid from going down your throat  . The solution will travel to the back of your nose and then come out the other side . Perform this again on the other side . Try to do this twice a day . If you are using a nasal spray in addition to the irrigation, irrigate first and then use the topical nasal spray otherwise you will wash the nasal spray out of your nose

## 2020-04-20 NOTE — Assessment & Plan Note (Signed)
Chronic dry cough with sticky phlegm for 3 years. Worse in the mornings. Followed by pulmonology, GI, ENT and cardiology. Tried Advair, Dulera, Symbicort, azithromycin and flutter valve with no benefit. Currently on PPI for reflux. 2020 CXR no acute disease. 2016 IgE 610 positive to cat, dog, grass, dust mites, trees, ragweed, weed. 09/08/2019 eosinophils 400.   Today's spirometry showed some mild restriction.   Today's skin testing was positive to grass and dog. No pets at home.  Discussed with patient that I don't think the above allergens are contributing to his symptoms at this time.  Concerned if there's a post nasal drip component due to worsening in the morning with the phlegm in the throat.  Start saline irrigation twice a day - instructions given.   Use ipratropium 0.06% nasal spray 2 sprays per nostril three times a day as needed for drainage.   Start environmental control measures.   May use over the counter antihistamines such as Zyrtec (cetirizine), Claritin (loratadine), Allegra (fexofenadine), or Xyzal (levocetirizine) daily as needed.

## 2020-05-18 ENCOUNTER — Ambulatory Visit: Payer: Medicare Other | Admitting: Allergy

## 2020-06-06 ENCOUNTER — Encounter: Payer: Self-pay | Admitting: Allergy

## 2020-06-06 ENCOUNTER — Ambulatory Visit (INDEPENDENT_AMBULATORY_CARE_PROVIDER_SITE_OTHER): Payer: Medicare Other | Admitting: Allergy

## 2020-06-06 ENCOUNTER — Other Ambulatory Visit: Payer: Self-pay

## 2020-06-06 VITALS — BP 120/70 | HR 80 | Temp 97.1°F | Resp 17

## 2020-06-06 DIAGNOSIS — K219 Gastro-esophageal reflux disease without esophagitis: Secondary | ICD-10-CM | POA: Diagnosis not present

## 2020-06-06 DIAGNOSIS — J302 Other seasonal allergic rhinitis: Secondary | ICD-10-CM

## 2020-06-06 DIAGNOSIS — J3089 Other allergic rhinitis: Secondary | ICD-10-CM

## 2020-06-06 DIAGNOSIS — R05 Cough: Secondary | ICD-10-CM | POA: Diagnosis not present

## 2020-06-06 DIAGNOSIS — R053 Chronic cough: Secondary | ICD-10-CM

## 2020-06-06 NOTE — Patient Instructions (Addendum)
Past skin testing showed: Positive to grass and dog.   Use ipratropium 0.06% nasal spray 1-2 sprays per nostril up to three times a day as needed for drainage.   If it causes nosebleeds then let us know and stop using the nasal spray.   If the nasal spray does not work, then try using over the counter antihistamines such as Zyrtec (cetirizine), Claritin (loratadine), Allegra (fexofenadine), or Xyzal (levocetirizine) daily as needed.  Continue environmental control measures.   Spirometry was unchanged.   Follow up as needed.   Reducing Pollen Exposure . Pollen seasons: trees (spring), grass (summer) and ragweed/weeds (fall). [name] Keep windows closed in your home and car to lower pollen exposure.  Susa Simmonds air conditioning in the bedroom and throughout the house if possible.  . Avoid going out in dry windy days - especially early morning. . Pollen counts are highest between 5 - 10 AM and on dry, hot and windy days.  . Save outside activities for late afternoon or after a heavy rain, when pollen levels are lower.  . Avoid mowing of grass if you have grass pollen allergy. [name] Be aware that pollen can also be transported indoors on people and pets.  . Dry your clothes in an automatic dryer rather than hanging them outside where they might collect pollen.  . Rinse hair and eyes before bedtime.  Pet Allergen Avoidance: . Contrary to popular opinion, there are no "hypoallergenic" breeds of dogs or cats. That is because people are not allergic to an animal's hair, but to an allergen found in the animal's saliva, dander (dead skin flakes) or urine. Pet allergy symptoms typically occur within minutes. For some people, symptoms can build up and become most severe 8 to 12 hours after contact with the animal. People with severe allergies can experience reactions in public places if dander has been transported on the pet owners' clothing. [name] Keeping an animal outdoors is only a partial solution, since homes  with pets in the yard still have higher concentrations of animal allergens. . Before getting a pet, ask your allergist to determine if you are allergic to animals. If your pet is already considered part of your family, try to minimize contact and keep the pet out of the bedroom and other rooms where you spend a great deal of time. . As with dust mites, vacuum carpets often or replace carpet with a hardwood floor, tile or linoleum. . High-efficiency particulate air (HEPA) cleaners can reduce allergen levels over time. . While dander and saliva are the source of cat and dog allergens, urine is the source of allergens from rabbits, hamsters, mice and [place] pigs; so ask a non-allergic family member to clean the animal's cage. . If you have a pet allergy, talk to your allergist about the potential for allergy immunotherapy (allergy shots). This strategy can often provide long-term relief.

## 2020-06-06 NOTE — Progress Notes (Signed)
Follow Up Note  RE: Angel Khan MRN: [id number] DOB: Aug 13, 1941 Date of Office Visit: 06/06/2020  Referring provider: Edmonia James, PA-C Primary care provider: Edmonia James, PA-C  Chief Complaint: Cough  History of Present Illness: I had the pleasure of seeing Angel Khan for a follow up visit at the [facility] on 06/06/2020. He is a 79 y.o. male, who is being followed for chronic cough, allergic rhinitis. His previous allergy office visit was on 04/20/2020 with Dr. Maudie Mercury. Today is a regular follow up visit.  Chronic cough/allergic rhinitis Still coughing daily but it is about 25% improved.  Did not try ipratropium nasal spray yet as was concerned about spraying "chemicals" up in his nose.  Tried saline irrigations with no benefit.  Allegra did not help.   Currently on antibiotics for a tick bite.  Gastroesophageal reflux disease Stable with PPI.  Assessment and Plan: Angel Khan is a 79 y.o. male with: Chronic cough Past history - Chronic dry cough with sticky phlegm for 3 years. Worse in the mornings. Followed by pulmonology, GI, ENT and cardiology. Tried Advair, Dulera, Symbicort, azithromycin and flutter valve with no benefit. Currently on PPI for reflux. 2020 CXR no acute disease. 2016 IgE 610 positive to cat, dog, grass, dust mites, trees, ragweed, weed. 09/08/2019 eosinophils 400. 2021 spirometry showed some mild restriction. 2021 skin testing was positive to grass and dog. No pets at home. Interim history - 25% improvement since last visit but did not try nasal spray. Saline sinus rinse not effective so stopped. Not taking antihistamines either.   Use ipratropium 0.06% nasal spray 1-2 sprays per nostril up to three times a day as needed for drainage.   If it causes nosebleeds then let us know and stop using the nasal spray.   If the nasal spray does not work, then try using over the counter antihistamines such as Zyrtec (cetirizine), Claritin  (loratadine), Allegra (fexofenadine), or Xyzal (levocetirizine) daily as needed - Xyzal samples given.   Continue environmental control measures.   Spirometry was unchanged today.   Seasonal and perennial allergic rhinitis Past history - Perennial rhinitis symptoms for 20 years. Tried some type of nasal saline with no benefit. 2016 bloodwork IgE 610, positive to cat, dog, grass, dust mites, trees, ragweed, weed. 09/08/2019 eosinophils 400. 2021 skin testing positive to grass and dog only. Interim history - still having drainage but did not try nasal spray.   Use ipratropium 0.06% nasal spray 1-2 sprays per nostril up to three times a day as needed for drainage.   If it causes nosebleeds then let us know and stop using the nasal spray.   If the nasal spray does not work, then try using over the counter antihistamines such as Zyrtec (cetirizine), Claritin (loratadine), Allegra (fexofenadine), or Xyzal (levocetirizine) daily as needed.  Continue environmental control measures.   Gastroesophageal reflux disease  Continue Protonix as prescribed.   Return if symptoms worsen or fail to improve.  Diagnostics: Spirometry:  Tracings reviewed. His effort: Good reproducible efforts. FVC: 2.55L FEV1: 2.05L, 82% predicted FEV1/FVC ratio: 80% Interpretation: Spirometry consistent with possible restrictive disease.  Please see scanned spirometry results for details.  Medication List:  Current Outpatient Medications  Medication Sig Dispense Refill  . aspirin 325 MG EC tablet Take 325 mg by mouth at bedtime.     . clopidogrel (PLAVIX) 75 MG tablet Take 75 mg by mouth at bedtime.     [name] doxycycline (VIBRA-TABS) 100 MG tablet Take 100  mg by mouth 2 (two) times daily.    [name] ezetimibe (ZETIA) 10 MG tablet Take 10 mg by mouth every morning.     . famotidine (PEPCID) 20 MG tablet Take 20 mg by mouth daily.     [name] guaiFENesin (MUCINEX) 600 MG 12 hr tablet Take 1 tablet (600 mg total) by mouth 2 (two)  times daily as needed. 60 tablet 5  . HYDROcodone-acetaminophen (NORCO) 7.5-325 MG tablet Take 1 tablet by mouth 2 (two) times daily as needed.    [name] ipratropium (ATROVENT) 0.06 % nasal spray Place 2 sprays into both nostrils 3 (three) times daily as needed for rhinitis (runny nose). 15 mL 5  . isosorbide mononitrate (IMDUR) 30 MG 24 hr tablet Take 30 mg by mouth at bedtime.     [name] losartan (COZAAR) 100 MG tablet Take 100 mg by mouth daily.    [name] MAGNESIUM PO Take 1 tablet by mouth daily.    . montelukast (SINGULAIR) 10 MG tablet Take 1 tablet (10 mg total) by mouth at bedtime. 30 tablet 11  . mupirocin ointment (BACTROBAN) 2 % SMARTSIG:1 Application Topical 2-3 Times Daily    . pantoprazole (PROTONIX) 40 MG tablet Take 1 tablet (40 mg total) by mouth daily. 90 tablet   . Potassium 99 MG TABS Take 1 tablet by mouth daily.    [name] Respiratory Therapy Supplies (FLUTTER) DEVI by Does not apply route as directed.    . sertraline (ZOLOFT) 50 MG tablet Take 50 mg by mouth daily.    . vitamin C (ASCORBIC ACID) 500 MG tablet Take 500 mg by mouth daily.    . chlorpheniramine (CHLOR-TRIMETON) 4 MG tablet Take 1 tablet (4 mg total) by mouth at bedtime. (Patient not taking: Reported on 04/20/2020) 30 tablet 5  . dicyclomine (BENTYL) 20 MG tablet Take 1 tablet (20 mg total) by mouth 2 (two) times daily. (Patient not taking: Reported on 04/20/2020) 60 tablet 1   No current facility-administered medications for this visit.   Allergies: Allergies  Allergen Reactions  . Depakote [Divalproex Sodium]     Hair loss   . Other Other (See Comments)    Hair loss, rash Liquid Magnesium  . Propranolol Other (See Comments)    Muscle cramps  . Statins Other (See Comments)    Muscle cramps   I reviewed his past medical history, social history, family history, and environmental history and no significant changes have been reported from his previous visit.  Review of Systems  Constitutional: Negative for appetite  change, chills, fever and unexpected weight change.  HENT: Positive for postnasal drip and rhinorrhea.   Eyes: Negative for itching.  Respiratory: Positive for cough. Negative for chest tightness.   Cardiovascular: Negative for chest pain.  Gastrointestinal: Negative for abdominal pain.  Genitourinary: Negative for difficulty urinating.  Skin: Negative for rash.  Allergic/Immunologic: Positive for environmental allergies. Negative for food allergies.  Neurological: Negative for headaches.   Objective: BP 120/70 (BP Location: Left Arm, Patient Position: Sitting, Cuff Size: Normal)   Pulse 80   Temp (!) 97.1 F (36.2 C) (Temporal)   Resp 17   SpO2 96%  There is no height or weight on file to calculate BMI. Physical Exam  Constitutional: He is oriented to person, place, and time. He appears well-developed and well-nourished.  HENT:  Head: Normocephalic and atraumatic.  Right Ear: External ear normal.  Left Ear: External ear normal.  Nose: Nose normal.  Mouth/Throat: Oropharynx is clear and moist.  Eyes: Conjunctivae  and EOM are normal.  Cardiovascular: Normal rate, regular rhythm and normal heart sounds. Exam reveals no gallop and no friction rub.  No murmur heard. Pulmonary/Chest: Effort normal and breath sounds normal. He has no wheezes. He has no rales.  Abdominal: Soft.  Musculoskeletal:     Cervical back: Neck supple.  Neurological: He is alert and oriented to person, place, and time.  Skin: Skin is warm. No rash noted.  Psychiatric: He has a normal mood and affect. His behavior is normal.  Nursing note and vitals reviewed.  Previous notes and tests were reviewed. The plan was reviewed with the patient/family, and all questions/concerned were addressed.  It was my pleasure to see Angel Khan today and participate in his care. Please feel free to contact me with any questions or concerns.  Sincerely,  Rexene Alberts, DO Allergy & Immunology  [facility] office: [phone] [facility] office: [place] office: [phone]

## 2020-06-06 NOTE — Assessment & Plan Note (Signed)
   Continue Protonix as prescribed.

## 2020-06-06 NOTE — Assessment & Plan Note (Signed)
Past history - Perennial rhinitis symptoms for 20 years. Tried some type of nasal saline with no benefit. 2016 bloodwork IgE 610, positive to cat, dog, grass, dust mites, trees, ragweed, weed. 09/08/2019 eosinophils 400. 2021 skin testing positive to grass and dog only. Interim history - still having drainage but did not try nasal spray.   Use ipratropium 0.06% nasal spray 1-2 sprays per nostril up to three times a day as needed for drainage.   If it causes nosebleeds then let us know and stop using the nasal spray.   If the nasal spray does not work, then try using over the counter antihistamines such as Zyrtec (cetirizine), Claritin (loratadine), Allegra (fexofenadine), or Xyzal (levocetirizine) daily as needed.  Continue environmental control measures.

## 2020-06-06 NOTE — Assessment & Plan Note (Signed)
Past history - Chronic dry cough with sticky phlegm for 3 years. Worse in the mornings. Followed by pulmonology, GI, ENT and cardiology. Tried Advair, Dulera, Symbicort, azithromycin and flutter valve with no benefit. Currently on PPI for reflux. 2020 CXR no acute disease. 2016 IgE 610 positive to cat, dog, grass, dust mites, trees, ragweed, weed. 09/08/2019 eosinophils 400. 2021 spirometry showed some mild restriction. 2021 skin testing was positive to grass and dog. No pets at home. Interim history - 25% improvement since last visit but did not try nasal spray. Saline sinus rinse not effective so stopped. Not taking antihistamines either.   Use ipratropium 0.06% nasal spray 1-2 sprays per nostril up to three times a day as needed for drainage.   If it causes nosebleeds then let us know and stop using the nasal spray.   If the nasal spray does not work, then try using over the counter antihistamines such as Zyrtec (cetirizine), Claritin (loratadine), Allegra (fexofenadine), or Xyzal (levocetirizine) daily as needed - Xyzal samples given.   Continue environmental control measures.   Spirometry was unchanged today.

## 2020-07-13 ENCOUNTER — Ambulatory Visit: Payer: Medicare Other | Admitting: Internal Medicine

## 2020-08-01 ENCOUNTER — Ambulatory Visit: Payer: Medicare Other | Admitting: Internal Medicine

## 2020-10-24 ENCOUNTER — Telehealth: Payer: Self-pay | Admitting: Allergy

## 2020-10-24 NOTE — Telephone Encounter (Signed)
Patient called this morning, 10/24/20, and he has a question about his nose drops.

## 2020-10-24 NOTE — Telephone Encounter (Signed)
Pt just wanted to know what the name of his nasal spray was because he hasn't used it in a while.

## 2020-12-14 ENCOUNTER — Telehealth: Payer: Self-pay | Admitting: Allergy

## 2020-12-14 MED ORDER — IPRATROPIUM BROMIDE 0.06 % NA SOLN: 2.0000 | Freq: Three times a day (TID) | NASAL | 5 refills | Status: DC | PRN

## 2020-12-14 NOTE — Telephone Encounter (Signed)
Patient called and wanted to know the name of his nasal spray. I saw in his office notes, he was on Atrovent. He said if that is the correct nasal spray, he needs a refill on that sent to [facility] in [place]. If this is not correct, he wants someone to call his wife, Carlyon Shadow at [phone] and let her know.

## 2020-12-14 NOTE — Telephone Encounter (Signed)
Spoke with wife, informed her that refills have been sent to the requested pharmacy. She verbalized understanding.

## 2021-01-11 ENCOUNTER — Encounter: Payer: Self-pay | Admitting: Physician Assistant

## 2021-01-11 ENCOUNTER — Ambulatory Visit (INDEPENDENT_AMBULATORY_CARE_PROVIDER_SITE_OTHER): Payer: Medicare Other | Admitting: Physician Assistant

## 2021-01-11 VITALS — BP 118/62 | HR 70 | Ht 63.0 in | Wt 183.0 lb

## 2021-01-11 DIAGNOSIS — R194 Change in bowel habit: Secondary | ICD-10-CM

## 2021-01-11 MED ORDER — DICYCLOMINE HCL 20 MG PO TABS: 20.0000 mg | ORAL_TABLET | Freq: Two times a day (BID) | ORAL | 11 refills | Status: DC

## 2021-01-11 NOTE — Progress Notes (Signed)
Chief Complaint: Change in bowel habits  HPI:    [name] is a 80 year old Caucasian male with a past medical history of CAD, COPD, diabetes and multiple others listed below, known to Dr. Carlean Purl, who presents to clinic today with a complaint of change in bowel habits.    12/14/2019 patient seen in clinic by Dr. Carlean Purl.  At that time was discussed he had a history of adenomatous colon polyps, reflux and Barrett's and had a colonoscopy in 2014 with a diminutive SSP in the adenoma, he was technically due for recall but given his overall situation with age, polyp history and other issues it was recommended that we hold colonoscopy for signs and symptoms and not do routine surveillance going forward.  Sowles discussed that he was still on dicyclomine which helped his IBS symptoms.    Today, the patient tells me that for the past month he has had a change in stools telling me that they radiate from soft to hard and are very messy and hard to clean.  They are 3-5 times a day.  Explains that prior to this he was having 1-2 a day which were soft solid normal stools.  Tells me he has not changed any medications or diet.  When asked about his Dicyclomine the patient is unsure if he is still on this, he called his wife who checks his medication supply at home and he is no longer taking the Dicyclomine which he was on twice daily for his IBS.  He is not sure when he stopped taking this medicine.    Patient does have some added stress at home with his wife having had 3 back surgeries in the past year and in chronic pain.    Denies fever, chills, weight loss or blood in his stool.  Past Medical History:  Diagnosis Date  . Adenomatous colon polyp   . Barrett esophagus   . CAD (coronary artery disease)   . Chronic headaches   . COPD (chronic obstructive pulmonary disease) ([place])   . Coronary atherosclerosis   . Diverticulitis of colon December 2012   Diagnosed by CT scan [facility]  . DM  (diabetes mellitus) ([place])   . GERD (gastroesophageal reflux disease)   . Hemorrhoids   . History of kidney stones   . HLD (hyperlipidemia)   . Horseshoe kidney   . HTN (hypertension)   . Melanoma ([place])   . Poor short term memory   . Tracheobronchomalacia     Past Surgical History:  Procedure Laterality Date  . CHOLECYSTECTOMY  01/12/2013   Dr. Gerlene Fee  . COLONOSCOPY W/ BIOPSIES AND POLYPECTOMY  09/08/2007   adenomatous polyps, diverticulosis, external hemorrhoids  . CORONARY ARTERY BYPASS GRAFT    . CORONARY STENT PLACEMENT     x 5  . TONSILLECTOMY    . UMBILICAL HERNIA REPAIR    . UPPER GASTROINTESTINAL ENDOSCOPY  10/12/2010   barrett's, fondic gland polyps, duodenitis    Current Outpatient Medications  Medication Sig Dispense Refill  . aspirin 325 MG EC tablet Take 325 mg by mouth at bedtime.     . chlorpheniramine (CHLOR-TRIMETON) 4 MG tablet Take 1 tablet (4 mg total) by mouth at bedtime. 30 tablet 5  . clopidogrel (PLAVIX) 75 MG tablet Take 75 mg by mouth at bedtime.    [name] doxycycline (VIBRA-TABS) 100 MG tablet Take 100 mg by mouth 2 (two) times daily.    [name] ezetimibe (ZETIA) 10 MG tablet Take 10 mg by  mouth every morning.    . famotidine (PEPCID) 20 MG tablet Take 20 mg by mouth daily.     [name] guaiFENesin (MUCINEX) 600 MG 12 hr tablet Take 1 tablet (600 mg total) by mouth 2 (two) times daily as needed. 60 tablet 5  . HYDROcodone-acetaminophen (NORCO) 7.5-325 MG tablet Take 1 tablet by mouth 2 (two) times daily as needed.    [name] ipratropium (ATROVENT) 0.06 % nasal spray Place 2 sprays into both nostrils 3 (three) times daily as needed for rhinitis (runny nose). 15 mL 5  . isosorbide mononitrate (IMDUR) 30 MG 24 hr tablet Take 30 mg by mouth at bedtime.     [name] losartan (COZAAR) 100 MG tablet Take 100 mg by mouth daily.    [name] MAGNESIUM PO Take 1 tablet by mouth daily.    . montelukast (SINGULAIR) 10 MG tablet Take 1 tablet (10 mg total) by mouth at bedtime. 30 tablet 11  .  mupirocin ointment (BACTROBAN) 2 % SMARTSIG:1 Application Topical 2-3 Times Daily    . pantoprazole (PROTONIX) 40 MG tablet Take 1 tablet (40 mg total) by mouth daily. 90 tablet   . Potassium 99 MG TABS Take 1 tablet by mouth daily.    [name] Respiratory Therapy Supplies (FLUTTER) DEVI by Does not apply route as directed.    . sertraline (ZOLOFT) 50 MG tablet Take 50 mg by mouth daily.    . vitamin C (ASCORBIC ACID) 500 MG tablet Take 500 mg by mouth daily.    [name] dicyclomine (BENTYL) 20 MG tablet Take 1 tablet (20 mg total) by mouth 2 (two) times daily. 60 tablet 11   No current facility-administered medications for this visit.    Allergies as of 01/11/2021 - Review Complete 01/11/2021  Allergen Reaction Noted  . Depakote [divalproex sodium]  12/28/2012  . Other Other (See Comments) 03/23/2013  . Propranolol Other (See Comments) 11/15/2016  . Statins Other (See Comments) 11/15/2016    Family History  Problem Relation Age of Onset  . Colitis Other        niece  . Diabetes Mother   . Heart disease Mother   . Heart disease Father   . Heart disease Brother   . Rheumatic fever Sister   . Colon cancer Neg Hx   . Stomach cancer Neg Hx   . Pancreatic cancer Neg Hx     Social History   Socioeconomic History  . Marital status: Married    Spouse name: Not on file  . Number of children: 3  . Years of education: Not on file  . Highest education level: Not on file  Occupational History  . Occupation: retired  Tobacco Use  . Smoking status: Former Smoker    Packs/day: 1.00    Years: 40.00    Pack years: 40.00    Types: Cigarettes    Quit date: 07/30/1976    Years since quitting: 44.4  . Smokeless tobacco: Never Used  Vaping Use  . Vaping Use: Never used  Substance and Sexual Activity  . Alcohol use: No  . Drug use: No  . Sexual activity: Not on file  Other Topics Concern  . Not on file  Social History Narrative   Married has 3 children he is retired   Former smoker, no alcohol  tobacco or drug use now   Social Determinants of Radio broadcast assistant Strain: Not on file  Food Insecurity: Not on file  Transportation Needs: Not on file  Physical Activity: Not  on file  Stress: Not on file  Social Connections: Not on file  Intimate Partner Violence: Not on file    Review of Systems:    Constitutional: No weight loss, fever or chills Cardiovascular: No chest pain   Respiratory: No SOB  Gastrointestinal: See HPI and otherwise negative   Physical Exam:  Vital signs: BP 118/62   Pulse 70   Ht 5\' 3"  (1.6 m)   Wt 183 lb (83 kg)   BMI 32.42 kg/m   Constitutional:   Pleasant elderly Caucasian male appears to be in NAD, Well developed, Well nourished, alert and cooperative Respiratory: Respirations even and unlabored. Lungs clear to auscultation bilaterally.   No wheezes, crackles, or rhonchi.  Cardiovascular: Normal S1, S2. No MRG. Regular rate and rhythm. No peripheral edema, cyanosis or pallor.  Gastrointestinal:  Soft, nondistended, nontender. No rebound or guarding. Normal bowel sounds. No appreciable masses or hepatomegaly. Rectal:  Not performed.  Psychiatric: Demonstrates good judgement and reason without abnormal affect or behaviors.  No recent labs.  Assessment: 1.  Change in bowel habits: Over the past month more frequent and change in consistency, does have a history of IBS with diarrhea controlled with Dicyclomine in the past which she has not been taking; most likely IBS  Plan: 1.  Refilled patient's Dicyclomine 20 mg twice daily #60 with 11 refills. 2.  Discussed with the patient that if after starting the medication above he does not return to more normal bowel habits then we may need to discuss a colonoscopy or other intervention.  He verbalized understanding. 3.  Patient was provided with a follow-up appointment with me in 2 months.  Ellouise Newer, PA-C [facility] 01/11/2021, 9:37 AM  Cc: Edmonia James, PA-C

## 2021-01-11 NOTE — Patient Instructions (Signed)
If you are age 79 or older, your body mass index should be between 23-30. Your Body mass index is 32.42 kg/m. If this is out of the aforementioned range listed, please consider follow up with your Primary Care Provider.  If you are age 92 or younger, your body mass index should be between 19-25. Your Body mass index is 32.42 kg/m. If this is out of the aformentioned range listed, please consider follow up with your Primary Care Provider.   We have sent the following medications to your pharmacy for you to pick up at your convenience: Dicyclomine   Thank you for choosing me and [facility].  Ellouise Newer, PA-C

## 2021-02-19 ENCOUNTER — Other Ambulatory Visit: Payer: Self-pay

## 2021-02-19 MED ORDER — DICYCLOMINE HCL 20 MG PO TABS: 20.0000 mg | ORAL_TABLET | Freq: Two times a day (BID) | ORAL | 11 refills | Status: DC

## 2021-02-20 ENCOUNTER — Other Ambulatory Visit: Payer: Self-pay

## 2021-02-20 MED ORDER — DICYCLOMINE HCL 20 MG PO TABS: 20.0000 mg | ORAL_TABLET | Freq: Two times a day (BID) | ORAL | 11 refills | Status: DC

## 2021-02-26 ENCOUNTER — Other Ambulatory Visit: Payer: Self-pay

## 2021-02-26 ENCOUNTER — Encounter: Payer: Self-pay | Admitting: Surgery

## 2021-02-26 ENCOUNTER — Ambulatory Visit (INDEPENDENT_AMBULATORY_CARE_PROVIDER_SITE_OTHER): Payer: Medicare Other | Admitting: Surgery

## 2021-02-26 VITALS — BP 145/82 | HR 70 | Temp 97.9°F | Resp 20 | Ht 63.0 in | Wt 184.0 lb

## 2021-02-26 DIAGNOSIS — I6523 Occlusion and stenosis of bilateral carotid arteries: Secondary | ICD-10-CM | POA: Diagnosis not present

## 2021-02-26 NOTE — H&P (View-Only) (Signed)
V[facility] and [facility]in Specialist of [place]  Patient name: Angel Khan MRN: [id number] DOB: [birth year] Sex: male   REQUESTING PROVIDER:    Pablo Lawrence    REASON FOR CONSULT:    Carotid stenosis  HISTORY OF PRESENT ILLNESS:   Angel Khan is a 80 y.o. 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  The stenosis measured approximately 80% and was associated with an ulcer.  The patient is asymptomatic.  Specifically, he denies numbness or weakness in either extremity.  He denies slurred speech.  He denies amaurosis fugax.  The patient has a history of coronary artery disease, status post PCI and CABG.  He is a type II diabetic.  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  PAST MEDICAL HISTORY    Past Medical History:  Diagnosis Date  . Adenomatous colon polyp   . Barrett esophagus   . CAD (coronary artery disease)   . Chronic headaches   . COPD (chronic obstructive pulmonary disease) ([place])   . Coronary atherosclerosis   . Diverticulitis of colon December 2012   Diagnosed by CT scan [facility]  . DM (diabetes mellitus) ([place])   . GERD (gastroesophageal reflux disease)   . Hemorrhoids   . History of kidney stones   . HLD (hyperlipidemia)   . Horseshoe kidney   . HTN (hypertension)   . Melanoma ([place])   . Poor short term memory   . Tracheobronchomalacia      FAMILY HISTORY   Family History  Problem Relation Age of Onset  . Colitis Other        niece  . Diabetes Mother   . Heart disease Mother   . Heart disease Father   . Heart disease Brother   . Rheumatic fever Sister   . Colon cancer Neg Hx   . Stomach cancer Neg Hx   . Pancreatic cancer Neg Hx     SOCIAL HISTORY:   Social History    Socioeconomic History  . Marital status: Married    Spouse name: Not on file  . Number of children: 3  . Years of education: Not on file  . Highest education level: Not on file  Occupational History  . Occupation: retired  Tobacco Use  . Smoking status: Former Smoker    Packs/day: 1.00    Years: 40.00    Pack years: 40.00    Types: Cigarettes    Quit date: 07/30/1976    Years since quitting: 44.6  . Smokeless tobacco: Never Used  Vaping Use  . Vaping Use: Never used  Substance and Sexual Activity  . Alcohol use: No  . Drug use: No  . Sexual activity: Not on file  Other Topics Concern  . Not on file  Social History Narrative   Married has 3 children he is retired   Former smoker, no alcohol tobacco or drug use now   Investment banker, operational of Radio broadcast assistant Strain: Not on [employer] Insecurity: Not on file  Transportation Needs: Not on file  Physical Activity: Not on file  Stress: Not on file  Social Connections: Not on file  Intimate Partner Violence: Not on file    ALLERGIES:    Allergies  Allergen Reactions  . Depakote [Divalproex Sodium]     Hair  loss   . Magnesium   . Other Other (See Comments)    Hair loss, rash Liquid Magnesium  . Propranolol Other (See Comments)    Muscle cramps  . Statins Other (See Comments)    Muscle cramps    CURRENT MEDICATIONS:    Current Outpatient Medications  Medication Sig Dispense Refill  . doxycycline (MONODOX) 100 MG capsule Take by mouth.    [name] aspirin 325 MG EC tablet Take 325 mg by mouth at bedtime.     . chlorpheniramine (CHLOR-TRIMETON) 4 MG tablet Take 1 tablet (4 mg total) by mouth at bedtime. 30 tablet 5  . clopidogrel (PLAVIX) 75 MG tablet Take 75 mg by mouth at bedtime.    . dicyclomine (BENTYL) 20 MG tablet Take 1 tablet (20 mg total) by mouth 2 (two) times daily. 60 tablet 11  . ezetimibe (ZETIA) 10 MG tablet Take 10 mg by mouth every morning.    . famotidine (PEPCID) 20 MG tablet Take 20  mg by mouth daily.     [name] guaiFENesin (MUCINEX) 600 MG 12 hr tablet Take 1 tablet (600 mg total) by mouth 2 (two) times daily as needed. 60 tablet 5  . HYDROcodone-acetaminophen (NORCO) 7.5-325 MG tablet Take 1 tablet by mouth 2 (two) times daily as needed.    [name] ipratropium (ATROVENT) 0.06 % nasal spray Place 2 sprays into both nostrils 3 (three) times daily as needed for rhinitis (runny nose). 15 mL 5  . isosorbide mononitrate (IMDUR) 30 MG 24 hr tablet Take 30 mg by mouth at bedtime.     [name] losartan (COZAAR) 100 MG tablet Take 100 mg by mouth daily.    [name] MAGNESIUM PO Take 1 tablet by mouth daily.    . montelukast (SINGULAIR) 10 MG tablet Take 1 tablet (10 mg total) by mouth at bedtime. 30 tablet 11  . mupirocin ointment (BACTROBAN) 2 % SMARTSIG:1 Application Topical 2-3 Times Daily    . pantoprazole (PROTONIX) 40 MG tablet Take 1 tablet (40 mg total) by mouth daily. 90 tablet   . Potassium 99 MG TABS Take 1 tablet by mouth daily.    [name] Respiratory Therapy Supplies (FLUTTER) DEVI by Does not apply route as directed.    . sertraline (ZOLOFT) 50 MG tablet Take 50 mg by mouth daily.    . vitamin C (ASCORBIC ACID) 500 MG tablet Take 500 mg by mouth daily.     No current facility-administered medications for this visit.    REVIEW OF SYSTEMS:   [X]  denotes positive finding, [ ]  denotes negative finding Cardiac  Comments:  Chest pain or chest pressure:    Shortness of breath upon exertion:    Short of breath when lying flat:    Irregular heart rhythm:        Vascular    Pain in calf, thigh, or hip brought on by ambulation:    Pain in feet at night that wakes you up from your sleep:     Blood clot in your veins:    Leg swelling:         Pulmonary    Oxygen at home:    Productive cough:     Wheezing:         Neurologic    Sudden weakness in arms or legs:     Sudden numbness in arms or legs:     Sudden onset of difficulty speaking or slurred speech:    Temporary loss of vision in one  eye:  Problems with dizziness:         Gastrointestinal    Blood in stool:      Vomited blood:         Genitourinary    Burning when urinating:     Blood in urine:        Psychiatric    Major depression:         Hematologic    Bleeding problems:    Problems with blood clotting too easily:        Skin    Rashes or ulcers:        Constitutional    Fever or chills:     PHYSICAL EXAM:   There were no vitals filed for this visit.  GENERAL: The patient is a well-nourished male, in no acute distress. The vital signs are documented above. CARDIAC: There is a regular rate and rhythm.  VASCULAR: Palpable right dorsalis pedis pulse, nonpalpable left PULMONARY: Nonlabored respirations ABDOMEN: Soft and non-tender with normal pitched bowel sounds.  MUSCULOSKELETAL: There are no major deformities or cyanosis. NEUROLOGIC: No focal weakness or paresthesias are detected. SKIN: There are no ulcers or rashes noted. PSYCHIATRIC: The patient has a normal affect.  STUDIES:   I have reviewed the carotid CT scan from [facility].  This shows a less than 50% right carotid stenosis and an 80% left internal carotid stenosis with soft plaque and ulceration.  ASSESSMENT and PLAN   Asymptomatic left carotid stenosis: After reviewing his CT scan, he has soft plaque and ulceration at his carotid bifurcation with approximately 80% stenosis.  I have recommended proceeding with left carotid endarterectomy for stroke prevention.  I discussed the details of the operation as well as the risks and benefits including but not limited to the risk of stroke, nerve injury, cardiopulmonary complications, and bleeding, as well as numbness to the incision area and face.  All of his questions were answered.  He would like to proceed.  I will stop the patient's Plavix 5 days prior to surgery.  I will reach out to his cardiologist, Dr. Prince Rome to make sure he is cleared from a cardiac perspective to proceed.  In  addition, he will need to be started on statin therapy while in the hospital.  He does not think he is taking a statin but will bring his medications to the hospital to make sure that he is not on 1.   Leia Alf, MD, FACS [facility] and Vein Specialists of [facility] [phone] Pager [phone]

## 2021-02-26 NOTE — Progress Notes (Signed)
Vascular and Vein Specialist of [place]  Patient name: Angel Khan MRN: [id number] DOB: [birth year] Sex: male   REQUESTING PROVIDER:    Pablo Lawrence    REASON FOR CONSULT:    Carotid stenosis  HISTORY OF PRESENT ILLNESS:   Angel Khan is a 80 y.o. 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  The stenosis measured approximately 80% and was associated with an ulcer.  The patient is asymptomatic.  Specifically, he denies numbness or weakness in either extremity.  He denies slurred speech.  He denies amaurosis fugax.  The patient has a history of coronary artery disease, status post PCI and CABG.  He is a type II diabetic.  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  PAST MEDICAL HISTORY    Past Medical History:  Diagnosis Date  . Adenomatous colon polyp   . Barrett esophagus   . CAD (coronary artery disease)   . Chronic headaches   . COPD (chronic obstructive pulmonary disease) ([place])   . Coronary atherosclerosis   . Diverticulitis of colon December 2012   Diagnosed by CT scan [facility]  . DM (diabetes mellitus) ([place])   . GERD (gastroesophageal reflux disease)   . Hemorrhoids   . History of kidney stones   . HLD (hyperlipidemia)   . Horseshoe kidney   . HTN (hypertension)   . Melanoma ([place])   . Poor short term memory   . Tracheobronchomalacia      FAMILY HISTORY   Family History  Problem Relation Age of Onset  . Colitis Other        niece  . Diabetes Mother   . Heart disease Mother   . Heart disease Father   . Heart disease Brother   . Rheumatic fever Sister   . Colon cancer Neg Hx   . Stomach cancer Neg Hx   . Pancreatic cancer Neg Hx     SOCIAL HISTORY:   Social History    Socioeconomic History  . Marital status: Married    Spouse name: Not on file  . Number of children: 3  . Years of education: Not on file  . Highest education level: Not on file  Occupational History  . Occupation: retired  Tobacco Use  . Smoking status: Former Smoker    Packs/day: 1.00    Years: 40.00    Pack years: 40.00    Types: Cigarettes    Quit date: 07/30/1976    Years since quitting: 44.6  . Smokeless tobacco: Never Used  Vaping Use  . Vaping Use: Never used  Substance and Sexual Activity  . Alcohol use: No  . Drug use: No  . Sexual activity: Not on file  Other Topics Concern  . Not on file  Social History Narrative   Married has 3 children he is retired   Former smoker, no alcohol tobacco or drug use now   Investment banker, operational of Radio broadcast assistant Strain: Not on [employer] Insecurity: Not on file  Transportation Needs: Not on file  Physical Activity: Not on file  Stress: Not on file  Social Connections: Not on file  Intimate Partner Violence: Not on file    ALLERGIES:    Allergies  Allergen Reactions  . Depakote [Divalproex Sodium]     Hair  loss   . Magnesium   . Other Other (See Comments)    Hair loss, rash Liquid Magnesium  . Propranolol Other (See Comments)    Muscle cramps  . Statins Other (See Comments)    Muscle cramps    CURRENT MEDICATIONS:    Current Outpatient Medications  Medication Sig Dispense Refill  . doxycycline (MONODOX) 100 MG capsule Take by mouth.    [name] aspirin 325 MG EC tablet Take 325 mg by mouth at bedtime.     . chlorpheniramine (CHLOR-TRIMETON) 4 MG tablet Take 1 tablet (4 mg total) by mouth at bedtime. 30 tablet 5  . clopidogrel (PLAVIX) 75 MG tablet Take 75 mg by mouth at bedtime.    . dicyclomine (BENTYL) 20 MG tablet Take 1 tablet (20 mg total) by mouth 2 (two) times daily. 60 tablet 11  . ezetimibe (ZETIA) 10 MG tablet Take 10 mg by mouth every morning.    . famotidine (PEPCID) 20 MG tablet Take 20  mg by mouth daily.     [name] guaiFENesin (MUCINEX) 600 MG 12 hr tablet Take 1 tablet (600 mg total) by mouth 2 (two) times daily as needed. 60 tablet 5  . HYDROcodone-acetaminophen (NORCO) 7.5-325 MG tablet Take 1 tablet by mouth 2 (two) times daily as needed.    [name] ipratropium (ATROVENT) 0.06 % nasal spray Place 2 sprays into both nostrils 3 (three) times daily as needed for rhinitis (runny nose). 15 mL 5  . isosorbide mononitrate (IMDUR) 30 MG 24 hr tablet Take 30 mg by mouth at bedtime.     [name] losartan (COZAAR) 100 MG tablet Take 100 mg by mouth daily.    [name] MAGNESIUM PO Take 1 tablet by mouth daily.    . montelukast (SINGULAIR) 10 MG tablet Take 1 tablet (10 mg total) by mouth at bedtime. 30 tablet 11  . mupirocin ointment (BACTROBAN) 2 % SMARTSIG:1 Application Topical 2-3 Times Daily    . pantoprazole (PROTONIX) 40 MG tablet Take 1 tablet (40 mg total) by mouth daily. 90 tablet   . Potassium 99 MG TABS Take 1 tablet by mouth daily.    [name] Respiratory Therapy Supplies (FLUTTER) DEVI by Does not apply route as directed.    . sertraline (ZOLOFT) 50 MG tablet Take 50 mg by mouth daily.    . vitamin C (ASCORBIC ACID) 500 MG tablet Take 500 mg by mouth daily.     No current facility-administered medications for this visit.    REVIEW OF SYSTEMS:   [X]  denotes positive finding, [ ]  denotes negative finding Cardiac  Comments:  Chest pain or chest pressure:    Shortness of breath upon exertion:    Short of breath when lying flat:    Irregular heart rhythm:        Vascular    Pain in calf, thigh, or hip brought on by ambulation:    Pain in feet at night that wakes you up from your sleep:     Blood clot in your veins:    Leg swelling:         Pulmonary    Oxygen at home:    Productive cough:     Wheezing:         Neurologic    Sudden weakness in arms or legs:     Sudden numbness in arms or legs:     Sudden onset of difficulty speaking or slurred speech:    Temporary loss of vision in one  eye:  Problems with dizziness:         Gastrointestinal    Blood in stool:      Vomited blood:         Genitourinary    Burning when urinating:     Blood in urine:        Psychiatric    Major depression:         Hematologic    Bleeding problems:    Problems with blood clotting too easily:        Skin    Rashes or ulcers:        Constitutional    Fever or chills:     PHYSICAL EXAM:   There were no vitals filed for this visit.  GENERAL: The patient is a well-nourished male, in no acute distress. The vital signs are documented above. CARDIAC: There is a regular rate and rhythm.  VASCULAR: Palpable right dorsalis pedis pulse, nonpalpable left PULMONARY: Nonlabored respirations ABDOMEN: Soft and non-tender with normal pitched bowel sounds.  MUSCULOSKELETAL: There are no major deformities or cyanosis. NEUROLOGIC: No focal weakness or paresthesias are detected. SKIN: There are no ulcers or rashes noted. PSYCHIATRIC: The patient has a normal affect.  STUDIES:   I have reviewed the carotid CT scan from [facility].  This shows a less than 50% right carotid stenosis and an 80% left internal carotid stenosis with soft plaque and ulceration.  ASSESSMENT and PLAN   Asymptomatic left carotid stenosis: After reviewing his CT scan, he has soft plaque and ulceration at his carotid bifurcation with approximately 80% stenosis.  I have recommended proceeding with left carotid endarterectomy for stroke prevention.  I discussed the details of the operation as well as the risks and benefits including but not limited to the risk of stroke, nerve injury, cardiopulmonary complications, and bleeding, as well as numbness to the incision area and face.  All of his questions were answered.  He would like to proceed.  I will stop the patient's Plavix 5 days prior to surgery.  I will reach out to his cardiologist, Dr. Prince Rome to make sure he is cleared from a cardiac perspective to proceed.  In  addition, he will need to be started on statin therapy while in the hospital.  He does not think he is taking a statin but will bring his medications to the hospital to make sure that he is not on 1.   Leia Alf, MD, FACS Vascular and Vein Specialists of [facility] [phone] Pager [phone]

## 2021-03-07 NOTE — Pre-Procedure Instructions (Incomplete Revision)
Angel Khan  03/07/2021    Your procedure is scheduled on Wed. March 14, 2021 from 8:30AM-10:55AM  Report to [facility] Entrance "A" at 6:30AM   Call this number if you have problems the morning of surgery:  [phone]   Remember:  Do not eat or drink after midnight on March 15th    Take these medicines the morning of surgery with A SIP OF WATER: CloNIDine (CATAPRES) Dicyclomine (BENTYL) Ezetimibe (ZETIA) Famotidine (PEPCID)  Pantoprazole (PROTONIX) Sertraline (ZOLOFT)   If Needed: Ipratropium (ATROVENT) nasal spray HYDROcodone-acetaminophen (NORCO)  Take last dose of Plavix on 03/09/21, per Dr. Trula Slade  As of today, STOP taking all Aspirin (unless instructed by your doctor) and Other Aspirin containing products, Vitamins, Fish oils, and Herbal medications. Also stop all NSAIDS i.e. Advil, Ibuprofen, Motrin, Aleve, Anaprox, Naproxen, BC, Goody Powders, and all Supplements.   How do I manage my blood sugar before surgery? . Check your blood sugar at least 4 times a day, starting 2 days before surgery, to make sure that the level is not too high or low. o Check your blood sugar the morning of your surgery when you wake up and every 2 hours until you get to the Short Stay unit. . If your blood sugar is less than 70 mg/dL, you will need to treat for low blood sugar: o Do not take insulin. o Treat a low blood sugar (less than 70 mg/dL) with  cup of clear juice (cranberry or apple), 4 glucose tablets, OR glucose gel. Recheck blood sugar in 15 minutes after treatment (to make sure it is greater than 70 mg/dL). If your blood sugar is not greater than 70 mg/dL on recheck, call [phone] o  for further instructions.   . If your CBG is greater than 220 mg/dL, inform the staff upon arrival to Short Stay.  . If you are admitted to the hospital after surgery: o Your blood sugar will be checked by the staff and you will probably be given insulin after surgery (instead  of oral diabetes medicines) to make sure you have good blood sugar levels. o The goal for blood sugar control after surgery is 80-180 mg/dL.  Reviewed and Endorsed by [facility] Patient Education Committee, August 2015   No Smoking of any kind, Tobacco/Vaping, or Alcohol products 24 hours prior to your procedure. If you use a Cpap at night, you may bring all equipment for your overnight stay.    Day of Surgery:  Do not wear jewelry.  Do not wear lotions, powders, colognes, or deodorant.  Do not shave 48 hours prior to surgery.  Men may shave face and neck.  Do not bring valuables to the hospital.  [facility] is not responsible for any belongings or valuables.  Contacts, dentures or bridgework may not be worn into surgery.   For patients admitted to the hospital, discharge time will be determined by your treatment team.  Patients discharged the day of surgery will not be allowed to drive home, and someone age 71 and over needs to stay with them for 24 hours.   Special instructions:   [place]- Preparing For Surgery  Before surgery, you can play an important role. Because skin is not sterile, your skin needs to be as free of germs as possible. You can reduce the number of germs on your skin by washing with CHG (chlorahexidine gluconate) Soap before surgery.  CHG is an antiseptic cleaner which kills germs and bonds with the  skin to continue killing germs even after washing.    Oral Hygiene is also important to reduce your risk of infection.  Remember - BRUSH YOUR TEETH THE MORNING OF SURGERY WITH YOUR REGULAR TOOTHPASTE  Please do not use if you have an allergy to CHG or antibacterial soaps. If your skin becomes reddened/irritated stop using the CHG.  Do not shave (including legs and underarms) for at least 48 hours prior to first CHG shower. It is OK to shave your face.  Please follow these instructions carefully.   1. Shower the NIGHT BEFORE SURGERY and the MORNING OF SURGERY  with CHG.   2. If you chose to wash your hair, wash your hair first as usual with your normal shampoo.  3. After you shampoo, rinse your hair and body thoroughly to remove the shampoo.  4. Use CHG as you would any other liquid soap. You can apply CHG directly to the skin and wash gently with a scrungie or a clean washcloth.   5. Apply the CHG Soap to your body ONLY FROM THE NECK DOWN.  Do not use on open wounds or open sores. Avoid contact with your eyes, ears, mouth and genitals (private parts). Wash Face and genitals (private parts)  with your normal soap.  6. Wash thoroughly, paying special attention to the area where your surgery will be performed.  7. Thoroughly rinse your body with warm water from the neck down.  8. DO NOT shower/wash with your normal soap after using and rinsing off the CHG Soap.  9. Pat yourself dry with a CLEAN TOWEL.  10. Wear CLEAN PAJAMAS to bed the night before surgery, wear comfortable clothes the morning of surgery  11. Place CLEAN SHEETS on your bed the night of your first shower and DO NOT SLEEP WITH PETS.  Reminders: Do not apply any deodorants/lotions.  Please wear clean clothes to the hospital/surgery center.   Remember to brush your teeth WITH YOUR REGULAR TOOTHPASTE.  Please read over the following fact sheets that you were given.

## 2021-03-07 NOTE — Pre-Procedure Instructions (Addendum)
Angel Khan  03/07/2021    Your procedure is scheduled on Wed. March 14, 2021 from 8:30AM-10:55AM  Report to [facility] Entrance "A" at 6:30AM   Call this number if you have problems the morning of surgery:  [phone]   Remember:  Do not eat or drink after midnight on March 15th    Take these medicines the morning of surgery with A SIP OF WATER: CloNIDine (CATAPRES) Dicyclomine (BENTYL) Ezetimibe (ZETIA) Famotidine (PEPCID)  Pantoprazole (PROTONIX) Sertraline (ZOLOFT)   If Needed: Ipratropium (ATROVENT) nasal spray HYDROcodone-acetaminophen (NORCO)  Take last dose of Plavix on 03/09/21, per Dr. Trula Slade  As of today, STOP taking all Aspirin (unless instructed by your doctor) and Other Aspirin containing products, Vitamins, Fish oils, and Herbal medications. Also stop all NSAIDS i.e. Advil, Ibuprofen, Motrin, Aleve, Anaprox, Naproxen, BC, Goody Powders, and all Supplements.   How do I manage my blood sugar before surgery? . Check your blood sugar at least 4 times a day, starting 2 days before surgery, to make sure that the level is not too high or low. o Check your blood sugar the morning of your surgery when you wake up and every 2 hours until you get to the Short Stay unit. . If your blood sugar is less than 70 mg/dL, you will need to treat for low blood sugar: o Do not take insulin. o Treat a low blood sugar (less than 70 mg/dL) with  cup of clear juice (cranberry or apple), 4 glucose tablets, OR glucose gel. Recheck blood sugar in 15 minutes after treatment (to make sure it is greater than 70 mg/dL). If your blood sugar is not greater than 70 mg/dL on recheck, call [phone] o  for further instructions.   . If your CBG is greater than 220 mg/dL, inform the staff upon arrival to Short Stay.  . If you are admitted to the hospital after surgery: o Your blood sugar will be checked by the staff and you will probably be given insulin after surgery (instead  of oral diabetes medicines) to make sure you have good blood sugar levels. o The goal for blood sugar control after surgery is 80-180 mg/dL.  Reviewed and Endorsed by [facility] Patient Education Committee, August 2015   No Smoking of any kind, Tobacco/Vaping, or Alcohol products 24 hours prior to your procedure. If you use a Cpap at night, you may bring all equipment for your overnight stay.    Day of Surgery:  Do not wear jewelry.  Do not wear lotions, powders, colognes, or deodorant.  Do not shave 48 hours prior to surgery.  Men may shave face and neck.  Do not bring valuables to the hospital.  [facility] is not responsible for any belongings or valuables.  Contacts, dentures or bridgework may not be worn into surgery.   For patients admitted to the hospital, discharge time will be determined by your treatment team.  Patients discharged the day of surgery will not be allowed to drive home, and someone age 79 and over needs to stay with them for 24 hours.   Special instructions:   [facility]- Preparing For Surgery  Before surgery, you can play an important role. Because skin is not sterile, your skin needs to be as free of germs as possible. You can reduce the number of germs on your skin by washing with CHG (chlorahexidine gluconate) Soap before surgery.  CHG is an antiseptic cleaner which kills germs and bonds with the  skin to continue killing germs even after washing.    Oral Hygiene is also important to reduce your risk of infection.  Remember - BRUSH YOUR TEETH THE MORNING OF SURGERY WITH YOUR REGULAR TOOTHPASTE  Please do not use if you have an allergy to CHG or antibacterial soaps. If your skin becomes reddened/irritated stop using the CHG.  Do not shave (including legs and underarms) for at least 48 hours prior to first CHG shower. It is OK to shave your face.  Please follow these instructions carefully.   1. Shower the NIGHT BEFORE SURGERY and the MORNING OF SURGERY  with CHG.   2. If you chose to wash your hair, wash your hair first as usual with your normal shampoo.  3. After you shampoo, rinse your hair and body thoroughly to remove the shampoo.  4. Use CHG as you would any other liquid soap. You can apply CHG directly to the skin and wash gently with a scrungie or a clean washcloth.   5. Apply the CHG Soap to your body ONLY FROM THE NECK DOWN.  Do not use on open wounds or open sores. Avoid contact with your eyes, ears, mouth and genitals (private parts). Wash Face and genitals (private parts)  with your normal soap.  6. Wash thoroughly, paying special attention to the area where your surgery will be performed.  7. Thoroughly rinse your body with warm water from the neck down.  8. DO NOT shower/wash with your normal soap after using and rinsing off the CHG Soap.  9. Pat yourself dry with a CLEAN TOWEL.  10. Wear CLEAN PAJAMAS to bed the night before surgery, wear comfortable clothes the morning of surgery  11. Place CLEAN SHEETS on your bed the night of your first shower and DO NOT SLEEP WITH PETS.  Reminders: Do not apply any deodorants/lotions.  Please wear clean clothes to the hospital/surgery center.   Remember to brush your teeth WITH YOUR REGULAR TOOTHPASTE.  Please read over the following fact sheets that you were given.

## 2021-03-08 ENCOUNTER — Other Ambulatory Visit: Payer: Self-pay

## 2021-03-08 ENCOUNTER — Encounter (HOSPITAL_COMMUNITY): Payer: Self-pay

## 2021-03-08 ENCOUNTER — Encounter (HOSPITAL_COMMUNITY)
Admission: RE | Admit: 2021-03-08 | Discharge: 2021-03-08 | Disposition: A | Discharge status: Home / Self Care | Payer: Medicare Other | Source: Ambulatory Visit | Attending: Surgery | Admitting: Surgery

## 2021-03-08 DIAGNOSIS — E785 Hyperlipidemia, unspecified: Secondary | ICD-10-CM | POA: Diagnosis not present

## 2021-03-08 DIAGNOSIS — Z955 Presence of coronary angioplasty implant and graft: Secondary | ICD-10-CM | POA: Diagnosis not present

## 2021-03-08 DIAGNOSIS — Z951 Presence of aortocoronary bypass graft: Secondary | ICD-10-CM | POA: Insufficient documentation

## 2021-03-08 DIAGNOSIS — I252 Old myocardial infarction: Secondary | ICD-10-CM | POA: Diagnosis not present

## 2021-03-08 DIAGNOSIS — G4733 Obstructive sleep apnea (adult) (pediatric): Secondary | ICD-10-CM | POA: Diagnosis not present

## 2021-03-08 DIAGNOSIS — J449 Chronic obstructive pulmonary disease, unspecified: Secondary | ICD-10-CM | POA: Insufficient documentation

## 2021-03-08 DIAGNOSIS — R030 Elevated blood-pressure reading, without diagnosis of hypertension: Secondary | ICD-10-CM | POA: Diagnosis not present

## 2021-03-08 DIAGNOSIS — Z8673 Personal history of transient ischemic attack (TIA), and cerebral infarction without residual deficits: Secondary | ICD-10-CM | POA: Insufficient documentation

## 2021-03-08 DIAGNOSIS — Z7902 Long term (current) use of antithrombotics/antiplatelets: Secondary | ICD-10-CM | POA: Diagnosis not present

## 2021-03-08 DIAGNOSIS — Q631 Lobulated, fused and horseshoe kidney: Secondary | ICD-10-CM | POA: Insufficient documentation

## 2021-03-08 DIAGNOSIS — Z7982 Long term (current) use of aspirin: Secondary | ICD-10-CM | POA: Diagnosis not present

## 2021-03-08 DIAGNOSIS — Z8616 Personal history of COVID-19: Secondary | ICD-10-CM | POA: Diagnosis not present

## 2021-03-08 DIAGNOSIS — K219 Gastro-esophageal reflux disease without esophagitis: Secondary | ICD-10-CM | POA: Diagnosis not present

## 2021-03-08 DIAGNOSIS — I6529 Occlusion and stenosis of unspecified carotid artery: Secondary | ICD-10-CM | POA: Insufficient documentation

## 2021-03-08 DIAGNOSIS — I10 Essential (primary) hypertension: Secondary | ICD-10-CM | POA: Diagnosis not present

## 2021-03-08 DIAGNOSIS — Z01818 Encounter for other preprocedural examination: Secondary | ICD-10-CM | POA: Insufficient documentation

## 2021-03-08 DIAGNOSIS — Z87891 Personal history of nicotine dependence: Secondary | ICD-10-CM | POA: Insufficient documentation

## 2021-03-08 DIAGNOSIS — Z79899 Other long term (current) drug therapy: Secondary | ICD-10-CM | POA: Insufficient documentation

## 2021-03-08 DIAGNOSIS — I2510 Atherosclerotic heart disease of native coronary artery without angina pectoris: Secondary | ICD-10-CM | POA: Insufficient documentation

## 2021-03-08 DIAGNOSIS — R519 Headache, unspecified: Secondary | ICD-10-CM | POA: Insufficient documentation

## 2021-03-08 HISTORY — DX: Sleep apnea, unspecified: G47.30

## 2021-03-08 HISTORY — DX: Other complications of anesthesia, initial encounter: T88.59XA

## 2021-03-08 HISTORY — DX: Dyspnea, unspecified: R06.00

## 2021-03-08 LAB — COMPREHENSIVE METABOLIC PANEL
ALT: 14 U/L (ref 0–44)
AST: 18 U/L (ref 15–41)
Albumin: 3.9 g/dL (ref 3.5–5.0)
Alkaline Phosphatase: 61 U/L (ref 38–126)
Anion gap: 9 (ref 5–15)
BUN: 21 mg/dL (ref 8–23)
CO2: 23 mmol/L (ref 22–32)
Calcium: 9.5 mg/dL (ref 8.9–10.3)
Chloride: 106 mmol/L (ref 98–111)
Creatinine, Ser: 1.04 mg/dL (ref 0.61–1.24)
GFR, Estimated: >60 mL/min (ref >60)
Glucose, Bld: 98 mg/dL (ref 70–99)
Potassium: 4.4 mmol/L (ref 3.5–5.1)
Sodium: 138 mmol/L (ref 135–145)
Total Bilirubin: 0.7 mg/dL (ref 0.3–1.2)
Total Protein: 6.4 g/dL — ABNORMAL LOW (ref 6.5–8.1)

## 2021-03-08 LAB — URINALYSIS, ROUTINE W REFLEX MICROSCOPIC
Bilirubin Urine: NEGATIVE
Glucose, UA: NEGATIVE mg/dL
Hgb urine dipstick: NEGATIVE
Ketones, ur: NEGATIVE mg/dL
Leukocytes,Ua: NEGATIVE
Nitrite: NEGATIVE
Protein, ur: NEGATIVE mg/dL
Specific Gravity, Urine: 1.017 (ref 1.005–1.030)
pH: 5 (ref 5.0–8.0)

## 2021-03-08 LAB — TYPE AND SCREEN
ABO/RH(D): O POS
Antibody Screen: NEGATIVE

## 2021-03-08 LAB — CBC
HCT: 46.8 % (ref 39.0–52.0)
Hemoglobin: 15.2 g/dL (ref 13.0–17.0)
MCH: 31 pg (ref 26.0–34.0)
MCHC: 32.5 g/dL (ref 30.0–36.0)
MCV: 95.5 fL (ref 80.0–100.0)
Platelets: 303 10*3/uL (ref 150–400)
RBC: 4.9 MIL/uL (ref 4.22–5.81)
RDW: 14.3 % (ref 11.5–15.5)
WBC: 8.3 10*3/uL (ref 4.0–10.5)
nRBC: 0 % (ref 0.0–0.2)

## 2021-03-08 LAB — BLOOD GAS, ARTERIAL
Acid-Base Excess: 2 mmol/L (ref 0.0–2.0)
Bicarbonate: 26 mmol/L (ref 20.0–28.0)
Drawn by: 602861
FIO2: 21
O2 Saturation: 97.6 %
Patient temperature: 37
pCO2 arterial: 40.4 mmHg (ref 32.0–48.0)
pH, Arterial: 7.424 (ref 7.350–7.450)
pO2, Arterial: 96.9 mmHg (ref 83.0–108.0)

## 2021-03-08 LAB — SURGICAL PCR SCREEN
MRSA, PCR: NEGATIVE
Staphylococcus aureus: POSITIVE — AB

## 2021-03-08 LAB — GLUCOSE, CAPILLARY: Glucose-Capillary: 114 mg/dL — ABNORMAL HIGH (ref 70–99)

## 2021-03-08 LAB — HEMOGLOBIN A1C
Hgb A1c MFr Bld: 6.8 % — ABNORMAL HIGH (ref 4.8–5.6)
Mean Plasma Glucose: 148.46 mg/dL

## 2021-03-08 LAB — PROTIME-INR
INR: 1 (ref 0.8–1.2)
Prothrombin Time: 12.8 seconds (ref 11.4–15.2)

## 2021-03-08 LAB — APTT: aPTT: 35 seconds (ref 24–36)

## 2021-03-08 NOTE — Progress Notes (Addendum)
PCP - NP Pablo Lawrence- [facility]  Cardiologist - Dr. Valetta Fuller- [facility]  Chest x-ray - Denies  EKG - 03/08/21  Stress Test - Denies  ECHO - Denies  Cardiac Cath - 01/01/13 (E)  AICD-na PM-na LOOP-na  Sleep Study - Tes- Positive CPAP - Denies- Pt can not tolerate due to chronic neck pain  LABS- 01/16/21 ([facility]): COVID +- no retest for 90 days.- Result found by PA Allison on 03/09/21 03/08/21: CBC, BMP, PT, PTT, ABG, T/S, UA, PCR   ASA- Cont. Plavix- LD- 3/11  ERAS- No  HA1C- 03/08/21 Fasting Blood Sugar - 114 Checks Blood Sugar __0___ times a day- Pt states he does not have DM II and he does not check his bs at home, but it is in his past medical history.   Pt also states he is always short of breath, even though he denies having COPD, but this is also in his past medical history and in NP Tammy Parrett's progress note from 04/04/20 (E).   Anesthesia- Yes- medical/cardiac history  Pt denies having chest pain or fever at this time. All instructions explained to the pt, with a verbal understanding of the material. Pt agrees to go over the instructions while at home for a better understanding. Pt also instructed to self quarantine after being tested for COVID-19. The opportunity to ask questions was provided.   Coronavirus Screening  Have you experienced the following symptoms:  Cough yes/no: No Fever (>100.47F)  yes/no: No Runny nose yes/no: No Sore throat yes/no: No Difficulty breathing/shortness of breath  yes/no: No  Have you or a family member traveled in the last 14 days and where? yes/no: No   If the patient indicates "YES" to the above questions, their PAT will be rescheduled to limit the exposure to others and, the surgeon will be notified. THE PATIENT WILL NEED TO BE ASYMPTOMATIC FOR 14 DAYS.   If the patient is not experiencing any of these symptoms, the PAT nurse will instruct them to NOT bring anyone with them to their appointment since they may have these symptoms  or traveled as well.   Please remind your patients and families that hospital visitation restrictions are in effect and the importance of the restrictions.

## 2021-03-09 ENCOUNTER — Encounter (HOSPITAL_COMMUNITY): Payer: Self-pay

## 2021-03-09 NOTE — Progress Notes (Addendum)
Anesthesia Chart Review:  Case: [id number] Date/Time: 03/14/21 0815   Procedure: LEFT CAROTID ARTERY ENDARTERECTOMY (Left )   Anesthesia type: General   Pre-op diagnosis: Carotid Artery Stenosis   Location: [facility] OR ROOM 11 / [facility]   Surgeons: Serafina Mitchell, MD      DISCUSSION: Patient is a 80 year old male scheduled for the above procedure. He was referred to vascular surgery by his PCP after 02/20/21 CTA head/neck showed about 44% LICA stenosis. (CTA had been ordered for headaches and elevated BP in setting of recent COVID-19 and known prior intracranial aneurysm coiling from 2004). Currently, is considered asymptomatic of carotid disease.    History includes former smoker (quit 8/1/177), CAD (multiple PCI prior to off-pump CABG x1: LIMA-LAD 07/11/09; MI, occluded LIMA-LAD, s/p DES LAD just after D1 09/07/09; patient LAD stent, subtotal occlusion CX not amendable to PCI 12/09/19, medical therapy), HTN, HLD, carotid artery stenosis, COPD, tracheobronchomalacia, dyspnea, OSA (intolerant to CPAP), horseshoe kidney, GERD, melanoma, brain surgery (coiling ACA aneurysm 2004), headaches (s/p greater occipital nerve blocks for chronic HA/occiptial neuralgia; 12/14/20 by Dorene Ar, MD), poor short-term memory. Reported "Hard to wake up" after anesthesia. + COVID-19 01/16/21.  Last primary visit 03/05/21 with Pablo Lawrence, NP. She is aware of surgery plans. HTN was much better controlled. 4 month follow-up planned.  Last cardiology visit 11/08/20 with Duwayne Heck, PA. Medical therapy recommended after 11/2019 repeat cath (known occluded LIMA-LAD with patient LAD stent, subtotal occlusion not amendable to PCI). No angina. Continue Imdur. Follow-up six months. Dr. Stephens Shire note mentions letting Dr. Prince Rome know about surgery plans. Plavix to be held for 5 days prior to surgery (last dose 03/09/21).   He had a + PCR COVID-19 test on 01/16/21 ([place]), so he should not need a repeat test.  Staff to follow-up with patient to let him know.   Anesthesia team to evaluate on the day of surgery.    VS: BP (!) 144/83   Pulse 77   Temp (!) 36.3 C (Oral)   Resp 19   Ht 5\' 5"  (1.651 m)   Wt 82.8 kg   SpO2 99%   BMI 30.39 kg/m    PROVIDERS: Medicine, [place]; [place], Loma Sousa, NP is PCP - Valetta Fuller, MD is cardiologist [facility] Everywhere) - Christinia Gully, MD is pulmonologist. Last visit with Rexene Edison, NP 04/04/20. Referred to allergist for cough, suspected allergic component. Improved compliance with CPAP recommended.  Tommas Olp, MD is neurologist ([place]) - Rexene Alberts, DO is allergist   LABS: Labs reviewed: Acceptable for surgery. (all labs ordered are listed, but only abnormal results are displayed)  Labs Reviewed  SURGICAL PCR SCREEN - Abnormal; Notable for the following components:      Result Value   Staphylococcus aureus POSITIVE (*)    All other components within normal limits  COMPREHENSIVE METABOLIC PANEL - Abnormal; Notable for the following components:   Total Protein 6.4 (*)    All other components within normal limits  HEMOGLOBIN A1C - Abnormal; Notable for the following components:   Hgb A1c MFr Bld 6.8 (*)    All other components within normal limits  GLUCOSE, CAPILLARY - Abnormal; Notable for the following components:   Glucose-Capillary 114 (*)    All other components within normal limits  CBC  PROTIME-INR  APTT  URINALYSIS, ROUTINE W REFLEX MICROSCOPIC  BLOOD GAS, ARTERIAL  TYPE AND SCREEN    PFTs9/9/20:"FEV1 2.00(82% ) ratio 0.81p 3% improvement  from saba p nothingprior to study with DLCO 19.99(93%) corrects to4.67(115%) for alv volume and FV curve nlERV 28%"   IMAGES: CTA Head/Neck 02/20/21 ([facility]): IMPRESSION:  1. Less than 50% cervical right internal carotid artery stenosis  2. 78% cervical stenosis of the left internal carotid artery at the top of the  carotid bulb  3. Previous coiling of anterior communicating artery aneurysm. No aneurysm recurrence identified.  4. No other aneurysm identified.   [place] Abd 07/17/17 ([facility]): IMPRESSION:  1. No acute findings.  2. Horseshoe kidney.  3. Fatty pancreas.    EKG: 03/08/21:  Sinus rhythm with occasional Premature ventricular complexes Borderline ECG No previous tracing Confirmed by Ena Dawley [phone]) on 03/08/2021 4:25:36 PM   CV: Cardiac cath 12/09/19 ([facility]): Coronary Angiography  1. Left Main -widely patent  2. Left anterior descending artery -there is a stent in the proximal  portion. There is mild 10 to 20% stenosis within the stent  3. Left Circumflex -the circumflex is subtotally occluded and fills late.   There is an obtuse marginalartery that trifurcates and has 50% stenosis  just prior to the trifurcation.  4. Right Coronary Artery -diffusely small. The PDA and PLV branches are  widely patent  5. He had a previous LIMA to the LAD which is known to be occluded and  was not injected today   Additional comments on angiography: Right dominant   Hemodynamics  1. Aortic Pressure -124/56 mmHg  2. Left Ventricular -131/18 mmHg   CONCLUSIONS:  1. LAD stent is patent  2. His circumflex is subtotally occluded and not amenable to PCI. The  other disease is nonobstructive  3. We will plan medical therapy   Echo 12/03/19 [facility] Cardiology; copy received from [facility] office): - Left ventricle: Normal left ventricular structure and size.  The calculated left ventricular ejection fraction is 51%.  There is mild hypertrophy.  Systolic function is low normal with an ejection fraction of 50 to 55%; GLS equal 18.2% from the apical 4, 3, 2 chamber views respectively.  Wall motion is within normal limits.  There is grade 1 (mild) diastolic dysfunction. - Right ventricle: Right ventricle cavity appears normal.  Systolic function is normal.  Wall thickness is  normal. - Left atrium: Left atrium cavity is mildly dilated. - Right Atrium: Normal sized right atrium. - Mitral valve: The leaflets are moderately thickened.  There is trace regurgitation.  The valve has normal function. - Tricuspid valve: Tricuspid valve structure is normal.  There is mild regurgitation.  The right ventricular systolic pressure is mildly elevated.  RVSP 30 mmHg. - Aortic valve: Normal tricuspid aortic valve.  There is mild sclerosis.  There is no regurgitation or stenosis. - Pulmonic valve: Trace regurgitation. - Ascending aorta: The aorta appears normal in size. - Pericardium: There is no pericardial effusion.   Past Medical History:  Diagnosis Date  . Adenomatous colon polyp   . Barrett esophagus   . CAD (coronary artery disease)   . Chronic headaches   . Complication of anesthesia    Hard to wake up  . COPD (chronic obstructive pulmonary disease) ([place])   . Coronary atherosclerosis   . Diverticulitis of colon December 2012   Diagnosed by CT scan [facility]  . DM (diabetes mellitus) ([place])   . Dyspnea   . GERD (gastroesophageal reflux disease)   . Hemorrhoids   . History of kidney stones   . HLD (hyperlipidemia)   . Horseshoe kidney   .  HTN (hypertension)   . Melanoma ([place])   . Poor short term memory   . Sleep apnea    Can not use a cpap  . Tracheobronchomalacia     Past Surgical History:  Procedure Laterality Date  . BRAIN SURGERY     Aneurysm 2004  . CARDIAC CATHETERIZATION  01/01/2013  . CHOLECYSTECTOMY  01/12/2013   Dr. Gerlene Fee  . COLONOSCOPY W/ BIOPSIES AND POLYPECTOMY  09/08/2007   adenomatous polyps, diverticulosis, external hemorrhoids  . CORONARY ARTERY BYPASS GRAFT  07/11/2009   off-pump CABG: LIMA-LAD 07/11/09; occluded graft 09/07/09, s/p DES LAD just after DIAG  . CORONARY STENT PLACEMENT     x 5  . HERNIA REPAIR     abdominal  . TONSILLECTOMY    . UMBILICAL HERNIA REPAIR    . UPPER GASTROINTESTINAL ENDOSCOPY   10/12/2010   barrett's, fondic gland polyps, duodenitis    MEDICATIONS: . Ascorbic Acid (VITAMIN C WITH ROSE HIPS) 500 MG tablet  . aspirin 325 MG EC tablet  . Cholecalciferol (VITAMIN D) 50 MCG (2000 UT) CAPS  . cloNIDine (CATAPRES) 0.1 MG tablet  . clopidogrel (PLAVIX) 75 MG tablet  . dicyclomine (BENTYL) 20 MG tablet  . ezetimibe (ZETIA) 10 MG tablet  . famotidine (PEPCID) 20 MG tablet  . HYDROcodone-acetaminophen (NORCO) 7.5-325 MG tablet  . ipratropium (ATROVENT) 0.06 % nasal spray  . isosorbide mononitrate (IMDUR) 60 MG 24 hr tablet  . losartan (COZAAR) 100 MG tablet  . MAGNESIUM PO  . montelukast (SINGULAIR) 10 MG tablet  . pantoprazole (PROTONIX) 40 MG tablet  . Potassium 99 MG TABS  . Respiratory Therapy Supplies (FLUTTER) DEVI  . sertraline (ZOLOFT) 50 MG tablet  . Vitamin A 2400 MCG (8000 UT) CAPS   No current facility-administered medications for this encounter.    Myra Gianotti, PA-C Surgical Short Stay/Anesthesiology [facility] Phone [phone] [employer] Phone [phone] 03/09/2021 5:35 PM   Addendum:   Allyne Gee, RN in Dr. Stephens Shire office contacted Dr. Prince Rome, pt's cardiologist, and reported the following: "I called the cardiology office to be sure - Powers (cardiologist) says as long as he's not having chest pain - he should be ok from heart standpoint. (Also mentions chronic SOB - but he's recently seen a pulmonologist)"  Willeen Cass, PhD, FNP-BC [facility] Short Stay Surgical Center/Anesthesiology Phone: [phone] 03/12/2021 3:54 PM

## 2021-03-09 NOTE — Anesthesia Preprocedure Evaluation (Addendum)
Anesthesia Evaluation  Patient identified by MRN, date of birth, ID band Patient awake    Reviewed: Allergy & Precautions, NPO status , Patient's Chart, lab work & pertinent test results  History of Anesthesia Complications (+) PROLONGED EMERGENCE and history of anesthetic complications  Airway Mallampati: II  TM Distance: >3 FB Neck ROM: Full    Dental  (+) Dental Advisory Given, Edentulous Lower, Edentulous Upper   Pulmonary asthma , sleep apnea , COPD,  COPD inhaler, former smoker,  + COVID-19 01/16/21   Pulmonary exam normal breath sounds clear to auscultation       Cardiovascular hypertension, Pt. on medications (-) angina+ CAD (multiple PCI prior to off-pump CABG x1: LIMA-LAD 07/11/09; MI, occluded LIMA-LAD, s/p DES LAD just after D1 09/07/09; patient LAD stent, subtotal occlusion CX not amendable to PCI 12/09/19, medical therapy), + Cardiac Stents, + CABG, + Peripheral Vascular Disease and + DOE  Normal cardiovascular exam Rhythm:Regular Rate:Normal   Echo 12/03/19 [facility] Cardiology; copy received from [facility] office): - Left ventricle: Normal left ventricular structure and size.  The calculated left ventricular ejection fraction is 51%.  There is mild hypertrophy.  Systolic function is low normal with an ejection fraction of 50 to 55%; GLS equal 18.2% from the apical 4, 3, 2 chamber views respectively.  Wall motion is within normal limits.  There is grade 1 (mild) diastolic dysfunction. - Right ventricle: Right ventricle cavity appears normal.  Systolic function is normal.  Wall thickness is normal. - Left atrium: Left atrium cavity is mildly dilated. - Right Atrium: Normal sized right atrium. - Mitral valve: The leaflets are moderately thickened.  There is trace regurgitation.  The valve has normal function. - Tricuspid valve: Tricuspid valve structure is normal.  There is mild regurgitation.  The right ventricular systolic  pressure is mildly elevated.  RVSP 30 mmHg. - Aortic valve: Normal tricuspid aortic valve.  There is mild sclerosis.  There is no regurgitation or stenosis. - Pulmonic valve: Trace regurgitation. - Ascending aorta: The aorta appears normal in size. - Pericardium: There is no pericardial effusion.   Neuro/Psych  Headaches, prior intracranial aneurysm coiling from 2004 CVA, No Residual Symptoms negative psych ROS   GI/Hepatic Neg liver ROS, GERD  Medicated,  Endo/Other  diabetes, Type 2Obesity   Renal/GU negative Renal ROS     Musculoskeletal negative musculoskeletal ROS (+)   Abdominal   Peds  Hematology  (+) Blood dyscrasia (Plavix), ,   Anesthesia Other Findings   Reproductive/Obstetrics                           Anesthesia Physical Anesthesia Plan  ASA: IV  Anesthesia Plan: General   Post-op Pain Management:    Induction: Intravenous  PONV Risk Score and Plan: 2 and Dexamethasone, Ondansetron and Propofol infusion  Airway Management Planned: Oral ETT  Additional Equipment: Arterial line  Intra-op Plan:   Post-operative Plan: Extubation in OR  Informed Consent: I have reviewed the patients History and Physical, chart, labs and discussed the procedure including the risks, benefits and alternatives for the proposed anesthesia with the patient or authorized representative who has indicated his/her understanding and acceptance.     Dental advisory given  Plan Discussed with: CRNA  Anesthesia Plan Comments: (2nd PIV  PAT note written by Myra Gianotti, PA-C. )      Anesthesia Quick Evaluation

## 2021-03-09 NOTE — Progress Notes (Signed)
Pt called and advised not to show up for his covid test on 03/13/21 due to pt testing + for covid on 01/16/21(results in Care Everywhere-found by PA Ebony Hail in a progress note). Based on the guidelines the pt is in the 90 day window to not retest. The pt is still expected to quarantine until their procedure. Therefore, the pt can still have the scheduled procedure.

## 2021-03-13 ENCOUNTER — Other Ambulatory Visit (HOSPITAL_COMMUNITY): Payer: Medicare Other

## 2021-03-14 ENCOUNTER — Inpatient Hospital Stay (HOSPITAL_COMMUNITY): Payer: Medicare Other | Admitting: Anesthesiology

## 2021-03-14 ENCOUNTER — Encounter (HOSPITAL_COMMUNITY)
Admission: RE | Disposition: A | Discharge status: Home / Self Care | Payer: Self-pay | Source: Home / Self Care | Attending: Surgery

## 2021-03-14 ENCOUNTER — Other Ambulatory Visit: Payer: Self-pay

## 2021-03-14 ENCOUNTER — Inpatient Hospital Stay (HOSPITAL_COMMUNITY): Payer: Medicare Other | Admitting: Vascular Surgery

## 2021-03-14 ENCOUNTER — Inpatient Hospital Stay (HOSPITAL_COMMUNITY)
Admission: RE | Admit: 2021-03-14 | Discharge: 2021-03-15 | DRG: 039 | Disposition: A | Discharge status: Home / Self Care | Payer: Medicare Other | Attending: Surgery | Admitting: Surgery

## 2021-03-14 ENCOUNTER — Encounter (HOSPITAL_COMMUNITY): Payer: Self-pay | Admitting: Surgery

## 2021-03-14 DIAGNOSIS — K219 Gastro-esophageal reflux disease without esophagitis: Secondary | ICD-10-CM | POA: Diagnosis present

## 2021-03-14 DIAGNOSIS — I6521 Occlusion and stenosis of right carotid artery: Secondary | ICD-10-CM | POA: Diagnosis present

## 2021-03-14 DIAGNOSIS — Z87891 Personal history of nicotine dependence: Secondary | ICD-10-CM

## 2021-03-14 DIAGNOSIS — R29810 Facial weakness: Secondary | ICD-10-CM | POA: Diagnosis not present

## 2021-03-14 DIAGNOSIS — E119 Type 2 diabetes mellitus without complications: Secondary | ICD-10-CM | POA: Diagnosis present

## 2021-03-14 DIAGNOSIS — I6522 Occlusion and stenosis of left carotid artery: Principal | ICD-10-CM | POA: Diagnosis present

## 2021-03-14 DIAGNOSIS — Z79899 Other long term (current) drug therapy: Secondary | ICD-10-CM | POA: Diagnosis not present

## 2021-03-14 DIAGNOSIS — Z951 Presence of aortocoronary bypass graft: Secondary | ICD-10-CM | POA: Diagnosis not present

## 2021-03-14 DIAGNOSIS — Z86010 Personal history of colonic polyps: Secondary | ICD-10-CM | POA: Diagnosis not present

## 2021-03-14 DIAGNOSIS — I10 Essential (primary) hypertension: Secondary | ICD-10-CM | POA: Diagnosis present

## 2021-03-14 DIAGNOSIS — R008 Other abnormalities of heart beat: Secondary | ICD-10-CM | POA: Diagnosis present

## 2021-03-14 DIAGNOSIS — Z7902 Long term (current) use of antithrombotics/antiplatelets: Secondary | ICD-10-CM

## 2021-03-14 DIAGNOSIS — Z7982 Long term (current) use of aspirin: Secondary | ICD-10-CM | POA: Diagnosis not present

## 2021-03-14 DIAGNOSIS — E785 Hyperlipidemia, unspecified: Secondary | ICD-10-CM | POA: Diagnosis present

## 2021-03-14 DIAGNOSIS — G4730 Sleep apnea, unspecified: Secondary | ICD-10-CM | POA: Diagnosis present

## 2021-03-14 DIAGNOSIS — Z888 Allergy status to other drugs, medicaments and biological substances status: Secondary | ICD-10-CM | POA: Diagnosis not present

## 2021-03-14 DIAGNOSIS — Z8249 Family history of ischemic heart disease and other diseases of the circulatory system: Secondary | ICD-10-CM

## 2021-03-14 DIAGNOSIS — J449 Chronic obstructive pulmonary disease, unspecified: Secondary | ICD-10-CM | POA: Diagnosis present

## 2021-03-14 DIAGNOSIS — I2510 Atherosclerotic heart disease of native coronary artery without angina pectoris: Secondary | ICD-10-CM | POA: Diagnosis present

## 2021-03-14 DIAGNOSIS — Z85820 Personal history of malignant melanoma of skin: Secondary | ICD-10-CM | POA: Diagnosis not present

## 2021-03-14 DIAGNOSIS — Z833 Family history of diabetes mellitus: Secondary | ICD-10-CM | POA: Diagnosis not present

## 2021-03-14 DIAGNOSIS — Z9861 Coronary angioplasty status: Secondary | ICD-10-CM

## 2021-03-14 HISTORY — PX: ENDARTERECTOMY: SHX5162

## 2021-03-14 HISTORY — PX: OTHER SURGICAL HISTORY: SHX169

## 2021-03-14 LAB — CBC
HCT: 40.4 % (ref 39.0–52.0)
Hemoglobin: 13.7 g/dL (ref 13.0–17.0)
MCH: 31.8 pg (ref 26.0–34.0)
MCHC: 33.9 g/dL (ref 30.0–36.0)
MCV: 93.7 fL (ref 80.0–100.0)
Platelets: 253 10*3/uL (ref 150–400)
RBC: 4.31 MIL/uL (ref 4.22–5.81)
RDW: 14.3 % (ref 11.5–15.5)
WBC: 12.2 10*3/uL — ABNORMAL HIGH (ref 4.0–10.5)
nRBC: 0 % (ref 0.0–0.2)

## 2021-03-14 LAB — CREATININE, SERUM
Creatinine, Ser: 1.18 mg/dL (ref 0.61–1.24)
GFR, Estimated: >60 mL/min (ref >60)

## 2021-03-14 LAB — POCT ACTIVATED CLOTTING TIME: Activated Clotting Time: 261 seconds

## 2021-03-14 LAB — GLUCOSE, CAPILLARY
Glucose-Capillary: 133 mg/dL — ABNORMAL HIGH (ref 70–99)
Glucose-Capillary: 147 mg/dL — ABNORMAL HIGH (ref 70–99)

## 2021-03-14 LAB — ABO/RH: ABO/RH(D): O POS

## 2021-03-14 SURGERY — ENDARTERECTOMY, CAROTID
Anesthesia: General | Laterality: Left

## 2021-03-14 MED ORDER — 0.9 % SODIUM CHLORIDE (POUR BTL) OPTIME
TOPICAL | Status: DC | PRN
  Administered 2021-03-14: 2000 mL

## 2021-03-14 MED ORDER — LACTATED RINGERS IV SOLN: INTRAVENOUS | Status: DC

## 2021-03-14 MED ORDER — SODIUM CHLORIDE 0.9 % IV SOLN: INTRAVENOUS | Status: DC

## 2021-03-14 MED ORDER — LIDOCAINE 2% (20 MG/ML) 5 ML SYRINGE
INTRAMUSCULAR | Status: DC | PRN
  Administered 2021-03-14: 80 mg via INTRAVENOUS

## 2021-03-14 MED ORDER — LOSARTAN POTASSIUM 50 MG PO TABS
100.0000 mg | ORAL_TABLET | Freq: Every day | ORAL | Status: DC
  Administered 2021-03-15: 100 mg via ORAL
  Filled 2021-03-14: qty 2

## 2021-03-14 MED ORDER — FENTANYL CITRATE (PF) 100 MCG/2ML IJ SOLN: 25.0000 ug | INTRAMUSCULAR | Status: DC | PRN

## 2021-03-14 MED ORDER — GUAIFENESIN-DM 100-10 MG/5ML PO SYRP: 15.0000 mL | ORAL_SOLUTION | ORAL | Status: DC | PRN

## 2021-03-14 MED ORDER — ACETAMINOPHEN 325 MG PO TABS: 325.0000 mg | ORAL_TABLET | ORAL | Status: DC | PRN

## 2021-03-14 MED ORDER — SODIUM CHLORIDE 0.9 % IV SOLN
INTRAVENOUS | Status: AC
  Filled 2021-03-14: qty 1.2

## 2021-03-14 MED ORDER — PANTOPRAZOLE SODIUM 40 MG PO TBEC
40.0000 mg | DELAYED_RELEASE_TABLET | Freq: Every day | ORAL | Status: DC
Start: 2021-03-14 — End: 2021-03-15
  Filled 2021-03-14: qty 1

## 2021-03-14 MED ORDER — HEPARIN SODIUM (PORCINE) 1000 UNIT/ML IJ SOLN
INTRAMUSCULAR | Status: DC | PRN
  Administered 2021-03-14: 9000 [IU] via INTRAVENOUS

## 2021-03-14 MED ORDER — SUGAMMADEX SODIUM 200 MG/2ML IV SOLN
INTRAVENOUS | Status: DC | PRN
  Administered 2021-03-14: 200 mg via INTRAVENOUS

## 2021-03-14 MED ORDER — ONDANSETRON HCL 4 MG/2ML IJ SOLN
INTRAMUSCULAR | Status: DC | PRN
  Administered 2021-03-14: 4 mg via INTRAVENOUS

## 2021-03-14 MED ORDER — ASPIRIN EC 325 MG PO TBEC
325.0000 mg | DELAYED_RELEASE_TABLET | Freq: Every day | ORAL | Status: DC
  Administered 2021-03-14: 325 mg via ORAL
  Filled 2021-03-14: qty 1

## 2021-03-14 MED ORDER — MONTELUKAST SODIUM 10 MG PO TABS
10.0000 mg | ORAL_TABLET | Freq: Every day | ORAL | Status: DC
  Administered 2021-03-14: 10 mg via ORAL
  Filled 2021-03-14: qty 1

## 2021-03-14 MED ORDER — EPHEDRINE SULFATE 50 MG/ML IJ SOLN
INTRAMUSCULAR | Status: DC | PRN
  Administered 2021-03-14: 10 mg via INTRAVENOUS
  Administered 2021-03-14: 5 mg via INTRAVENOUS

## 2021-03-14 MED ORDER — CHLORHEXIDINE GLUCONATE 0.12 % MT SOLN
15.0000 mL | Freq: Once | OROMUCOSAL | Status: AC
  Administered 2021-03-14: 15 mL via OROMUCOSAL
  Filled 2021-03-14: qty 15

## 2021-03-14 MED ORDER — CHLORHEXIDINE GLUCONATE CLOTH 2 % EX PADS: 6.0000 | MEDICATED_PAD | Freq: Once | CUTANEOUS | Status: DC

## 2021-03-14 MED ORDER — IPRATROPIUM BROMIDE 0.06 % NA SOLN: 2.0000 | Freq: Three times a day (TID) | NASAL | Status: DC | PRN

## 2021-03-14 MED ORDER — ESMOLOL HCL 100 MG/10ML IV SOLN
INTRAVENOUS | Status: DC | PRN
  Administered 2021-03-14 (×2): 20 mg via INTRAVENOUS

## 2021-03-14 MED ORDER — ONDANSETRON HCL 4 MG/2ML IJ SOLN: 4.0000 mg | Freq: Once | INTRAMUSCULAR | Status: DC | PRN

## 2021-03-14 MED ORDER — LACTATED RINGERS IV SOLN: INTRAVENOUS | Status: DC | PRN

## 2021-03-14 MED ORDER — LABETALOL HCL 5 MG/ML IV SOLN: 10.0000 mg | INTRAVENOUS | Status: DC | PRN

## 2021-03-14 MED ORDER — HEMOSTATIC AGENTS (NO CHARGE) OPTIME
TOPICAL | Status: DC | PRN
  Administered 2021-03-14: 1 via TOPICAL

## 2021-03-14 MED ORDER — FAMOTIDINE 20 MG PO TABS
20.0000 mg | ORAL_TABLET | Freq: Every day | ORAL | Status: DC
  Filled 2021-03-14: qty 1

## 2021-03-14 MED ORDER — MAGNESIUM 200 MG PO TABS: 400.0000 mg | ORAL_TABLET | Freq: Every day | ORAL | Status: DC

## 2021-03-14 MED ORDER — DICYCLOMINE HCL 20 MG PO TABS
20.0000 mg | ORAL_TABLET | Freq: Two times a day (BID) | ORAL | Status: DC
  Administered 2021-03-14: 20 mg via ORAL
  Filled 2021-03-14 (×2): qty 1

## 2021-03-14 MED ORDER — ORAL CARE MOUTH RINSE: 15.0000 mL | Freq: Once | OROMUCOSAL | Status: AC

## 2021-03-14 MED ORDER — DEXAMETHASONE SODIUM PHOSPHATE 10 MG/ML IJ SOLN
INTRAMUSCULAR | Status: DC | PRN
  Administered 2021-03-14: 5 mg via INTRAVENOUS

## 2021-03-14 MED ORDER — PROTAMINE SULFATE 10 MG/ML IV SOLN
INTRAVENOUS | Status: DC | PRN
  Administered 2021-03-14: 50 mg via INTRAVENOUS

## 2021-03-14 MED ORDER — ROCURONIUM BROMIDE 10 MG/ML (PF) SYRINGE
PREFILLED_SYRINGE | INTRAVENOUS | Status: DC | PRN
  Administered 2021-03-14: 60 mg via INTRAVENOUS

## 2021-03-14 MED ORDER — PHENOL 1.4 % MT LIQD: 1.0000 | OROMUCOSAL | Status: DC | PRN

## 2021-03-14 MED ORDER — PHENYLEPHRINE HCL-NACL 10-0.9 MG/250ML-% IV SOLN
INTRAVENOUS | Status: DC | PRN
  Administered 2021-03-14: 25 ug/min via INTRAVENOUS

## 2021-03-14 MED ORDER — FENTANYL CITRATE (PF) 250 MCG/5ML IJ SOLN
INTRAMUSCULAR | Status: AC
  Filled 2021-03-14: qty 5

## 2021-03-14 MED ORDER — LIDOCAINE HCL (PF) 1 % IJ SOLN
INTRAMUSCULAR | Status: AC
  Filled 2021-03-14: qty 30

## 2021-03-14 MED ORDER — DOCUSATE SODIUM 100 MG PO CAPS
100.0000 mg | ORAL_CAPSULE | Freq: Every day | ORAL | Status: DC
  Filled 2021-03-14: qty 1

## 2021-03-14 MED ORDER — METOPROLOL TARTRATE 5 MG/5ML IV SOLN: 2.0000 mg | INTRAVENOUS | Status: DC | PRN

## 2021-03-14 MED ORDER — CLONIDINE HCL 0.1 MG PO TABS
0.1000 mg | ORAL_TABLET | Freq: Every day | ORAL | Status: DC
Start: 2021-03-14 — End: 2021-03-15
  Administered 2021-03-14 – 2021-03-15 (×2): 0.1 mg via ORAL
  Filled 2021-03-14: qty 1

## 2021-03-14 MED ORDER — ALUM & MAG HYDROXIDE-SIMETH 200-200-20 MG/5ML PO SUSP: 15.0000 mL | ORAL | Status: DC | PRN

## 2021-03-14 MED ORDER — ISOSORBIDE MONONITRATE ER 60 MG PO TB24
60.0000 mg | ORAL_TABLET | Freq: Every day | ORAL | Status: DC
  Administered 2021-03-14: 60 mg via ORAL
  Filled 2021-03-14: qty 1

## 2021-03-14 MED ORDER — ACETAMINOPHEN 650 MG RE SUPP: 325.0000 mg | RECTAL | Status: DC | PRN

## 2021-03-14 MED ORDER — FENTANYL CITRATE (PF) 250 MCG/5ML IJ SOLN
INTRAMUSCULAR | Status: DC | PRN
  Administered 2021-03-14: 50 ug via INTRAVENOUS
  Administered 2021-03-14: 100 ug via INTRAVENOUS

## 2021-03-14 MED ORDER — HEPARIN SODIUM (PORCINE) 5000 UNIT/ML IJ SOLN
5000.0000 [IU] | Freq: Three times a day (TID) | INTRAMUSCULAR | Status: DC
  Administered 2021-03-15: 5000 [IU] via SUBCUTANEOUS
  Filled 2021-03-14: qty 1

## 2021-03-14 MED ORDER — PHENYLEPHRINE HCL (PRESSORS) 10 MG/ML IV SOLN
INTRAVENOUS | Status: DC | PRN
  Administered 2021-03-14: 120 ug via INTRAVENOUS

## 2021-03-14 MED ORDER — SODIUM CHLORIDE 0.9 % IV SOLN
INTRAVENOUS | Status: DC | PRN
  Administered 2021-03-14: 500 mL

## 2021-03-14 MED ORDER — POTASSIUM 99 MG PO TABS: 99.0000 mg | ORAL_TABLET | Freq: Every day | ORAL | Status: DC

## 2021-03-14 MED ORDER — CLOPIDOGREL BISULFATE 75 MG PO TABS
75.0000 mg | ORAL_TABLET | Freq: Every day | ORAL | Status: DC
  Administered 2021-03-14: 75 mg via ORAL
  Filled 2021-03-14: qty 1

## 2021-03-14 MED ORDER — ACETAMINOPHEN 500 MG PO TABS
1000.0000 mg | ORAL_TABLET | Freq: Once | ORAL | Status: AC
  Administered 2021-03-14: 1000 mg via ORAL
  Filled 2021-03-14: qty 2

## 2021-03-14 MED ORDER — SERTRALINE HCL 50 MG PO TABS
50.0000 mg | ORAL_TABLET | Freq: Every day | ORAL | Status: DC
Start: 2021-03-14 — End: 2021-03-15
  Filled 2021-03-14: qty 1

## 2021-03-14 MED ORDER — MAGNESIUM OXIDE 400 (241.3 MG) MG PO TABS
400.0000 mg | ORAL_TABLET | Freq: Every day | ORAL | Status: DC
  Filled 2021-03-14: qty 1

## 2021-03-14 MED ORDER — HYDRALAZINE HCL 20 MG/ML IJ SOLN: 5.0000 mg | INTRAMUSCULAR | Status: DC | PRN

## 2021-03-14 MED ORDER — POTASSIUM CHLORIDE CRYS ER 20 MEQ PO TBCR: 20.0000 meq | EXTENDED_RELEASE_TABLET | Freq: Every day | ORAL | Status: DC | PRN

## 2021-03-14 MED ORDER — CEFAZOLIN SODIUM-DEXTROSE 2-4 GM/100ML-% IV SOLN
2.0000 g | Freq: Three times a day (TID) | INTRAVENOUS | Status: AC
  Administered 2021-03-14 (×2): 2 g via INTRAVENOUS
  Filled 2021-03-14 (×2): qty 100

## 2021-03-14 MED ORDER — REMIFENTANIL HCL 2 MG IV SOLR
INTRAVENOUS | Status: DC | PRN
  Administered 2021-03-14: .2 ug/kg/min via INTRAVENOUS

## 2021-03-14 MED ORDER — EZETIMIBE 10 MG PO TABS
10.0000 mg | ORAL_TABLET | ORAL | Status: DC
  Administered 2021-03-15: 10 mg via ORAL
  Filled 2021-03-14: qty 1

## 2021-03-14 MED ORDER — MAGNESIUM SULFATE 2 GM/50ML IV SOLN: 2.0000 g | Freq: Every day | INTRAVENOUS | Status: DC | PRN

## 2021-03-14 MED ORDER — HYDROMORPHONE HCL 1 MG/ML IJ SOLN: 0.5000 mg | INTRAMUSCULAR | Status: DC | PRN

## 2021-03-14 MED ORDER — OXYCODONE-ACETAMINOPHEN 5-325 MG PO TABS
1.0000 | ORAL_TABLET | ORAL | Status: DC | PRN
  Administered 2021-03-14: 2 via ORAL
  Administered 2021-03-14: 1 via ORAL
  Administered 2021-03-15: 2 via ORAL
  Filled 2021-03-14 (×3): qty 2

## 2021-03-14 MED ORDER — ONDANSETRON HCL 4 MG/2ML IJ SOLN: 4.0000 mg | Freq: Four times a day (QID) | INTRAMUSCULAR | Status: DC | PRN

## 2021-03-14 MED ORDER — SODIUM CHLORIDE 0.9 % IV SOLN: 500.0000 mL | Freq: Once | INTRAVENOUS | Status: DC | PRN

## 2021-03-14 MED ORDER — PROPOFOL 10 MG/ML IV BOLUS
INTRAVENOUS | Status: DC | PRN
  Administered 2021-03-14: 30 mg via INTRAVENOUS
  Administered 2021-03-14 (×2): 50 mg via INTRAVENOUS

## 2021-03-14 MED ORDER — SODIUM CHLORIDE 0.9 % IV SOLN
0.0125 ug/kg/min | INTRAVENOUS | Status: DC
  Filled 2021-03-14: qty 2000

## 2021-03-14 MED ORDER — CEFAZOLIN SODIUM-DEXTROSE 2-4 GM/100ML-% IV SOLN
2.0000 g | INTRAVENOUS | Status: AC
  Administered 2021-03-14: 2 g via INTRAVENOUS
  Filled 2021-03-14: qty 100

## 2021-03-14 MED ORDER — HYDRALAZINE HCL 20 MG/ML IJ SOLN
INTRAMUSCULAR | Status: AC
  Administered 2021-03-14: 5 mg via INTRAVENOUS
  Filled 2021-03-14: qty 1

## 2021-03-14 MED ORDER — PROPOFOL 10 MG/ML IV BOLUS
INTRAVENOUS | Status: AC
  Filled 2021-03-14: qty 20

## 2021-03-14 SURGICAL SUPPLY — 47 items
CANISTER SUCT 3000ML PPV (MISCELLANEOUS) ×3 IMPLANT
CATH ROBINSON RED A/P 18FR (CATHETERS) ×3 IMPLANT
CATH SUCT 10FR WHISTLE TIP (CATHETERS) ×6 IMPLANT
CLIP VESOCCLUDE MED 6/CT (CLIP) ×3 IMPLANT
CLIP VESOCCLUDE SM WIDE 6/CT (CLIP) ×3 IMPLANT
COVER PROBE W GEL 5X96 (DRAPES) ×3 IMPLANT
COVER WAND RF STERILE (DRAPES) IMPLANT
DERMABOND ADVANCED (GAUZE/BANDAGES/DRESSINGS) ×2
DERMABOND ADVANCED .7 DNX12 (GAUZE/BANDAGES/DRESSINGS) ×1 IMPLANT
DRAIN CHANNEL 15F RND FF W/TCR (WOUND CARE) IMPLANT
ELECT REM PT RETURN 9FT ADLT (ELECTROSURGICAL) ×3
ELECTRODE REM PT RTRN 9FT ADLT (ELECTROSURGICAL) ×1 IMPLANT
EVACUATOR SILICONE 100CC (DRAIN) IMPLANT
GLOVE BIOGEL PI IND STRL 7.5 (GLOVE) ×1 IMPLANT
GLOVE BIOGEL PI INDICATOR 7.5 (GLOVE) ×2
GLOVE SURG SS PI 7.5 STRL IVOR (GLOVE) ×3 IMPLANT
GLOVE SURG UNDER POLY LF SZ7 (GLOVE) ×3 IMPLANT
GOWN STRL REUS W/ TWL LRG LVL3 (GOWN DISPOSABLE) ×2 IMPLANT
GOWN STRL REUS W/ TWL XL LVL3 (GOWN DISPOSABLE) ×1 IMPLANT
GOWN STRL REUS W/TWL LRG LVL3 (GOWN DISPOSABLE) ×6
GOWN STRL REUS W/TWL XL LVL3 (GOWN DISPOSABLE) ×3
HEMOSTAT SNOW SURGICEL 2X4 (HEMOSTASIS) IMPLANT
INSERT FOGARTY SM (MISCELLANEOUS) IMPLANT
KIT BASIN OR (CUSTOM PROCEDURE TRAY) ×3 IMPLANT
KIT SHUNT ARGYLE CAROTID ART 6 (VASCULAR PRODUCTS) IMPLANT
KIT TURNOVER KIT B (KITS) ×3 IMPLANT
NEEDLE HYPO 25GX1X1/2 BEV (NEEDLE) IMPLANT
NS IRRIG 1000ML POUR BTL (IV SOLUTION) ×6 IMPLANT
PACK CAROTID (CUSTOM PROCEDURE TRAY) ×3 IMPLANT
PAD ARMBOARD 7.5X6 YLW CONV (MISCELLANEOUS) ×6 IMPLANT
PATCH VASC XENOSURE 1CMX6CM (Vascular Products) ×2 IMPLANT
PATCH VASC XENOSURE 1X6 (Vascular Products) ×1 IMPLANT
POSITIONER HEAD DONUT 9IN (MISCELLANEOUS) ×3 IMPLANT
SET WALTER ACTIVATION W/DRAPE (SET/KITS/TRAYS/PACK) IMPLANT
SHUNT CAROTID BYPASS 10 (VASCULAR PRODUCTS) IMPLANT
SHUNT CAROTID BYPASS 12FRX15.5 (VASCULAR PRODUCTS) IMPLANT
SURGIFLO W/THROMBIN 8M KIT (HEMOSTASIS) ×3 IMPLANT
SUT ETHILON 3 0 PS 1 (SUTURE) IMPLANT
SUT PROLENE 6 0 BV (SUTURE) ×6 IMPLANT
SUT SILK 3 0 (SUTURE)
SUT SILK 3-0 18XBRD TIE 12 (SUTURE) IMPLANT
SUT VIC AB 3-0 SH 27 (SUTURE) ×4
SUT VIC AB 3-0 SH 27X BRD (SUTURE) ×2 IMPLANT
SUT VIC AB 3-0 X1 27 (SUTURE) ×6 IMPLANT
SYR CONTROL 10ML LL (SYRINGE) IMPLANT
TOWEL GREEN STERILE (TOWEL DISPOSABLE) ×3 IMPLANT
WATER STERILE IRR 1000ML POUR (IV SOLUTION) ×3 IMPLANT

## 2021-03-14 NOTE — Op Note (Signed)
Patient name: Angel Khan MRN: [id number] DOB: November 07, 1941 Sex: male  03/14/2021 Pre-operative Diagnosis: Asymptomatic   left carotid stenosis Post-operative diagnosis:  Same Surgeon:  Annamarie Major Assistants:  Ivin Booty, PA Procedure:    left carotid Endarterectomy with bovine pericardial patch angioplasty Anesthesia:  General Blood Loss:  100cc Specimens:  None  Findings:  85 %stenosis; Thrombus:  none  Indications: This is a 80 year old gentleman who was found to have asymptomatic left carotid stenosis by CT scan.  He comes in today for carotid endarterectomy.  Procedure:  The patient was identified in the holding area and taken to [place] [facility]  The patient was then placed supine on the table.   General endotrachial anesthesia was administered.  The patient was prepped and draped in the usual sterile fashion.  A time out was called and antibiotics were administered.  A PA was necessary to expect the procedure and assist with technical details.  The incision was made along the anterior border of the left sternocleidomastoid muscle.  Cautery was used to dissect through the subcutaneous tissue.  The platysma muscle was divided with cautery.  The internal jugular vein was exposed along its anterior medial border.  The common facial vein was exposed and then divided between 2-0 silk ties and metal clips.  The common carotid artery was then circumferentially exposed and encircled with an umbilical tape.  The vagus nerve was identified and protected.  Next sharp dissection was used to expose the external carotid artery and the superior thyroid artery.  The were encircled with a blue vessel loop and a 2-0 silk tie respectively.  Finally, the internal carotid was carefully dissected free.  An umbilical tape was placed around the internal carotid artery distal to the diseased segment.  The hypoglossal nerve was visualized throughout and protected.  The patient was given systemic heparinization.  A  bovine carotid patch was selected and prepared on the back table.  A 10 french shunt was also prepared.  After blood pressure readings were appropriate and the heparin had been given time to circulate, the internal carotid artery was occluded with a baby Gregory clamp.  The external and common carotid arteries were then occluded with vascular clamps and the 2-0 tie tightened on the superior thyroid artery.  A #11 blade was used to make an arteriotomy in the common carotid artery.  This was extended with Potts scissors along the anterior and lateral border of the common and internal carotid artery.  Approximately 85% stenosis was identified.  There was no thrombus identified.  The 10 french shunt was not placed, as there was excellent backbleeding.  A kleiner kuntz elevator was used to perform endarterectomy.  An eversion endarterectomy was performed in the external carotid artery.  A good distal endpoint was obtained in the internal carotid artery.  The specimen was removed and sent to pathology.  Heparinized saline was used to irrigate the endarterectomized field.  All potential embolic debris was removed.  Bovine pericardial patch angioplasty was then performed using a running 6-0 Prolene.. The common internal and external carotid arteries were all appropriately flushed. The artery was again irrigated with heparin saline.  The anastomosis was then secured. The clamp was first released on the external carotid artery followed by the common carotid artery approximately 30 seconds later, bloodflow was reestablish through the internal carotid artery.  Next, a hand-held  Doppler was used to evaluate the signals in the common, external, and internal  carotid arteries, all  of which had appropriate signals. I then administered  50 mg protamine. The wound was then irrigated.  After hemostasis was achieved, the carotid sheath was reapproximated with 3-0 Vicryl. The  platysma muscle was reapproximated with running 3-0  Vicryl. The skin  was closed with 4-0 Vicryl. Dermabond was placed on the skin. The  patient was then successfully extubated. His neurologic exam was  similar to his preprocedural exam. The patient was then taken to recovery room  in stable condition. There were no complications.     Disposition:  To PACU in stable condition.  Relevant Operative Details: The patient had a low bifurcation.  The hypoglossal nerve draped over near the carotid bifurcation.  This was protected throughout the procedure.  I did not place a shunt as there was excellent backbleeding.  He had an ulcerated 85% stenosis.  A bovine patch was utilized.  Theotis Burrow, M.D., [facility] [place] Office: [phone] Pager:  [phone]

## 2021-03-14 NOTE — Progress Notes (Addendum)
Pt arrived from unit from PACU. VSS.Will continue to monitor. CHG given Pt. Oriented to unit pt A/Ox4 mild Left facial droop.  The patient left the office before the visit was finished. Neck incision level 0   Phoebe Sharps, RN

## 2021-03-14 NOTE — Progress Notes (Signed)
Mobility Specialist - Progress Note   03/14/21 1626  Mobility  Activity Ambulated in hall  Level of Assistance Standby assist, set-up cues, supervision of patient - no hands on  Assistive Device None  Distance Ambulated (ft) 470 ft  Mobility Response Tolerated well  Mobility performed by Mobility specialist  $Mobility charge 1 Mobility   Pre-mobility: 80 HR, 101/73 BP Post-mobility: 87 HR, 109/60 BP  Pt missed urinal in bed, he stood on his own while his gown was changed. NT changed sheets while he was ambulating. Pt asx throughout. Pt back in bed after walk, bed in chair conformation.   Pricilla Handler Mobility Specialist Mobility Specialist Phone: [phone]

## 2021-03-14 NOTE — Transfer of Care (Signed)
Immediate Anesthesia Transfer of Care Note  Patient: Angel Khan  Procedure(s) Performed: LEFT CAROTID ARTERY ENDARTERECTOMY (Left )  Patient Location: PACU  Anesthesia Type:General  Level of Consciousness: awake, alert  and oriented  Airway & Oxygen Therapy: Patient Spontanous Breathing  Post-op Assessment: Report given to RN and Post -op Vital signs reviewed and stable  Post vital signs: Reviewed and stable  Last Vitals:  Vitals Value Taken Time  BP 146/87 03/14/21 1044  Temp    Pulse 84 03/14/21 1045  Resp 15 03/14/21 1045  SpO2 96 % 03/14/21 1045  Vitals shown include unvalidated device data.  Last Pain:  Vitals:   03/14/21 0725  TempSrc:   PainSc: 0-No pain         Complications: No complications documented.

## 2021-03-14 NOTE — Interval H&P Note (Signed)
History and Physical Interval Note:  03/14/2021 7:39 AM  Angel Khan  has presented today for surgery, with the diagnosis of Carotid Artery Stenosis.  The various methods of treatment have been discussed with the patient and family. After consideration of risks, benefits and other options for treatment, the patient has consented to  Procedure(s): LEFT CAROTID ARTERY ENDARTERECTOMY (Left) as a surgical intervention.  The patient's history has been reviewed, patient examined, no change in status, stable for surgery.  I have reviewed the patient's chart and labs.  Questions were answered to the patient's satisfaction.     Annamarie Major

## 2021-03-14 NOTE — Discharge Instructions (Signed)
   [facility] [place]  Discharge Instructions   Carotid Surgery  Please refer to the following instructions for your post-procedure care. Your surgeon or physician assistant will discuss any changes with you.  Activity  You are encouraged to walk as much as you can. You can slowly return to normal activities but must avoid strenuous activity and heavy lifting until your doctor tell you it's okay. Avoid activities such as vacuuming or swinging a golf club. You can drive after one week if you are comfortable and you are no longer taking prescription pain medications. It is normal to feel tired for serval weeks after your surgery. It is also normal to have difficulty with sleep habits, eating, and bowel movements after surgery. These will go away with time.  Bathing/Showering  Shower daily after you go home. Do not soak in a bathtub, hot tub, or swim until the incision heals completely.  Incision Care  Shower every day. Clean your incision with mild soap and water. Pat the area dry with a clean towel. You do not need a bandage unless otherwise instructed. Do not apply any ointments or creams to your incision. You may have skin glue on your incision. Do not peel it off. It will come off on its own in about one week. Your incision may feel thickened and raised for several weeks after your surgery. This is normal and the skin will soften over time.   For Men Only: It's okay to shave around the incision but do not shave the incision itself for 2 weeks. It is common to have numbness under your chin that could last for several months.  Diet  Resume your normal diet. There are no special food restrictions following this procedure. A low fat/low cholesterol diet is recommended for all patients with vascular disease. In order to heal from your surgery, it is CRITICAL to get adequate nutrition. Your body requires vitamins, minerals, and protein. Vegetables are the best source of  vitamins and minerals. Vegetables also provide the perfect balance of protein. Processed food has little nutritional value, so try to avoid this.  Medications  Resume taking all of your medications unless your doctor or physician assistant tells you not to. If your incision is causing pain, you may take over-the- counter pain relievers such as acetaminophen (Tylenol). If you were prescribed a stronger pain medication, please be aware these medications can cause nausea and constipation. Prevent nausea by taking the medication with a snack or meal. Avoid constipation by drinking plenty of fluids and eating foods with a high amount of fiber, such as fruits, vegetables, and grains.   Do not take Tylenol if you are taking prescription pain medications.  Follow Up  Our office will schedule a follow up appointment 2-3 weeks following discharge.  Please call us immediately for any of the following conditions  . Increased pain, redness, drainage (pus) from your incision site. . Fever of 101 degrees or higher. . If you should develop stroke (slurred speech, difficulty swallowing, weakness on one side of your body, loss of vision) you should call 911 and go to the nearest emergency room. .  Reduce your risk of vascular disease:  . Stop smoking. If you would like help call [facility] at 1-800-QUIT-NOW (1-800-784-8669) or [place] at 336-586-4000. . Manage your cholesterol . Maintain a desired weight . Control your diabetes . Keep your blood pressure down .  If you have any questions, please call the office at [phone]. 

## 2021-03-14 NOTE — Anesthesia Procedure Notes (Signed)
Procedure Name: Intubation Date/Time: 03/14/2021 8:34 AM Performed by: Clearnce Sorrel, CRNA Pre-anesthesia Checklist: Patient identified, Emergency Drugs available, Suction available, Patient being monitored and Timeout performed Patient Re-evaluated:Patient Re-evaluated prior to induction Oxygen Delivery Method: Circle system utilized Preoxygenation: Pre-oxygenation with 100% oxygen Induction Type: IV induction Ventilation: Mask ventilation without difficulty Laryngoscope Size: Mac and 3 Grade View: Grade I Tube type: Oral Tube size: 7.5 mm Number of attempts: 1 Airway Equipment and Method: Stylet Placement Confirmation: ETT inserted through vocal cords under direct vision,  positive ETCO2 and breath sounds checked- equal and bilateral Secured at: 23 cm Tube secured with: Tape Dental Injury: Teeth and Oropharynx as per pre-operative assessment

## 2021-03-14 NOTE — Anesthesia Procedure Notes (Signed)
Arterial Line Insertion Start/End3/16/2022 8:00 AM, 03/14/2021 8:05 AM Performed by: Renato Shin, CRNA  Patient location: Pre-op. Preanesthetic checklist: patient identified, IV checked, site marked, risks and benefits discussed, surgical consent, monitors and equipment checked, pre-op evaluation, timeout performed and anesthesia consent Lidocaine 1% used for infiltration Right, radial was placed Catheter size: 20 Fr Hand hygiene performed  and maximum sterile barriers used   Attempts: 1 Procedure performed without using ultrasound guided technique. Following insertion, dressing applied. Post procedure assessment: normal and unchanged

## 2021-03-14 NOTE — Progress Notes (Signed)
  Progress Note    03/14/2021 4:00 PM Day of Surgery  Subjective:  No complaints except that his condom catheter was not working. Says minimal surgical site pain. RN notified me that he had been having some PVS with bigeminy. Asymptomatic   Vitals:   03/14/21 1146 03/14/21 1200  BP: 106/61 114/68  Pulse: 87 87  Resp:  18  Temp: 97.7 F (36.5 C) 97.7 F (36.5 C)  SpO2: 98%    Physical Exam: Cardiac: regular rate and rhythm Lungs: non labored Incisions:  Left neck incision is clean dry and intact. No swelling and hematoma Extremities: moving all extremities without deficits Neurologic: alert and oriented. CN intact. Tongue midline. Smile symmetric. Speech coherent  CBC    Component Value Date/Time   WBC 12.2 (H) 03/14/2021 1230   RBC 4.31 03/14/2021 1230   HGB 13.7 03/14/2021 1230   HCT 40.4 03/14/2021 1230   PLT 253 03/14/2021 1230   MCV 93.7 03/14/2021 1230   MCH 31.8 03/14/2021 1230   MCHC 33.9 03/14/2021 1230   RDW 14.3 03/14/2021 1230   LYMPHSABS 3.8 09/08/2019 1710   MONOABS 1.1 (H) 09/08/2019 1710   EOSABS 0.4 09/08/2019 1710   BASOSABS 0.1 09/08/2019 1710    BMET    Component Value Date/Time   NA 138 03/08/2021 0844   K 4.4 03/08/2021 0844   CL 106 03/08/2021 0844   CO2 23 03/08/2021 0844   GLUCOSE 98 03/08/2021 0844   BUN 21 03/08/2021 0844   CREATININE 1.18 03/14/2021 1230   CALCIUM 9.5 03/08/2021 0844   GFRNONAA >60 03/14/2021 1230    INR    Component Value Date/Time   INR 1.0 03/08/2021 0844     Intake/Output Summary (Last 24 hours) at 03/14/2021 1600 Last data filed at 03/14/2021 1100 Gross per 24 hour  Intake 900 ml  Output 25 ml  Net 875 ml     Assessment/Plan:  80 y.o. male is s/p left CEA Day of Surgery. Doing well post op. Neurologically intact. Having some PVS. Asymptomatic. Continue to monitor. Anticipate discharge tomorrow if he does well overnight    Karoline Caldwell, [place] [facility] [phone] 03/14/2021 4:00 PM

## 2021-03-14 NOTE — Progress Notes (Signed)
Patient having PVCs with bigeminy. PA aware. Patient is asymptomatic and VS are stable.Fuller Canada, RN

## 2021-03-15 ENCOUNTER — Encounter (HOSPITAL_COMMUNITY): Payer: Self-pay | Admitting: Surgery

## 2021-03-15 LAB — CBC
HCT: 40.9 % (ref 39.0–52.0)
Hemoglobin: 13.3 g/dL (ref 13.0–17.0)
MCH: 31.4 pg (ref 26.0–34.0)
MCHC: 32.5 g/dL (ref 30.0–36.0)
MCV: 96.7 fL (ref 80.0–100.0)
Platelets: 256 10*3/uL (ref 150–400)
RBC: 4.23 MIL/uL (ref 4.22–5.81)
RDW: 14.4 % (ref 11.5–15.5)
WBC: 15.4 10*3/uL — ABNORMAL HIGH (ref 4.0–10.5)
nRBC: 0 % (ref 0.0–0.2)

## 2021-03-15 LAB — LIPID PANEL
Cholesterol: 186 mg/dL (ref 0–200)
HDL: 40 mg/dL — ABNORMAL LOW (ref >40)
LDL Cholesterol: 115 mg/dL — ABNORMAL HIGH (ref 0–99)
Total CHOL/HDL Ratio: 4.7 RATIO
Triglycerides: 154 mg/dL — ABNORMAL HIGH (ref <150)
VLDL: 31 mg/dL (ref 0–40)

## 2021-03-15 LAB — BASIC METABOLIC PANEL
Anion gap: 7 (ref 5–15)
BUN: 20 mg/dL (ref 8–23)
CO2: 25 mmol/L (ref 22–32)
Calcium: 8.4 mg/dL — ABNORMAL LOW (ref 8.9–10.3)
Chloride: 105 mmol/L (ref 98–111)
Creatinine, Ser: 1.16 mg/dL (ref 0.61–1.24)
GFR, Estimated: >60 mL/min (ref >60)
Glucose, Bld: 131 mg/dL — ABNORMAL HIGH (ref 70–99)
Potassium: 4 mmol/L (ref 3.5–5.1)
Sodium: 137 mmol/L (ref 135–145)

## 2021-03-15 MED ORDER — ROSUVASTATIN CALCIUM 5 MG PO TABS: 5.0000 mg | ORAL_TABLET | Freq: Every day | ORAL | 11 refills | Status: DC

## 2021-03-15 MED ORDER — OXYCODONE-ACETAMINOPHEN 5-325 MG PO TABS
1.0000 | ORAL_TABLET | Freq: Four times a day (QID) | ORAL | 0 refills | Status: DC | PRN
Start: 2021-03-15 — End: 2021-04-27

## 2021-03-15 NOTE — Progress Notes (Signed)
Discharge instructions (including medications) discussed with and copy provided to patient/caregiver 

## 2021-03-15 NOTE — Care Management Important Message (Deleted)
Important Message  Patient Details  Name: Angel Khan MRN: [id number] Date of Birth: Dec 12, 1941   Medicare Important Message Given:  Yes     Shelda Altes 03/15/2021, 9:25 AM

## 2021-03-15 NOTE — Anesthesia Postprocedure Evaluation (Signed)
Anesthesia Post Note  Patient: Angel Khan  Procedure(s) Performed: LEFT CAROTID ARTERY ENDARTERECTOMY (Left )     Patient location during evaluation: PACU Anesthesia Type: General Level of consciousness: awake and alert Pain management: pain level controlled Vital Signs Assessment: post-procedure vital signs reviewed and stable Respiratory status: spontaneous breathing, nonlabored ventilation, respiratory function stable and patient connected to nasal cannula oxygen Cardiovascular status: blood pressure returned to baseline and stable Postop Assessment: no apparent nausea or vomiting Anesthetic complications: no   No complications documented.  Last Vitals:  Vitals:   03/15/21 0400 03/15/21 0804  BP: 112/67 122/70  Pulse: 76 77  Resp: 20 18  Temp: 36.9 C 36.9 C  SpO2: 99% 96%    Last Pain:  Vitals:   03/15/21 0804  TempSrc: Oral  PainSc: 0-No pain                 Catalina Gravel

## 2021-03-15 NOTE — Progress Notes (Signed)
[facility] of   Subjective  - doing well without complaints other than sore throat.   Objective 112/67 76 98.5 F (36.9 C) (Oral) 20 99%  Intake/Output Summary (Last 24 hours) at 03/15/2021 3009 Last data filed at 03/15/2021 0500 Gross per 24 hour  Intake 1785 ml  Output 625 ml  Net 1160 ml    Left neck incision healing well without frank hematoma. Speech is clear Moving all 4 ext.   No tongue deviation, facial droop on left  Assessment/Planning: POD # 1 left CEA by Dr. Edmon Crape with facial droop on the left will likely be temporary He is tolerating PO's with mod throat soreness.   Labs are WNL Plan for discharge home and f/u in 2-3 weeks  Roxy Horseman 03/15/2021 7:22 AM --  Laboratory Lab Results: Recent Labs    03/14/21 1230 03/15/21 0415  WBC 12.2* 15.4*  HGB 13.7 13.3  HCT 40.4 40.9  PLT 253 256   BMET Recent Labs    03/14/21 1230 03/15/21 0415  NA  --  137  K  --  4.0  CL  --  105  CO2  --  25  GLUCOSE  --  131*  BUN  --  20  CREATININE 1.18 1.16  CALCIUM  --  8.4*    COAG Lab Results  Component Value Date   INR 1.0 03/08/2021   No results found for: PTT

## 2021-03-16 NOTE — Discharge Summary (Addendum)
[facility] Discharge Summary   Patient ID:  Angel Khan MRN: [id number] DOB/AGE: 1941/03/12 80 y.o.  Admit date: 03/14/2021 Discharge date: 03/15/21 Date of Surgery: 03/14/2021 Surgeon: Surgeon(s): Serafina Mitchell, MD  Admission Diagnosis: Asymptomatic carotid artery stenosis without infarction, right [I65.21]  Discharge Diagnoses:  Asymptomatic carotid artery stenosis without infarction, right [I65.21]  Secondary Diagnoses: Past Medical History:  Diagnosis Date  . Adenomatous colon polyp   . Barrett esophagus   . CAD (coronary artery disease)   . Chronic headaches   . Complication of anesthesia    Hard to wake up  . COPD (chronic obstructive pulmonary disease) ([place])   . Coronary atherosclerosis   . Diverticulitis of colon December 2012   Diagnosed by CT scan [facility]  . DM (diabetes mellitus) ([place])   . Dyspnea   . GERD (gastroesophageal reflux disease)   . Hemorrhoids   . History of kidney stones   . HLD (hyperlipidemia)   . Horseshoe kidney   . HTN (hypertension)   . Melanoma ([place])   . Poor short term memory   . Sleep apnea    Can not use a cpap  . Tracheobronchomalacia     Procedure(s): LEFT CAROTID ARTERY ENDARTERECTOMY  Discharged Condition: stable  HPI: This is a 80 year old gentleman who was found to have asymptomatic left carotid stenosis by CT scan.  He comes in today for carotid endarterectomy.   Hospital Course:  Angel Khan is a 80 y.o. male is S/P Procedure(s): LEFT CAROTID ARTERY ENDARTERECTOMY Uneventful stay over night He does have left facial droop, no tongue deviation.  Moving all ext. Left neck incision healing well without hematoma. Stable for discharge home.    Significant Diagnostic Studies: CBC Lab Results  Component Value Date   WBC 15.4 (H) 03/15/2021   HGB 13.3 03/15/2021   HCT 40.9 03/15/2021   MCV 96.7 03/15/2021   PLT 256 03/15/2021    BMET    Component Value  Date/Time   NA 137 03/15/2021 0415   K 4.0 03/15/2021 0415   CL 105 03/15/2021 0415   CO2 25 03/15/2021 0415   GLUCOSE 131 (H) 03/15/2021 0415   BUN 20 03/15/2021 0415   CREATININE 1.16 03/15/2021 0415   CALCIUM 8.4 (L) 03/15/2021 0415   GFRNONAA >60 03/15/2021 0415   COAG Lab Results  Component Value Date   INR 1.0 03/08/2021     Disposition:  Discharge to :Home Discharge Instructions    Call MD for:  redness, tenderness, or signs of infection (pain, swelling, bleeding, redness, odor or green/yellow discharge around incision site)   Complete by: As directed    Call MD for:  severe or increased pain, loss or decreased feeling  in affected limb(s)   Complete by: As directed    Call MD for:  temperature >100.5   Complete by: As directed    Resume previous diet   Complete by: As directed      Allergies as of 03/15/2021      Reactions   Depakote [divalproex Sodium]    Hair loss   Magnesium    liquid   Other Other (See Comments)   Hair loss, rash Liquid Magnesium   Propranolol Other (See Comments)   Muscle cramps   Statins Other (See Comments)   Muscle cramps      Medication List    TAKE these medications   aspirin 325 MG EC tablet Take 325 mg by mouth at bedtime.  cloNIDine 0.1 MG tablet Commonly known as: CATAPRES Take 0.1 mg by mouth daily.   clopidogrel 75 MG tablet Commonly known as: PLAVIX Take 75 mg by mouth at bedtime.   dicyclomine 20 MG tablet Commonly known as: BENTYL Take 1 tablet (20 mg total) by mouth 2 (two) times daily.   ezetimibe 10 MG tablet Commonly known as: ZETIA Take 10 mg by mouth every morning.   famotidine 20 MG tablet Commonly known as: PEPCID Take 20 mg by mouth daily.   Flutter Devi by Does not apply route as directed.   HYDROcodone-acetaminophen 7.5-325 MG tablet Commonly known as: NORCO Take 1 tablet by mouth 2 (two) times daily as needed for severe pain.   ipratropium 0.06 % nasal spray Commonly known as:  ATROVENT Place 2 sprays into both nostrils 3 (three) times daily as needed for rhinitis (runny nose).   isosorbide mononitrate 60 MG 24 hr tablet Commonly known as: IMDUR Take 60 mg by mouth at bedtime.   losartan 100 MG tablet Commonly known as: COZAAR Take 100 mg by mouth daily.   MAGNESIUM PO Take 400 mg by mouth daily.   montelukast 10 MG tablet Commonly known as: SINGULAIR Take 1 tablet (10 mg total) by mouth at bedtime.   oxyCODONE-acetaminophen 5-325 MG tablet Commonly known as: PERCOCET/ROXICET Take 1 tablet by mouth every 6 (six) hours as needed for moderate pain.   pantoprazole 40 MG tablet Commonly known as: PROTONIX Take 1 tablet (40 mg total) by mouth daily.   Potassium 99 MG Tabs Take 99 mg by mouth daily.   rosuvastatin 5 MG tablet Commonly known as: Crestor Take 1 tablet (5 mg total) by mouth daily.   sertraline 50 MG tablet Commonly known as: ZOLOFT Take 50 mg by mouth daily.   Vitamin A 2400 MCG (8000 UT) Caps Take 8,000 Units by mouth daily.   vitamin C with rose hips 500 MG tablet Take 500 mg by mouth daily.   Vitamin D 50 MCG (2000 UT) Caps Take 2,000 Units by mouth daily.      Verbal and written Discharge instructions given to the patient. Wound care per Discharge AVS  Follow-up Information    Serafina Mitchell, MD Follow up in 2 week(s).   Specialties: Vascular Surgery, Cardiology Contact information: [address] [place] [address] [phone]               Signed: Roxy Horseman 03/16/2021, 7:53 AM --- For VQI Registry use --- Instructions: Press F2 to tab through selections.  Delete question if not applicable.   Modified Rankin score at D/C (0-6): Rankin Score=0  IV medication needed for:  1. Hypertension: No 2. Hypotension: No  Post-op Complications: No  1. Post-op CVA or TIA: No  If yes: Event classification (right eye, left eye, right cortical, left cortical, verterobasilar, other):   If yes: Timing  of event (intra-op, <6 hrs post-op, >=6 hrs post-op, unknown):   2. CN injury: No  If yes: CN  injuried   3. Myocardial infarction: No  If yes: Dx by (EKG or clinical, Troponin):   4.  CHF: No  5.  Dysrhythmia (new): No  6. Wound infection: No  7. Reperfusion symptoms: No  8. Return to OR: No  If yes: return to OR for (bleeding, neurologic, other CEA incision, other):   Discharge medications: Statin use:  Yes ASA use:  Yes Beta blocker use:  No  for medical reason   ACE-Inhibitor use:  No  for medical reason   P2Y12 Antagonist use: [ ]  None, [x ] Plavix, [ ]  Plasugrel, [ ]  Ticlopinine, [ ]  Ticagrelor, [ ]  Other, [ ]  No for medical reason, [ ]  Non-compliant, [ ]  Not-indicated Anti-coagulant use:  [ x] None, [ ]  Warfarin, [ ]  Rivaroxaban, [ ]  Dabigatran, [ ]  Other, [ ]  No for medical reason, [ ]  Non-compliant, [ ]  Not-indicated

## 2021-04-02 ENCOUNTER — Ambulatory Visit (INDEPENDENT_AMBULATORY_CARE_PROVIDER_SITE_OTHER): Payer: Medicare Other | Admitting: Physician Assistant

## 2021-04-02 ENCOUNTER — Other Ambulatory Visit: Payer: Self-pay

## 2021-04-02 VITALS — BP 111/71 | HR 100 | Temp 98.4°F | Resp 20 | Ht 65.0 in | Wt 181.7 lb

## 2021-04-02 DIAGNOSIS — I6523 Occlusion and stenosis of bilateral carotid arteries: Secondary | ICD-10-CM

## 2021-04-02 NOTE — Progress Notes (Signed)
POST OPERATIVE OFFICE NOTE    CC:  F/u for surgery  HPI:  This is a 80 y.o. male who is s/p left CEA on 03/14/21 by Dr. Trula Slade for asymptomatic left ICA stenosis.  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  Pt returns today for follow up.  Pt states he has a left side facial droop and he occasionally bites his lip.  He also complains of left dorsal thumb numbness and pain.  He states he sustained this after IV attempts.  He denise symptoms of stroke/TIA  Allergies  Allergen Reactions  . Depakote [Divalproex Sodium]     Hair loss   . Magnesium     liquid  . Other Other (See Comments)    Hair loss, rash Liquid Magnesium  . Propranolol Other (See Comments)    Muscle cramps  . Statins Other (See Comments)    Muscle cramps    Current Outpatient Medications  Medication Sig Dispense Refill  . Ascorbic Acid (VITAMIN C WITH ROSE HIPS) 500 MG tablet Take 500 mg by mouth daily.    [name] aspirin 325 MG EC tablet Take 325 mg by mouth at bedtime.     . Cholecalciferol (VITAMIN D) 50 MCG (2000 UT) CAPS Take 2,000 Units by mouth daily.    . cloNIDine (CATAPRES) 0.1 MG tablet Take 0.1 mg by mouth daily.    . clopidogrel (PLAVIX) 75 MG tablet Take 75 mg by mouth at bedtime.    . dicyclomine (BENTYL) 20 MG tablet Take 1 tablet (20 mg total) by mouth 2 (two) times daily. 60 tablet 11  . ergocalciferol (VITAMIN D2) 1.25 MG (50000 UT) capsule Take by mouth.    . ezetimibe (ZETIA) 10 MG tablet Take 10 mg by mouth every morning.    . famotidine (PEPCID) 20 MG tablet Take 20 mg by mouth daily.    [name] HYDROcodone-acetaminophen (NORCO) 7.5-325 MG tablet Take 1 tablet by mouth 2 (two) times daily as needed for severe pain.    [name] ipratropium (ATROVENT) 0.06 % nasal spray Place 2 sprays into both nostrils 3 (three) times daily as needed for rhinitis (runny nose). 15 mL 5  . isosorbide mononitrate (IMDUR) 60 MG 24 hr tablet Take 60 mg by mouth at bedtime.    [name] losartan (COZAAR) 100 MG tablet Take 100 mg by mouth daily.     [name] MAGNESIUM PO Take 400 mg by mouth daily.    . montelukast (SINGULAIR) 10 MG tablet Take 1 tablet (10 mg total) by mouth at bedtime. 30 tablet 11  . oxyCODONE-acetaminophen (PERCOCET/ROXICET) 5-325 MG tablet Take 1 tablet by mouth every 6 (six) hours as needed for moderate pain. 12 tablet 0  . pantoprazole (PROTONIX) 40 MG tablet Take 1 tablet (40 mg total) by mouth daily. 90 tablet   . Potassium 99 MG TABS Take 99 mg by mouth daily.    [name] Respiratory Therapy Supplies (FLUTTER) DEVI by Does not apply route as directed.    . rosuvastatin (CRESTOR) 5 MG tablet Take 1 tablet (5 mg total) by mouth daily. 30 tablet 11  . sertraline (ZOLOFT) 50 MG tablet Take 50 mg by mouth daily.    . Vitamin A 2400 MCG (8000 UT) CAPS Take 8,000 Units by mouth daily.     No current facility-administered medications for this visit.     ROS:  See HPI  Physical Exam:    Incision:  Well healed without hematoma Extremities:  Palpable radial pulse, moving all extremities Neuro: left facial droop,  no tongue deviation    Assessment/Plan:  This is a 80 y.o. male who is s/p:left CEA 03/14/21 by Dr. Trula Slade  He has a facial droop that will likely recover over 3-6 months time.   He has 50% stenosis on the right ICA  He will f/u in 9 month for carotid duplex    Roxy Horseman PA-C [facility] [phone]   Clinic MD:  Trula Slade

## 2021-04-03 ENCOUNTER — Other Ambulatory Visit: Payer: Self-pay

## 2021-04-03 DIAGNOSIS — I6523 Occlusion and stenosis of bilateral carotid arteries: Secondary | ICD-10-CM

## 2021-04-05 ENCOUNTER — Other Ambulatory Visit: Payer: Medicare Other

## 2021-04-05 ENCOUNTER — Ambulatory Visit (INDEPENDENT_AMBULATORY_CARE_PROVIDER_SITE_OTHER): Payer: Medicare Other | Admitting: Internal Medicine

## 2021-04-05 ENCOUNTER — Encounter: Payer: Self-pay | Admitting: Internal Medicine

## 2021-04-05 VITALS — BP 104/68 | HR 88 | Ht 65.0 in | Wt 182.0 lb

## 2021-04-05 DIAGNOSIS — Z86010 Personal history of colonic polyps: Secondary | ICD-10-CM | POA: Diagnosis not present

## 2021-04-05 DIAGNOSIS — R197 Diarrhea, unspecified: Secondary | ICD-10-CM

## 2021-04-05 DIAGNOSIS — K2270 Barrett's esophagus without dysplasia: Secondary | ICD-10-CM

## 2021-04-05 DIAGNOSIS — Z7902 Long term (current) use of antithrombotics/antiplatelets: Secondary | ICD-10-CM

## 2021-04-05 DIAGNOSIS — I6523 Occlusion and stenosis of bilateral carotid arteries: Secondary | ICD-10-CM

## 2021-04-05 MED ORDER — DICYCLOMINE HCL 20 MG PO TABS: 20.0000 mg | ORAL_TABLET | Freq: Three times a day (TID) | ORAL | 5 refills | Status: DC

## 2021-04-05 NOTE — Patient Instructions (Signed)
You have been scheduled for an endoscopy and colonoscopy. Please follow the written instructions given to you at your visit today. Please pick up your prep supplies at the pharmacy within the next 1-3 days. If you use inhalers (even only as needed), please bring them with you on the day of your procedure.   You will be contacted by our office prior to your procedure for directions on holding your Plavix.  If you do not hear from our office 1 week prior to your scheduled procedure, please call [phone] to discuss.   We have sent the following medications to your pharmacy for you to pick up at your convenience: [place] provider has requested that you go to the basement level for lab work before leaving today. Press "B" on the elevator. The lab is located at the first door on the left as you exit the elevator.  Due to recent changes in healthcare laws, you may see the results of your imaging and laboratory studies on MyChart before your provider has had a chance to review them.  We understand that in some cases there may be results that are confusing or concerning to you. Not all laboratory results come back in the same time frame and the provider may be waiting for multiple results in order to interpret others.  Please give [place] 48 hours in order for your provider to thoroughly review all the results before contacting the office for clarification of your results.   I appreciate the opportunity to care for you. Silvano Rusk, MD, [facility]

## 2021-04-05 NOTE — Progress Notes (Signed)
Angel Khan DAY 80 y.o. 02-09-1941 [id number]  Assessment & Plan:   Encounter Diagnoses  Name Primary?  . Barrett's esophagus without dysplasia Yes  . Watery diarrhea   . Long term current use of antithrombotics/antiplatelets - clopidogrel   . 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  He is worried and is inclined to have a colonoscopy.  It can be difficult to know when something is IBS again or another problem so we will proceed with a colonoscopy and 1 more Barrett's surveillance as well.  Procedures will be performed at the hospital due to his comorbidities including tracheomalacia.  He will be holding his clopidogrel 5 days prior to the procedure we will clarify with cardiology on that.  I explained there is a rare but real risk of coronary event like myocardial infarction when off that medication we will continue his 325 mg aspirin.  The risks and benefits as well as alternatives of endoscopic procedure(s) have been discussed and reviewed. All questions answered. The patient agrees to proceed.  Fecal calprotectin will be done to try to understand if any of this is inflammatory. I have recommended he take his dicyclomine 3 times daily before meals  Orders Placed This Encounter  Procedures  . Procedural/ Surgical Case Request: COLONOSCOPY WITH PROPOFOL, ESOPHAGOGASTRODUODENOSCOPY (EGD) WITH PROPOFOL  . Calprotectin, Fecal  . Ambulatory referral to Gastroenterology   Meds ordered this encounter  Medications  . dicyclomine (BENTYL) 20 MG tablet    Sig: Take 1 tablet (20 mg total) by mouth 3 (three) times daily before meals.    Dispense:  90 tablet    Refill:  5     I appreciate the opportunity to care for this patient. 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  He had seen Ellouise Newer in January and it was thought that his IBS was flaring and it was recommended he take his dicyclomine 20 mg twice daily, which she had somehow stopped.  He is back on that but still complains of watery stools anywhere from 1 or 2-6 times a day.  They tend to be postprandial.  It sounds like he is taking his dicyclomine at least once a day maybe twice a day.  He does not feel like he is any better.  He is worried that he could have colon cancer he has a nephew with stage IV colon cancer right now.  No heartburn or dysphagia complaints.  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  He underwent a successful carotid endarterectomy in March.  He is on a full dose aspirin and he is on Plavix for history of percutaneous coronary intervention.   Wt Readings from Last 3 Encounters:  04/05/21 182 lb (82.6 kg)  04/02/21 181 lb 11.2 oz (82.4 kg)  03/14/21 184 lb (83.5 kg)   Stable x 1 year  Allergies  Allergen Reactions  . Depakote [Divalproex Sodium]     Hair loss   . Magnesium     liquid  . Other Other (See Comments)    Hair loss, rash Liquid Magnesium  . Propranolol Other (See Comments)    Muscle cramps  . Statins Other (See Comments)    Muscle cramps   Current Meds  Medication Sig  . Ascorbic  Acid (VITAMIN C WITH ROSE HIPS) 500 MG tablet Take 500 mg by mouth daily.  [name] aspirin 325 MG EC tablet Take 325 mg by mouth at bedtime.   . Cholecalciferol (VITAMIN D) 50 MCG (2000 UT) CAPS Take 2,000 Units by mouth daily.  . cloNIDine (CATAPRES) 0.1 MG tablet Take 0.1 mg by mouth daily.  . clopidogrel (PLAVIX) 75 MG tablet Take 75 mg by mouth at bedtime.  . dicyclomine (BENTYL) 20 MG tablet Take 1 tablet (20 mg total) by mouth 2 (two) times daily.  . ergocalciferol (VITAMIN D2) 1.25 MG (50000 UT) capsule Take by mouth.  . ezetimibe (ZETIA) 10 MG tablet Take 10 mg by mouth every morning.  . famotidine (PEPCID) 20 MG tablet Take 20 mg by mouth  daily.  [name] HYDROcodone-acetaminophen (NORCO) 7.5-325 MG tablet Take 1 tablet by mouth 2 (two) times daily as needed for severe pain.  [name] ipratropium (ATROVENT) 0.06 % nasal spray Place 2 sprays into both nostrils 3 (three) times daily as needed for rhinitis (runny nose). (Patient taking differently: Place 2 sprays into both nostrils as needed for rhinitis (runny nose).)  . isosorbide mononitrate (IMDUR) 60 MG 24 hr tablet Take 60 mg by mouth at bedtime.  [name] losartan (COZAAR) 100 MG tablet Take 100 mg by mouth daily.  [name] MAGNESIUM PO Take 400 mg by mouth daily.  . montelukast (SINGULAIR) 10 MG tablet Take 1 tablet (10 mg total) by mouth at bedtime.  [name] oxyCODONE-acetaminophen (PERCOCET/ROXICET) 5-325 MG tablet Take 1 tablet by mouth every 6 (six) hours as needed for moderate pain.  . pantoprazole (PROTONIX) 40 MG tablet Take 1 tablet (40 mg total) by mouth daily.  . Potassium 99 MG TABS Take 99 mg by mouth daily.  [name] Respiratory Therapy Supplies (FLUTTER) DEVI by Does not apply route as directed.  . rosuvastatin (CRESTOR) 5 MG tablet Take 1 tablet (5 mg total) by mouth daily.  . sertraline (ZOLOFT) 50 MG tablet Take 50 mg by mouth daily.  . Vitamin A 2400 MCG (8000 UT) CAPS Take 8,000 Units by mouth daily.   Past Medical History:  Diagnosis Date  . Adenomatous colon polyp   . Barrett esophagus   . CAD (coronary artery disease)   . Chronic headaches   . Complication of anesthesia    Hard to wake up  . COPD (chronic obstructive pulmonary disease) ([place])   . Coronary atherosclerosis   . Diverticulitis of colon December 2012   Diagnosed by CT scan [facility]  . DM (diabetes mellitus) ([place])   . Dyspnea   . GERD (gastroesophageal reflux disease)   . Hemorrhoids   . History of kidney stones   . HLD (hyperlipidemia)   . Horseshoe kidney   . HTN (hypertension)   . Melanoma ([place])   . Poor short term memory   . Sleep apnea    Can not use a cpap  . Tracheobronchomalacia    Past  Surgical History:  Procedure Laterality Date  . BRAIN SURGERY     Aneurysm 2004  . CARDIAC CATHETERIZATION  01/01/2013  . CHOLECYSTECTOMY  01/12/2013   Dr. Gerlene Fee  . COLONOSCOPY W/ BIOPSIES AND POLYPECTOMY  09/08/2007   adenomatous polyps, diverticulosis, external hemorrhoids  . CORONARY ARTERY BYPASS GRAFT  07/11/2009   off-pump CABG: LIMA-LAD 07/11/09; occluded graft 09/07/09, s/p DES LAD just after DIAG  . CORONARY STENT PLACEMENT     x 5  . ENDARTERECTOMY Left 03/14/2021   Procedure: LEFT CAROTID ARTERY ENDARTERECTOMY;  Surgeon: Serafina Mitchell, MD;  Location: [facility] OR;  Service: Vascular;  Laterality: Left;  . HERNIA REPAIR     abdominal  . LEFT CAROTID ARTERY ENDARTERECTOMY   03/14/2021  . TONSILLECTOMY    . UMBILICAL HERNIA REPAIR    . UPPER GASTROINTESTINAL ENDOSCOPY  10/12/2010   barrett's, fondic gland polyps, duodenitis   Social History   Social History Narrative   Married has 3 children he is retired   Former smoker, no alcohol tobacco or drug use now   family history includes Colitis in an other family member; Diabetes in his mother; Heart disease in his brother, father, and mother; Rheumatic fever in his sister.   Review of Systems As per HPI  Objective:   Physical Exam BP 104/68   Pulse 88   Ht 5\' 5"  (1.651 m)   Wt 182 lb (82.6 kg)   SpO2 95%   BMI 30.29 kg/m  Lungs cta Ht S1s2 no rmg abd small umbilical hernia - above

## 2021-04-06 ENCOUNTER — Telehealth: Payer: Self-pay

## 2021-04-06 NOTE — Telephone Encounter (Signed)
   Angel Khan 08-27-1941 [id number]  Dear Dr Valetta Fuller,:  We have scheduled the above named patient for a(n) Colonoscopy/EGD  procedure. Our records show that (s)he is on anticoagulation therapy.  Please advise as to whether the patient may come off their therapy of Plavix  5 days prior to their procedure which is scheduled for 05/07/2021.  Please route your response to Brittanya Winburn [place], [place] or fax response to [phone]  Sincerely,    [facility]   Note faxed to Dr Valetta Fuller, # [phone]

## 2021-04-10 ENCOUNTER — Other Ambulatory Visit: Payer: Medicare Other

## 2021-04-10 DIAGNOSIS — R197 Diarrhea, unspecified: Secondary | ICD-10-CM

## 2021-04-12 LAB — CALPROTECTIN, FECAL: Calprotectin, Fecal: 64 ug/g (ref 0–120)

## 2021-04-18 NOTE — Telephone Encounter (Signed)
Per Dr. Valetta Fuller ok to hold Plavix for 5 days prior to procedure. Pt has been informed and voiced understanding. Letter has been sent to scanning.

## 2021-05-02 ENCOUNTER — Other Ambulatory Visit: Payer: Self-pay

## 2021-05-03 ENCOUNTER — Other Ambulatory Visit (HOSPITAL_COMMUNITY)
Admission: RE | Admit: 2021-05-03 | Discharge: 2021-05-03 | Disposition: A | Discharge status: Home / Self Care | Payer: Medicare Other | Source: Ambulatory Visit | Attending: Internal Medicine | Admitting: Internal Medicine

## 2021-05-03 DIAGNOSIS — Z20822 Contact with and (suspected) exposure to covid-19: Secondary | ICD-10-CM | POA: Diagnosis not present

## 2021-05-03 DIAGNOSIS — Z01812 Encounter for preprocedural laboratory examination: Secondary | ICD-10-CM | POA: Diagnosis present

## 2021-05-04 LAB — SARS CORONAVIRUS 2 (TAT 6-24 HRS): SARS Coronavirus 2: NEGATIVE

## 2021-05-07 ENCOUNTER — Ambulatory Visit (HOSPITAL_COMMUNITY): Payer: Medicare Other | Admitting: Certified Registered Nurse Anesthetist

## 2021-05-07 ENCOUNTER — Encounter (HOSPITAL_COMMUNITY): Payer: Self-pay | Admitting: Internal Medicine

## 2021-05-07 ENCOUNTER — Other Ambulatory Visit: Payer: Self-pay

## 2021-05-07 ENCOUNTER — Encounter (HOSPITAL_COMMUNITY)
Admission: RE | Disposition: A | Discharge status: Home / Self Care | Payer: Self-pay | Source: Home / Self Care | Attending: Internal Medicine

## 2021-05-07 ENCOUNTER — Ambulatory Visit (HOSPITAL_COMMUNITY)
Admission: RE | Admit: 2021-05-07 | Discharge: 2021-05-07 | Disposition: A | Discharge status: Home / Self Care | Payer: Medicare Other | Attending: Internal Medicine | Admitting: Internal Medicine

## 2021-05-07 DIAGNOSIS — Z888 Allergy status to other drugs, medicaments and biological substances status: Secondary | ICD-10-CM | POA: Diagnosis not present

## 2021-05-07 DIAGNOSIS — Z79899 Other long term (current) drug therapy: Secondary | ICD-10-CM | POA: Diagnosis not present

## 2021-05-07 DIAGNOSIS — Z7982 Long term (current) use of aspirin: Secondary | ICD-10-CM | POA: Insufficient documentation

## 2021-05-07 DIAGNOSIS — K649 Unspecified hemorrhoids: Secondary | ICD-10-CM | POA: Insufficient documentation

## 2021-05-07 DIAGNOSIS — Z86010 Personal history of colonic polyps: Secondary | ICD-10-CM | POA: Insufficient documentation

## 2021-05-07 DIAGNOSIS — D125 Benign neoplasm of sigmoid colon: Secondary | ICD-10-CM | POA: Insufficient documentation

## 2021-05-07 DIAGNOSIS — Z87891 Personal history of nicotine dependence: Secondary | ICD-10-CM | POA: Insufficient documentation

## 2021-05-07 DIAGNOSIS — Z7902 Long term (current) use of antithrombotics/antiplatelets: Secondary | ICD-10-CM | POA: Insufficient documentation

## 2021-05-07 DIAGNOSIS — K5730 Diverticulosis of large intestine without perforation or abscess without bleeding: Secondary | ICD-10-CM | POA: Diagnosis not present

## 2021-05-07 DIAGNOSIS — Z951 Presence of aortocoronary bypass graft: Secondary | ICD-10-CM | POA: Insufficient documentation

## 2021-05-07 DIAGNOSIS — R197 Diarrhea, unspecified: Secondary | ICD-10-CM | POA: Insufficient documentation

## 2021-05-07 DIAGNOSIS — Z955 Presence of coronary angioplasty implant and graft: Secondary | ICD-10-CM | POA: Diagnosis not present

## 2021-05-07 DIAGNOSIS — K635 Polyp of colon: Secondary | ICD-10-CM | POA: Diagnosis not present

## 2021-05-07 DIAGNOSIS — K2270 Barrett's esophagus without dysplasia: Secondary | ICD-10-CM | POA: Diagnosis not present

## 2021-05-07 HISTORY — PX: ESOPHAGOGASTRODUODENOSCOPY (EGD) WITH PROPOFOL: SHX5813

## 2021-05-07 HISTORY — PX: BIOPSY: SHX5522

## 2021-05-07 HISTORY — PX: COLONOSCOPY WITH PROPOFOL: SHX5780

## 2021-05-07 HISTORY — PX: POLYPECTOMY: SHX5525

## 2021-05-07 SURGERY — COLONOSCOPY WITH PROPOFOL
Anesthesia: Monitor Anesthesia Care

## 2021-05-07 MED ORDER — LIDOCAINE 2% (20 MG/ML) 5 ML SYRINGE
INTRAMUSCULAR | Status: DC | PRN
  Administered 2021-05-07: 80 mg via INTRAVENOUS

## 2021-05-07 MED ORDER — EPHEDRINE SULFATE-NACL 50-0.9 MG/10ML-% IV SOSY
PREFILLED_SYRINGE | INTRAVENOUS | Status: DC | PRN
  Administered 2021-05-07: 10 mg via INTRAVENOUS

## 2021-05-07 MED ORDER — LACTATED RINGERS IV SOLN
INTRAVENOUS | Status: AC | PRN
  Administered 2021-05-07: 1000 mL via INTRAVENOUS

## 2021-05-07 MED ORDER — SODIUM CHLORIDE 0.9 % IV SOLN
INTRAVENOUS | Status: DC
Start: 2021-05-07 — End: 2021-05-07

## 2021-05-07 MED ORDER — PROPOFOL 500 MG/50ML IV EMUL
INTRAVENOUS | Status: DC | PRN
  Administered 2021-05-07: 125 ug/kg/min via INTRAVENOUS

## 2021-05-07 SURGICAL SUPPLY — 24 items

## 2021-05-07 NOTE — Discharge Instructions (Signed)
The Barrett's Esophagus looks the same - biopsies taken.  Two tiny colon polyps removed.  I took colon biopsies also - regarding diarrhea.  I will be in touch.  Restart Plavix tomorrow.  I appreciate the opportunity to care for you. Gatha Mayer, MD, FACG  YOU HAD AN ENDOSCOPIC PROCEDURE TODAY: Refer to the procedure report and other information in the discharge instructions given to you for any specific questions about what was found during the examination. If this information does not answer your questions, please call Dr. Celesta Aver office at [phone] to clarify.   YOU SHOULD EXPECT: Some feelings of bloating in the abdomen. Passage of more gas than usual. Walking can help get rid of the air that was put into your GI tract during the procedure and reduce the bloating. If you had a lower endoscopy (such as a colonoscopy or flexible sigmoidoscopy) you may notice spotting of blood in your stool or on the toilet paper. Some abdominal soreness may be present for a day or two, also.  DIET: Your first meal following the procedure should be a light meal and then it is ok to progress to your normal diet. A half-sandwich or bowl of soup is an example of a good first meal. Heavy or fried foods are harder to digest and may make you feel nauseous or bloated. Drink plenty of fluids but you should avoid alcoholic beverages for 24 hours.   ACTIVITY: Your care partner should take you home directly after the procedure. You should plan to take it easy, moving slowly for the rest of the day. You can resume normal activity the day after the procedure however YOU SHOULD NOT DRIVE, use power tools, machinery or perform tasks that involve climbing or major physical exertion for 24 hours (because of the sedation medicines used during the test).   SYMPTOMS TO REPORT IMMEDIATELY: A gastroenterologist can be reached at any hour. Please call [phone]  for any of the following symptoms:  Following lower  endoscopy (colonoscopy, flexible sigmoidoscopy) Excessive amounts of blood in the stool  Significant tenderness, worsening of abdominal pains  Swelling of the abdomen that is new, acute  Fever of 100 or higher  Following upper endoscopy (EGD, EUS, ERCP, esophageal dilation) Vomiting of blood or coffee ground material  New, significant abdominal pain  New, significant chest pain or pain under the shoulder blades  Painful or persistently difficult swallowing  New shortness of breath  Black, tarry-looking or red, bloody stools  FOLLOW UP:  If any biopsies were taken you will be contacted by phone or by letter within the next 1-3 weeks. Call [phone]  if you have not heard about the biopsies in 3 weeks.  Please also call with any specific questions about appointments or follow up tests.

## 2021-05-07 NOTE — H&P (Signed)
[facility] History and Physical   Primary Care Physician:  Medicine, [facility]   Reason for Procedure:   Barrett's esophagus, diarrhea  Plan:    EGD, colonoscopy     HPI: Angel Khan is a 80 y.o. male here for evaluation of diarrhea and also f/u barett's esophagus   Past Medical History:  Diagnosis Date  . Adenomatous colon polyp   . Barrett esophagus   . CAD (coronary artery disease)   . Chronic headaches   . Complication of anesthesia    Hard to wake up  . COPD (chronic obstructive pulmonary disease) ([place])   . Coronary atherosclerosis   . Diverticulitis of colon December 2012   Diagnosed by CT scan [facility]  . DM (diabetes mellitus) ([place])   . Dyspnea   . GERD (gastroesophageal reflux disease)   . Hemorrhoids   . History of kidney stones   . HLD (hyperlipidemia)   . Horseshoe kidney   . HTN (hypertension)   . Melanoma ([place])   . Poor short term memory   . Sleep apnea    Can not use a cpap  . Tracheobronchomalacia     Past Surgical History:  Procedure Laterality Date  . BRAIN SURGERY     Aneurysm 2004  . CARDIAC CATHETERIZATION  01/01/2013  . CHOLECYSTECTOMY  01/12/2013   Dr. Gerlene Fee  . COLONOSCOPY W/ BIOPSIES AND POLYPECTOMY  09/08/2007   adenomatous polyps, diverticulosis, external hemorrhoids  . CORONARY ARTERY BYPASS GRAFT  07/11/2009   off-pump CABG: LIMA-LAD 07/11/09; occluded graft 09/07/09, s/p DES LAD just after DIAG  . CORONARY STENT PLACEMENT     x 5  . ENDARTERECTOMY Left 03/14/2021   Procedure: LEFT CAROTID ARTERY ENDARTERECTOMY;  Surgeon: Serafina Mitchell, MD;  Location: [place];  Service: Vascular;  Laterality: Left;  . HERNIA REPAIR     abdominal  . LEFT CAROTID ARTERY ENDARTERECTOMY   03/14/2021  . TONSILLECTOMY    . UMBILICAL HERNIA REPAIR    . UPPER GASTROINTESTINAL ENDOSCOPY  10/12/2010   barrett's, fondic gland polyps, duodenitis    Prior to Admission medications    Medication Sig Start Date End Date Taking? Authorizing Provider  Ascorbic Acid (VITAMIN C WITH ROSE HIPS) 500 MG tablet Take 1,000 mg by mouth daily.   Yes [provider]  aspirin 325 MG EC tablet Take 325 mg by mouth at bedtime.    Yes [provider]  Cholecalciferol (VITAMIN D) 50 MCG (2000 UT) CAPS Take 2,000 Units by mouth daily.   Yes [provider]  cloNIDine (CATAPRES) 0.1 MG tablet Take 0.1 mg by mouth daily.   Yes [provider]  clopidogrel (PLAVIX) 75 MG tablet Take 75 mg by mouth at bedtime.   Yes [provider]  dicyclomine (BENTYL) 20 MG tablet Take 1 tablet (20 mg total) by mouth 3 (three) times daily before meals. 04/05/21  Yes Gatha Mayer, MD  ezetimibe (ZETIA) 10 MG tablet Take 10 mg by mouth every morning.   Yes [provider]  famotidine (PEPCID) 20 MG tablet Take 20 mg by mouth daily.   Yes [provider]  isosorbide mononitrate (IMDUR) 60 MG 24 hr tablet Take 60 mg by mouth at bedtime.   Yes [provider]  losartan (COZAAR) 100 MG tablet Take 100 mg by mouth daily.   Yes [provider]  MAGNESIUM PO Take 400 mg by mouth daily.   Yes [provider]  montelukast (  SINGULAIR) 10 MG tablet Take 1 tablet (10 mg total) by mouth at bedtime. 07/01/19  Yes Tanda Rockers, MD  naproxen sodium (ALEVE) 220 MG tablet Take 220-440 mg by mouth daily as needed (Pain).   Yes [provider]  pantoprazole (PROTONIX) 40 MG tablet Take 1 tablet (40 mg total) by mouth daily. 12/14/19  Yes Gatha Mayer, MD  Potassium 99 MG TABS Take 99 mg by mouth daily.   Yes [provider]  Respiratory Therapy Supplies (FLUTTER) DEVI by Does not apply route as directed.   Yes [provider]  rosuvastatin (CRESTOR) 5 MG tablet Take 1 tablet (5 mg total) by mouth daily. 03/15/21 03/15/22 Yes Baglia, Corrina, PA-C  sertraline (ZOLOFT) 50 MG tablet Take 50 mg by mouth daily. 05/08/20   Yes [provider]  Vitamin A 2400 MCG (8000 UT) CAPS Take 8,000 Units by mouth daily.   Yes [provider]  HYDROcodone-acetaminophen (NORCO) 7.5-325 MG tablet Take 1 tablet by mouth 2 (two) times daily as needed for severe pain. 03/15/20   [provider]  ipratropium (ATROVENT) 0.06 % nasal spray Place 2 sprays into both nostrils 3 (three) times daily as needed for rhinitis (runny nose). Patient taking differently: Place 2 sprays into both nostrils as needed for rhinitis (runny nose). 12/14/20   Garnet Sierras, DO    Current Facility-Administered Medications  Medication Dose Route Frequency Provider Last Rate Last Admin  . 0.9 %  sodium chloride infusion   Intravenous Continuous Gatha Mayer, MD        Allergies as of 04/05/2021 - Review Complete 04/05/2021  Allergen Reaction Noted  . Depakote [divalproex sodium]  12/28/2012  . Magnesium  07/18/2020  . Other Other (See Comments) 03/23/2013  . Propranolol Other (See Comments) 11/15/2016  . Statins Other (See Comments) 11/15/2016    Family History  Problem Relation Age of Onset  . Colitis Other        niece  . Diabetes Mother   . Heart disease Mother   . Heart disease Father   . Heart disease Brother   . Rheumatic fever Sister   . Colon cancer Neg Hx   . Stomach cancer Neg Hx   . Pancreatic cancer Neg Hx     Social History   Social History Narrative   Married has 3 children he is retired   Former smoker, no alcohol tobacco or drug use now     Review of Systems:  All other review of systems negative except as mentioned in the HPI.  Physical Exam: Vital signs in last 24 hours: Temp:  [96.7 F (35.9 C)] 96.7 F (35.9 C) (05/09 1011) Pulse Rate:  [67] 67 (05/09 1011) Resp:  [16] 16 (05/09 1011) BP: (147)/(114) 147/114 (05/09 1011) SpO2:  [97 %] 97 % (05/09 1011) Weight:  [83 kg] 83 kg (05/09 1011)   General:   Alert,  Well-developed, well-nourished, pleasant and cooperative in  NAD Lungs:  Clear throughout to auscultation.   Heart:  Regular rate and rhythm; no murmurs, clicks, rubs,  or gallops. Abdomen:  Soft, nontender and nondistended. Normal bowel sounds.   Neuro/Psych:  Alert and cooperative. Normal mood and affect. A and O x 3   @Danyale Ridinger  Simonne Maffucci, MD, [facility] [phone] (pager) 05/07/2021 10:36 AM@

## 2021-05-07 NOTE — Op Note (Signed)
[facility] Patient Name: Angel Khan Procedure Date: 05/07/2021 MRN: [id number] Attending MD: Gatha Mayer , MD Date of Birth: [birth year] CSN: [id number] Age: 80 Admit Type: Outpatient Procedure:                Colonoscopy Indications:              Clinically significant diarrhea of unexplained                            origin Providers:                Gatha Mayer, MD, Dulcy Fanny RN, Ladona Ridgel, Technician, Caryl Pina CRNA Referring MD:              Medicines:                Propofol per Anesthesia, Monitored Anesthesia Care Complications:            No immediate complications. Estimated Blood Loss:     Estimated blood loss was minimal. Procedure:                Pre-Anesthesia Assessment:                           - Prior to the procedure, a History and Physical                            was performed, and patient medications and                            allergies were reviewed. The patient's tolerance of                            previous anesthesia was also reviewed. The risks                            and benefits of the procedure and the sedation                            options and risks were discussed with the patient.                            All questions were answered, and informed consent                            was obtained. Prior Anticoagulants: The patient                            last took Plavix (clopidogrel) 2 days prior to the                            procedure. ASA Grade Assessment: III - A patient  with severe systemic disease. After reviewing the                            risks and benefits, the patient was deemed in                            satisfactory condition to undergo the procedure.                           - Prior to the procedure, a History and Physical                            was performed, and patient medications and                             allergies were reviewed. The patient's tolerance of                            previous anesthesia was also reviewed. The risks                            and benefits of the procedure and the sedation                            options and risks were discussed with the patient.                            All questions were answered, and informed consent                            was obtained. Prior Anticoagulants: The patient                            last took Plavix (clopidogrel) 2 days prior to the                            procedure. ASA Grade Assessment: III - A patient                            with severe systemic disease. After reviewing the                            risks and benefits, the patient was deemed in                            satisfactory condition to undergo the procedure.                           After obtaining informed consent, the colonoscope                            was passed under direct vision. Throughout the  procedure, the patient's blood pressure, pulse, and                            oxygen saturations were monitored continuously. The                            CF-HQ190L ([id number] Olympus colonoscope was                            introduced through the anus and advanced to the the                            terminal ileum, with identification of the                            appendiceal orifice and IC valve. The colonoscopy                            was performed without difficulty. The patient                            tolerated the procedure well. The quality of the                            bowel preparation was good. The terminal ileum,                            ileocecal valve, appendiceal orifice, and rectum                            were photographed. Scope In: 11:02:55 AM Scope Out: 11:15:38 AM Scope Withdrawal Time: 0 hours 8 minutes 55 seconds  Total Procedure Duration: 0 hours 12 minutes 43 seconds   Findings:      The perianal and digital rectal examinations were normal. Pertinent       negatives include normal prostate (size, shape, and consistency).      Two sessile polyps were found in the sigmoid colon. The polyps were       diminutive in size. These polyps were removed with a cold snare.       Resection and retrieval were complete. Verification of patient       identification for the specimen was done. Estimated blood loss was       minimal.      Multiple diverticula were found in the sigmoid colon, descending colon       and transverse colon.      Hemorrhoids were found.      The exam was otherwise without abnormality on direct and retroflexion       views. Impression:               - Two diminutive polyps in the sigmoid colon,                            removed with a cold snare. Resected and retrieved.                           -  Diverticulosis in the sigmoid colon, in the                            descending colon and in the transverse colon.                           - Hemorrhoids.                           - The examination was otherwise normal on direct                            and retroflexion views. Moderate Sedation:      Not Applicable - Patient had care per Anesthesia. Recommendation:           - Patient has a contact number available for                            emergencies. The signs and symptoms of potential                            delayed complications were discussed with the                            patient. Return to normal activities tomorrow.                            Written discharge instructions were provided to the                            patient.                           - Resume previous diet.                           - Continue present medications.                           - Resume Plavix (clopidogrel) at prior dose                            tomorrow. Procedure Code(s):        --- Professional ---                            [phone], Colonoscopy, flexible; with removal of                            tumor(s), polyp(s), or other lesion(s) by snare                            technique Diagnosis Code(s):        --- Professional ---                           K63.5, Polyp of colon  K64.9, Unspecified hemorrhoids                           R19.7, Diarrhea, unspecified                           K57.30, Diverticulosis of large intestine without                            perforation or abscess without bleeding CPT copyright 2019 [employer]. All rights reserved. The codes documented in this report are preliminary and upon coder review may  be revised to meet current compliance requirements. Gatha Mayer, MD 05/07/2021 11:42:37 AM This report has been signed electronically. Number of Addenda: 0

## 2021-05-07 NOTE — Anesthesia Preprocedure Evaluation (Addendum)
Anesthesia Evaluation  Patient identified by MRN, date of birth, ID band Patient awake    Reviewed: Allergy & Precautions, NPO status , Patient's Chart, lab work & pertinent test results  Airway Mallampati: II  TM Distance: >3 FB Neck ROM: Full    Dental  (+) Upper Dentures, Lower Dentures   Pulmonary asthma , sleep apnea , COPD, former smoker,    Pulmonary exam normal        Cardiovascular hypertension, Pt. on medications + CAD, + Cardiac Stents and + CABG   Rhythm:Regular Rate:Normal     Neuro/Psych  Headaches, negative psych ROS   GI/Hepatic Neg liver ROS, GERD  Medicated,  Endo/Other  diabetes  Renal/GU Renal disease     Musculoskeletal   Abdominal Normal abdominal exam  (+)   Peds  Hematology   Anesthesia Other Findings   Reproductive/Obstetrics                            Anesthesia Physical Anesthesia Plan  ASA: III  Anesthesia Plan: MAC   Post-op Pain Management:    Induction: Intravenous  PONV Risk Score and Plan: 0 and Propofol infusion  Airway Management Planned: Nasal Cannula and Natural Airway  Additional Equipment: None  Intra-op Plan:   Post-operative Plan:   Informed Consent: I have reviewed the patients History and Physical, chart, labs and discussed the procedure including the risks, benefits and alternatives for the proposed anesthesia with the patient or authorized representative who has indicated his/her understanding and acceptance.       Plan Discussed with: CRNA  Anesthesia Plan Comments: (Echo 2020: Left Ventricle Normal left ventricular structure and size. The calculated left ventricular ejection fraction is 51%. There is mild hypertrophy. Systolic function is low normal with an ejection fraction of 50-55%. ; GLS = 18.2% from the apical 4,3,2 chamber views respectively. Wall motion is within normal limits. There is grade I (mild) diastolic  dysfunction.  Right Ventricle Right ventricle cavity appears normal. Systolic function is normal. Wall thickness is normal.  Left Atrium Left atrium cavity is mildly dilated.  Right Atrium Normal sized right atrium.  Mitral Valve The leaflets are moderately thickened. There is trace regurgitation. The valve has normal function.  Tricuspid Valve Tricuspid valve structure is normal. There is mild regurgitation. The right ventricular systolic pressure is mildly elevated.  Aortic Valve Normal tricuspid aortic valve. There is mild sclerosis. There is no regurgitation or stenosis.  Pulmonic Valve Trace regurgitation.  Ascending Aorta The aorta appears normal in size.  Pericardium There is no pericardial effusion.  Study Details A complete echo was performed using complete 2D, color flow Doppler and spectral Doppler. Overall the study quality was adequate. No significant change noted when compared to the previous study performed on 08/27/2017. )       Anesthesia Quick Evaluation

## 2021-05-07 NOTE — Op Note (Signed)
[facility] Patient Name: Angel Khan Procedure Date: 05/07/2021 MRN: [id number] Attending MD: Gatha Mayer , MD Date of Birth: 1941-04-04 CSN: [id number] Age: 80 Admit Type: Outpatient Procedure:                Upper GI endoscopy Indications:               Providers:                Gatha Mayer, MD, Ladona Ridgel,                            Pixie Casino RN, Caryl Pina CRNA Referring MD:              Medicines:                 Complications:            No immediate complications. Estimated Blood Loss:     Estimated blood loss was minimal. Procedure:                Pre-Anesthesia Assessment:                           - Prior to the procedure, a History and Physical                            was performed, and patient medications and                            allergies were reviewed. The patient's tolerance of                            previous anesthesia was also reviewed. The risks                            and benefits of the procedure and the sedation                            options and risks were discussed with the patient.                            All questions were answered, and informed consent                            was obtained. Prior Anticoagulants: The patient                            last took Plavix (clopidogrel) 2 days prior to the                            procedure. ASA Grade Assessment: III - A patient                            with severe systemic disease. After reviewing the  risks and benefits, the patient was deemed in                            satisfactory condition to undergo the procedure.                           After obtaining informed consent, the endoscope was                            passed under direct vision. Throughout the                            procedure, the patient's blood pressure, pulse, and                            oxygen saturations were monitored  continuously. The                            [id number]-[id number] ([id number] Olympus gastroscope was                            introduced through the mouth, and advanced to the                            second part of duodenum. 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 The       maximum longitudinal extent of these mucosal changes was 2 cm in length.       Mucosa was biopsied with a cold forceps for histology in 4 quadrants       from 39 to 41 cm from the incisors. One specimen bottle was sent to       pathology. Verification of patient identification for the specimen was       done. Estimated blood loss was minimal.      The exam was otherwise without abnormality.      The cardia and gastric fundus were normal on retroflexion. Impression:               - Esophageal mucosal changes secondary to                            established short-segment Barrett's disease.                            Biopsied.                           - The examination was otherwise normal. Moderate Sedation:      Not Applicable - Patient had care per Anesthesia. Recommendation:           - Patient has a contact number available for                            emergencies. The signs and symptoms of potential  delayed complications were discussed with the                            patient. Return to normal activities tomorrow.                            Written discharge instructions were provided to the                            patient.                           - Resume previous diet.                           - Continue present medications.                           - Await pathology results.                           - See the other procedure note for documentation of                            additional recommendations.                           - Resume Plavix  (clopidogrel) at prior dose                            tomorrow. Procedure Code(s):        --- Professional ---                           [id number], Esophagogastroduodenoscopy, flexible,                            transoral; with biopsy, single or multiple Diagnosis Code(s):        --- Professional ---                           K22.70, Barrett's esophagus without dysplasia CPT copyright 2019 American [employer] Association. All rights reserved. The codes documented in this report are preliminary and upon coder review may  be revised to meet current compliance requirements. Gatha Mayer, MD 05/07/2021 11:39:08 AM This report has been signed electronically. Number of Addenda: 0

## 2021-05-07 NOTE — Transfer of Care (Signed)
Immediate Anesthesia Transfer of Care Note  Patient: Angel Khan  Procedure(s) Performed: COLONOSCOPY WITH PROPOFOL (N/A ) ESOPHAGOGASTRODUODENOSCOPY (EGD) WITH PROPOFOL (N/A ) BIOPSY POLYPECTOMY  Patient Location: Endoscopy Unit  Anesthesia Type:MAC  Level of Consciousness: awake, alert , oriented and patient cooperative  Airway & Oxygen Therapy: Patient Spontanous Breathing and Patient connected to face mask oxygen  Post-op Assessment: Report given to RN, Post -op Vital signs reviewed and stable and Patient moving all extremities X 4  Post vital signs: Reviewed and stable  Last Vitals:  Vitals Value Taken Time  BP 129/79 05/07/21 1120  Temp 36.7 C 05/07/21 1120  Pulse 73 05/07/21 1124  Resp 22 05/07/21 1124  SpO2 100 % 05/07/21 1124  Vitals shown include unvalidated device data.  Last Pain:  Vitals:   05/07/21 1120  TempSrc: Axillary  PainSc: Asleep         Complications: No complications documented.

## 2021-05-07 NOTE — Anesthesia Postprocedure Evaluation (Signed)
Anesthesia Post Note  Patient: Angel Khan  Procedure(s) Performed: COLONOSCOPY WITH PROPOFOL (N/A ) ESOPHAGOGASTRODUODENOSCOPY (EGD) WITH PROPOFOL (N/A ) BIOPSY POLYPECTOMY     Patient location during evaluation: PACU Anesthesia Type: MAC Level of consciousness: awake and alert Pain management: pain level controlled Vital Signs Assessment: post-procedure vital signs reviewed and stable Respiratory status: spontaneous breathing, nonlabored ventilation, respiratory function stable and patient connected to nasal cannula oxygen Cardiovascular status: stable and blood pressure returned to baseline Postop Assessment: no apparent nausea or vomiting Anesthetic complications: no   No complications documented.  Last Vitals:  Vitals:   05/07/21 1130 05/07/21 1145  BP: (!) 153/95 (!) 149/48  Pulse: 80 69  Resp: 19 (!) 22  Temp:    SpO2: 100% 100%    Last Pain:  Vitals:   05/07/21 1145  TempSrc:   PainSc: 0-No pain                 Effie Berkshire

## 2021-05-08 ENCOUNTER — Encounter (HOSPITAL_COMMUNITY): Payer: Self-pay | Admitting: Internal Medicine

## 2021-05-08 LAB — SURGICAL PATHOLOGY

## 2021-05-18 ENCOUNTER — Encounter: Payer: Self-pay | Admitting: Internal Medicine

## 2021-07-04 ENCOUNTER — Emergency Department (INDEPENDENT_AMBULATORY_CARE_PROVIDER_SITE_OTHER)
Admission: EM | Admit: 2021-07-04 | Discharge: 2021-07-04 | Disposition: A | Discharge status: Home / Self Care | Payer: Medicare Other | Source: Home / Self Care

## 2021-07-04 ENCOUNTER — Other Ambulatory Visit: Payer: Self-pay

## 2021-07-04 ENCOUNTER — Encounter: Payer: Self-pay | Admitting: Emergency Medicine

## 2021-07-04 DIAGNOSIS — H8110 Benign paroxysmal vertigo, unspecified ear: Secondary | ICD-10-CM

## 2021-07-04 DIAGNOSIS — R42 Dizziness and giddiness: Secondary | ICD-10-CM

## 2021-07-04 DIAGNOSIS — I493 Ventricular premature depolarization: Secondary | ICD-10-CM

## 2021-07-04 NOTE — ED Provider Notes (Signed)
[facility]    CSN: [id number] Arrival date & time: 07/04/21  0957      History   Chief Complaint Chief Complaint  Patient presents with   Shortness of Breath   Dizziness    HPI Angel Khan is a 80 y.o. male.   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  Patient currently denies dizziness, anginal equivalents, presyncopal or syncopal episodes.  Patient has an appointment with his PCP tomorrow but did not want to wait, reports this PCP visit is over the phone as they have no in person appointment slots available.  PMH significant for CAD, COPD, asymptomatic carotid artery stenosis, and T2DM.  Also reports history of vertigo and has been prescribed Meclizine meclizine that helps dizziness resolved completely when he takes it.  Additionally, patient denies history of COPD.  Reports does not know who diagnosed him with this medical condition.  Additionally, patient reports discontinuing medication for depression suddenly sometime ago.  Patient does not know name of medication nor when he actually stopped taking antidepressant medication.  Past Medical History:  Diagnosis Date   Adenomatous colon polyp    Barrett esophagus    CAD (coronary artery disease)    Chronic headaches    Complication of anesthesia    Hard to wake up   COPD (chronic obstructive pulmonary disease) ([place])    Coronary atherosclerosis    Diverticulitis of colon December 2012   Diagnosed by CT scan [facility]   DM (diabetes mellitus) ([place])    Dyspnea    GERD (gastroesophageal reflux disease)    Hemorrhoids    History of kidney stones    HLD (hyperlipidemia)    Horseshoe kidney    HTN (hypertension)    Melanoma (HCC)    Poor short term memory    Sleep apnea    Can not use a cpap   Tracheobronchomalacia     Patient Active Problem List   Diagnosis Date Noted    Asymptomatic carotid artery stenosis without infarction, right 03/14/2021   Gastroesophageal reflux disease 04/20/2020   OSA (obstructive sleep apnea) 04/04/2020   Seasonal and perennial allergic rhinitis 04/04/2020   DOE (dyspnea on exertion) 08/04/2019   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 Gerlene Fee   COLONOSCOPY W/ BIOPSIES AND POLYPECTOMY  09/08/2007   adenomatous polyps, diverticulosis, external hemorrhoids   COLONOSCOPY WITH PROPOFOL N/A 05/07/2021   Procedure: COLONOSCOPY WITH PROPOFOL;  Surgeon: Gatha Mayer, MD;  [facility]: [facility];  Service: Endoscopy;  Laterality: N/A;   CORONARY ARTERY BYPASS GRAFT  07/11/2009   off-pump CABG: LIMA-LAD 07/11/09; occluded graft 09/07/09, s/p DES LAD just after DIAG   CORONARY STENT PLACEMENT     x 5   ENDARTERECTOMY Left 03/14/2021   Procedure: LEFT CAROTID ARTERY ENDARTERECTOMY;  Surgeon: Serafina Mitchell, MD;  Location: [facility]  OR;  Service: Vascular;  Laterality: Left;   ESOPHAGOGASTRODUODENOSCOPY (EGD) WITH PROPOFOL N/A 05/07/2021   Procedure: ESOPHAGOGASTRODUODENOSCOPY (EGD) WITH PROPOFOL;  Surgeon: Gatha Mayer, MD;  [facility]: [facility];  Service: Endoscopy;  Laterality: N/A;   HERNIA REPAIR     abdominal   LEFT CAROTID ARTERY ENDARTERECTOMY   03/14/2021   POLYPECTOMY  05/07/2021   Procedure: POLYPECTOMY;  Surgeon: Gatha Mayer, MD;  [facility]: [facility];  Service: Endoscopy;;   TONSILLECTOMY      UMBILICAL HERNIA REPAIR     UPPER GASTROINTESTINAL ENDOSCOPY  10/12/2010   barrett's, fondic gland polyps, duodenitis       Home Medications    Prior to Admission medications   Medication Sig Start Date End Date Taking? Authorizing Provider  Ascorbic Acid (VITAMIN C WITH ROSE HIPS) 500 MG tablet Take 1,000 mg by mouth daily.    [provider]  aspirin 325 MG EC tablet Take 325 mg by mouth at bedtime.     [provider]  Cholecalciferol (VITAMIN D) 50 MCG (2000 UT) CAPS Take 2,000 Units by mouth daily.    [provider]  cloNIDine (CATAPRES) 0.1 MG tablet Take 0.1 mg by mouth daily.    [provider]  clopidogrel (PLAVIX) 75 MG tablet Take 75 mg by mouth at bedtime.    [provider]  dicyclomine (BENTYL) 20 MG tablet Take 1 tablet (20 mg total) by mouth 3 (three) times daily before meals. 04/05/21   Gatha Mayer, MD  ezetimibe (ZETIA) 10 MG tablet Take 10 mg by mouth every morning.    [provider]  famotidine (PEPCID) 20 MG tablet Take 20 mg by mouth daily.    [provider]  HYDROcodone-acetaminophen (NORCO) 7.5-325 MG tablet Take 1 tablet by mouth 2 (two) times daily as needed for severe pain. 03/15/20   [provider]  ipratropium (ATROVENT) 0.06 % nasal spray Place 2 sprays into both nostrils 3 (three) times daily as needed for rhinitis (runny nose). Patient taking differently: Place 2 sprays into both nostrils as needed for rhinitis (runny nose). 12/14/20   Garnet Sierras, DO  isosorbide mononitrate (IMDUR) 60 MG 24 hr tablet Take 60 mg by mouth at bedtime.    [provider]  losartan (COZAAR) 100 MG tablet Take 100 mg by mouth daily.    [provider]  MAGNESIUM PO Take 400 mg by mouth daily.    [provider]  montelukast (SINGULAIR) 10 MG tablet Take 1 tablet (10 mg total) by mouth at bedtime. 07/01/19   Tanda Rockers, MD  naproxen sodium (ALEVE) 220 MG tablet Take 220-440  mg by mouth daily as needed (Pain).    [provider]  pantoprazole (PROTONIX) 40 MG tablet Take 1 tablet (40 mg total) by mouth daily. 12/14/19   Gatha Mayer, MD  Potassium 99 MG TABS Take 99 mg by mouth daily.    [provider]  Respiratory Therapy Supplies (FLUTTER) DEVI by Does not apply route as directed.    [provider]  rosuvastatin (CRESTOR) 5 MG tablet Take 1 tablet (5 mg total) by mouth daily. 03/15/21 03/15/22  Baglia, Corrina, PA-C  sertraline (ZOLOFT) 50 MG tablet Take 50 mg by mouth daily. 05/08/20   [provider]  Vitamin A 2400 MCG (8000 UT) CAPS Take 8,000 Units by mouth daily.    [provider]    Family History Family History  Problem Relation Age of Onset  Colitis Other        niece   Diabetes Mother    Heart disease Mother    Heart disease Father    Heart disease Brother    Rheumatic fever Sister    Colon cancer Neg Hx    Stomach cancer Neg Hx    Pancreatic cancer Neg Hx     Social History Social History   Tobacco Use   Smoking status: Former    Packs/day: 1.00    Years: 40.00    Pack years: 40.00    Types: Cigarettes    Quit date: 07/30/1976    Years since quitting: 44.9   Smokeless tobacco: Never  Vaping Use   Vaping Use: Never used  Substance Use Topics   Alcohol use: No   Drug use: No     Allergies   Depakote [divalproex sodium], Magnesium, Other, Propranolol, and Statins   Review of Systems Review of Systems  Neurological:  Positive for dizziness.       Dizziness x 1 week  All other systems reviewed and are negative.   Physical Exam Triage Vital Signs ED Triage Vitals  Enc Vitals Group     BP 07/04/21 1051 (!) 144/81     Pulse Rate 07/04/21 1051 64     Resp 07/04/21 1051 18     Temp 07/04/21 1051 98.6 F (37 C)     Temp Source 07/04/21 1051 Oral     SpO2 07/04/21 1051 97 %     Weight 07/04/21 1052 183 lb (83 kg)     Height 07/04/21 1052 5\' 5"  (1.651 m)     Head  Circumference --      Peak Flow --      Pain Score 07/04/21 1052 0     Pain Loc --      Pain Edu? --      Excl. in [place]? --    Orthostatic VS for the past 24 hrs:  BP- Lying Pulse- Lying BP- Sitting Pulse- Sitting BP- Standing at 0 minutes Pulse- Standing at 0 minutes  07/04/21 1055 144/81 74 162/77 74 148/81 88    Updated Vital Signs BP (!) 144/81 (BP Location: Left Arm)   Pulse 64   Temp 98.6 F (37 C) (Oral)   Resp 18   Ht 5\' 5"  (1.651 m)   Wt 183 lb (83 kg)   SpO2 97%   BMI 30.45 kg/m      Physical Exam Vitals and nursing note reviewed.  Constitutional:      General: He is not in acute distress.    Appearance: Normal appearance. He is normal weight. He is not ill-appearing, toxic-appearing or diaphoretic.  HENT:     Head: Normocephalic and atraumatic.     Right Ear: Tympanic membrane and ear canal normal.     Left Ear: Tympanic membrane and ear canal normal.     Nose: Rhinorrhea present. No congestion.     Mouth/Throat:     Mouth: Mucous membranes are moist.     Pharynx: Oropharynx is clear.  Eyes:     Extraocular Movements: Extraocular movements intact.     Conjunctiva/sclera: Conjunctivae normal.     Pupils: Pupils are equal, round, and reactive to light.  Neck:     Comments: No JVD, no bruit Cardiovascular:     Rate and Rhythm: Normal rate and regular rhythm.     Pulses: Normal pulses.     Heart sounds: Normal heart sounds. No murmur heard.  Comments: Infrequent ectopic beat noted, PT +2 bounding no C/E/C Pulmonary:     Effort: Pulmonary effort is normal. No respiratory distress.     Breath sounds: Normal breath sounds. No stridor. No wheezing, rhonchi or rales.  Chest:     Chest wall: No tenderness.  Musculoskeletal:        General: Normal range of motion.     Cervical back: Normal range of motion and neck supple. No rigidity or tenderness.  Lymphadenopathy:     Cervical: No cervical adenopathy.  Skin:    General: Skin is warm and dry.   Neurological:     General: No focal deficit present.     Mental Status: He is alert and oriented to person, place, and time. Mental status is at baseline.     Cranial Nerves: No cranial nerve deficit.     Sensory: No sensory deficit.     Motor: No weakness.     Coordination: Coordination normal.     Gait: Gait normal.  Psychiatric:        Mood and Affect: Mood normal.        Behavior: Behavior normal.        Thought Content: Thought content normal.     UC Treatments / Results  Labs (all labs ordered are listed, but only abnormal results are displayed) Labs Reviewed - No data to display  EKG   Radiology No results found.  Procedures Procedures (including critical care time)  Medications Ordered in UC Medications - No data to display  Initial Impression / Assessment and Plan / UC Course  I have reviewed the triage vital signs and the nursing notes.  Pertinent labs & imaging results that were available during my care of the patient were reviewed by me and considered in my medical decision making (see chart for details).    MDM: 1.  Dizziness-advised patient EKG revealed normal sinus rhythm with occasional PVC and PAC, 2. Instructed patient to follow-up with Cardiology today.  3. Advised patient may take Meclizine for now until he speaks with PCP tomorrow for possible further evaluation.  Additionally, advised patient to follow-up with PCP to represcribe antidepressant that he is stopped recently as in stopping SSRIs may cause dizziness as well. Final Clinical Impressions(s) / UC Diagnoses   Final diagnoses:  Dizziness  Benign paroxysmal positional vertigo, unspecified laterality  PVC's (premature ventricular contractions)     Discharge Instructions      1.  Dizziness-advised patient EKG revealed normal sinus rhythm with occasional PVC and PAC, 2. Instructed patient to follow-up with Cardiology today for appointment.  3. Advised patient may take Meclizine for now  until he speaks with PCP tomorrow for possible further evaluation.  Additionally, advised patient to follow-up with PCP to prescribe antidepressant that he is stopped, advised patient as in stopping SSRIs may cause dizziness as well.     ED Prescriptions   None    PDMP not reviewed this encounter.   Eliezer Lofts, [place] 07/04/21 1316

## 2021-07-04 NOTE — Discharge Instructions (Addendum)
1.  Dizziness-advised patient EKG revealed normal sinus rhythm with occasional PVC and PAC, 2. Instructed patient to follow-up with Cardiology today for appointment.  3. Advised patient may take Meclizine for now until he speaks with PCP tomorrow for possible further evaluation.  Additionally, advised patient to follow-up with PCP to prescribe antidepressant that he is stopped, advised patient as in stopping SSRIs may cause dizziness as well.

## 2021-07-04 NOTE — ED Triage Notes (Signed)
Dizziness x 1 week Depression x 1 week Had brain aneurysm in 2004 and was told he would eventually have depression. He has appt with PCP tomorrow but did not want to wait.His meds were recently changed.

## 2021-11-09 ENCOUNTER — Emergency Department (INDEPENDENT_AMBULATORY_CARE_PROVIDER_SITE_OTHER)
Admission: EM | Admit: 2021-11-09 | Discharge: 2021-11-09 | Disposition: A | Discharge status: Home / Self Care | Payer: Medicare Other | Source: Home / Self Care | Attending: Family Medicine | Admitting: Family Medicine

## 2021-11-09 DIAGNOSIS — R053 Chronic cough: Secondary | ICD-10-CM | POA: Diagnosis not present

## 2021-11-09 MED ORDER — SULFAMETHOXAZOLE-TRIMETHOPRIM 800-160 MG PO TABS: 1.0000 | ORAL_TABLET | Freq: Two times a day (BID) | ORAL | 0 refills | Status: DC

## 2021-11-09 MED ORDER — BENZONATATE 100 MG PO CAPS: ORAL_CAPSULE | ORAL | 0 refills | Status: DC

## 2021-11-09 MED ORDER — PREDNISONE 20 MG PO TABS: ORAL_TABLET | ORAL | 0 refills | Status: DC

## 2021-11-09 NOTE — ED Triage Notes (Signed)
Pt presents with a cough that began the middle of october

## 2021-11-09 NOTE — Discharge Instructions (Signed)
Take plain guaifenesin (1200mg  extended release tabs such as Mucinex) twice daily, with plenty of water, for cough and congestion.  Get adequate rest.   May take Delsym Cough Suppressant ("12 Hour Cough Relief") at bedtime for nighttime cough.  Stop all antihistamines (Nyquil, etc) for now, and other non-prescription cough/cold preparations.

## 2021-11-09 NOTE — ED Provider Notes (Signed)
[facility]    CSN: [id number] Arrival date & time: 11/09/21  9833      History   Chief Complaint Chief Complaint  Patient presents with   Cough    HPI Angel Khan is a 80 y.o. male.   Patient developed URI symptoms about one month ago with significant amounts of nasal drainage.  He has generally improved except for a persistent cough.  He now frequently coughs until he gags but denies pleuritic pain or shortness of breath.  He was treated with doxycycline after a chest x-ray on 10/22/21 was normal, but he has had no improvement.  The history is provided by the patient.   Past Medical History:  Diagnosis Date   Adenomatous colon polyp    Barrett esophagus    CAD (coronary artery disease)    Chronic headaches    Complication of anesthesia    Hard to wake up   COPD (chronic obstructive pulmonary disease) ([place])    Coronary atherosclerosis    Diverticulitis of colon December 2012   Diagnosed by CT scan [facility]   DM (diabetes mellitus) ([place])    Dyspnea    GERD (gastroesophageal reflux disease)    Hemorrhoids    History of kidney stones    HLD (hyperlipidemia)    Horseshoe kidney    HTN (hypertension)    Melanoma (HCC)    Poor short term memory    Sleep apnea    Can not use a cpap   Tracheobronchomalacia     Patient Active Problem List   Diagnosis Date Noted   Asymptomatic carotid artery stenosis without infarction, right 03/14/2021   Gastroesophageal reflux disease 04/20/2020   OSA (obstructive sleep apnea) 04/04/2020   Seasonal and perennial allergic rhinitis 04/04/2020   DOE (dyspnea on exertion) 08/04/2019   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 Gerlene Fee   COLONOSCOPY W/ BIOPSIES AND POLYPECTOMY  09/08/2007   adenomatous polyps, diverticulosis, external hemorrhoids   COLONOSCOPY WITH PROPOFOL N/A 05/07/2021   Procedure: COLONOSCOPY WITH PROPOFOL;  Surgeon: Gatha Mayer, MD;  [facility];  Service: Endoscopy;  Laterality: N/A;   CORONARY ARTERY BYPASS GRAFT  07/11/2009   off-pump CABG: LIMA-LAD 07/11/09; occluded graft 09/07/09, s/p DES LAD just after DIAG   CORONARY STENT PLACEMENT     x 5   ENDARTERECTOMY Left 03/14/2021   Procedure: LEFT CAROTID ARTERY ENDARTERECTOMY;  Surgeon: Serafina Mitchell, MD;  Location: [facility] OR;  Service: Vascular;  Laterality: Left;   ESOPHAGOGASTRODUODENOSCOPY (EGD) WITH PROPOFOL N/A 05/07/2021   Procedure: ESOPHAGOGASTRODUODENOSCOPY (EGD) WITH PROPOFOL;  Surgeon: Gatha Mayer, MD;  [facility];  Service: Endoscopy;  Laterality: N/A;   HERNIA REPAIR     abdominal   LEFT CAROTID ARTERY ENDARTERECTOMY   03/14/2021   POLYPECTOMY  05/07/2021   Procedure: POLYPECTOMY;  Surgeon: Gatha Mayer, MD;  [facility];  Service: Endoscopy;;   TONSILLECTOMY  UMBILICAL HERNIA REPAIR     UPPER GASTROINTESTINAL ENDOSCOPY  10/12/2010   barrett's, fondic gland polyps, duodenitis       Home Medications    Prior to Admission medications   Medication Sig Start Date End Date Taking? Authorizing Provider  benzonatate (TESSALON) 100 MG capsule Take by mouth 3 (three) times daily as needed for cough.   Yes [provider]  guaiFENesin (MUCUS RELIEF ADULT PO) Take by mouth.   Yes [provider]  predniSONE (DELTASONE) 20 MG tablet Take one tab by mouth twice daily for 4 days, then one daily for 3 days. Take with  food. 11/09/21  Yes Kandra Nicolas, MD  sulfamethoxazole-trimethoprim (BACTRIM DS) 800-160 MG tablet Take 1 tablet by mouth 2 (two) times daily. 11/09/21  Yes Kandra Nicolas, MD  Ascorbic Acid (VITAMIN C WITH ROSE HIPS) 500 MG tablet Take 1,000 mg by mouth daily.    [provider]  aspirin 325 MG EC tablet Take 325 mg by mouth at bedtime.     [provider]  Cholecalciferol (VITAMIN D) 50 MCG (2000 UT) CAPS Take 2,000 Units by mouth daily.    [provider]  cloNIDine (CATAPRES) 0.1 MG tablet Take 0.1 mg by mouth daily.    [provider]  clopidogrel (PLAVIX) 75 MG tablet Take 75 mg by mouth at bedtime.    [provider]  dicyclomine (BENTYL) 20 MG tablet Take 1 tablet (20 mg total) by mouth 3 (three) times daily before meals. 04/05/21   Gatha Mayer, MD  ezetimibe (ZETIA) 10 MG tablet Take 10 mg by mouth every morning. Patient not taking: Reported on 11/09/2021    [provider]  famotidine (PEPCID) 20 MG tablet Take 20 mg by mouth daily.    [provider]  HYDROcodone-acetaminophen (NORCO) 7.5-325 MG tablet Take 1 tablet by mouth 2 (two) times daily as needed for severe pain. Patient not taking: Reported on 11/09/2021 03/15/20   [provider]  ipratropium (ATROVENT) 0.06 % nasal spray Place 2 sprays into both nostrils 3 (three) times daily as needed for rhinitis (runny nose). Patient taking differently: Place 2 sprays into both nostrils as needed for rhinitis (runny nose). 12/14/20   Garnet Sierras, DO  isosorbide mononitrate (IMDUR) 60 MG 24 hr tablet Take 60 mg by mouth at bedtime.    [provider]  losartan (COZAAR) 100 MG tablet Take 100 mg by mouth daily.    [provider]  MAGNESIUM PO Take 400 mg by mouth daily.    [provider]  montelukast (SINGULAIR) 10 MG tablet Take 1 tablet (10 mg total) by mouth at bedtime. 07/01/19   Tanda Rockers, MD  naproxen sodium (ALEVE) 220 MG  tablet Take 220-440 mg by mouth daily as needed (Pain). Patient not taking: Reported on 11/09/2021    [provider]  pantoprazole (PROTONIX) 40 MG tablet Take 1 tablet (40 mg total) by mouth daily. 12/14/19   Gatha Mayer, MD  Potassium 99 MG TABS Take 99 mg by mouth daily.    [provider]  Respiratory Therapy Supplies (FLUTTER) DEVI by Does not apply route as directed.    [provider]  rosuvastatin (CRESTOR) 5 MG tablet Take 1 tablet (5 mg total) by mouth daily. Patient not taking: Reported on 11/09/2021 03/15/21 03/15/22  Karoline Caldwell, PA-C  sertraline (ZOLOFT) 50 MG tablet Take 50 mg by mouth daily. 05/08/20   [provider]  Vitamin A  2400 MCG (8000 UT) CAPS Take 8,000 Units by mouth daily. Patient not taking: Reported on 11/09/2021    [provider]    Family History Family History  Problem Relation Age of Onset   Colitis Other        niece   Diabetes Mother    Heart disease Mother    Heart disease Father    Heart disease Brother    Rheumatic fever Sister    Colon cancer Neg Hx    Stomach cancer Neg Hx    Pancreatic cancer Neg Hx     Social History Social History   Tobacco Use   Smoking status: Former    Packs/day: 1.00    Years: 40.00    Pack years: 40.00    Types: Cigarettes    Quit date: 07/30/1976    Years since quitting: 45.3   Smokeless tobacco: Never  Vaping Use   Vaping Use: Never used  Substance Use Topics   Alcohol use: No   Drug use: No     Allergies   Depakote [divalproex sodium], Magnesium, Other, Propranolol, and Statins   Review of Systems Review of Systems No sore throat + cough No pleuritic pain No wheezing + nasal congestion No post-nasal drainage No sinus pain/pressure No itchy/red eyes No earache No hemoptysis No SOB No fever/chills No nausea No vomiting No abdominal pain No diarrhea No urinary symptoms No skin rash No fatigue No myalgias No headache   Physical  Exam Triage Vital Signs ED Triage Vitals  Enc Vitals Group     BP 11/09/21 1034 (!) 147/67     Pulse Rate 11/09/21 1034 69     Resp 11/09/21 1034 14     Temp 11/09/21 1034 97.8 F (36.6 C)     Temp Source 11/09/21 1034 Oral     SpO2 11/09/21 1034 96 %     Weight --      Height --      Head Circumference --      Peak Flow --      Pain Score 11/09/21 1035 0     Pain Loc --      Pain Edu? --      Excl. in [place]? --    No data found.  Updated Vital Signs BP (!) 147/67 (BP Location: Left Arm)   Pulse 69   Temp 97.8 F (36.6 C) (Oral)   Resp 14   SpO2 98%   Visual Acuity Right Eye Distance:   Left Eye Distance:   Bilateral Distance:    Right Eye Near:   Left Eye Near:    Bilateral Near:     Physical Exam Nursing notes and Vital Signs reviewed. Appearance:  Patient appears stated age, and in no acute distress Eyes:  Pupils are equal, round, and reactive to light and accomodation.  Extraocular movement is intact.  Conjunctivae are not inflamed  Ears:  Canals normal.  Tympanic membranes normal.  Nose:   Normal turbinates.  No sinus tenderness.   Pharynx:  Normal Neck:  Supple. No adenopathy. Lungs:  Clear to auscultation.  Breath sounds are equal.  Moving air well. Heart:  Regular rate and rhythm without murmurs, rubs, or gallops.  Abdomen:  Nontender without masses or hepatosplenomegaly.  Bowel sounds are present.  No CVA or flank tenderness.  Extremities:  No edema.  Skin:  No rash present.   UC Treatments / Results  Labs (all labs ordered are listed, but only abnormal results are displayed)  Labs Reviewed - No data to display  EKG   Radiology No results found.  Procedures Procedures (including critical care time)  Medications Ordered in UC Medications - No data to display  Initial Impression / Assessment and Plan / UC Course  I have reviewed the triage vital signs and the nursing notes.  Pertinent labs & imaging results that were available during my care  of the patient were reviewed by me and considered in my medical decision making (see chart for details).    Review of chart records reveals a normal chest x-ray done 10/22/21. ?pertussis.  Begin Bactrim DS and prednisone burst/taper. Followup with pulmonologist if not improved 10 days.  Final Clinical Impressions(s) / UC Diagnoses   Final diagnoses:  Persistent cough     Discharge Instructions      Take plain guaifenesin (1200mg  extended release tabs such as Mucinex) twice daily, with plenty of water, for cough and congestion.  Get adequate rest.   May take Delsym Cough Suppressant ("12 Hour Cough Relief") at bedtime for nighttime cough.  Stop all antihistamines (Nyquil, etc) for now, and other non-prescription cough/cold preparations.       ED Prescriptions     Medication Sig Dispense Auth. Provider   sulfamethoxazole-trimethoprim (BACTRIM DS) 800-160 MG tablet Take 1 tablet by mouth 2 (two) times daily. 20 tablet Kandra Nicolas, MD   predniSONE (DELTASONE) 20 MG tablet Take one tab by mouth twice daily for 4 days, then one daily for 3 days. Take with food. 11 tablet Kandra Nicolas, MD         Kandra Nicolas, MD 11/11/21 2139

## 2022-01-28 ENCOUNTER — Ambulatory Visit: Payer: Medicare Other

## 2022-01-28 ENCOUNTER — Inpatient Hospital Stay (HOSPITAL_COMMUNITY): Admission: RE | Admit: 2022-01-28 | Payer: Medicare Other | Source: Ambulatory Visit

## 2022-03-28 ENCOUNTER — Other Ambulatory Visit: Payer: Self-pay | Admitting: Neurological Surgery

## 2022-04-12 ENCOUNTER — Other Ambulatory Visit (HOSPITAL_COMMUNITY): Payer: Medicare Other

## 2022-04-15 ENCOUNTER — Other Ambulatory Visit (HOSPITAL_COMMUNITY): Payer: Medicare Other

## 2022-04-17 NOTE — Progress Notes (Signed)
Surgical Instructions ? ? ? Your procedure is scheduled on Monday, April 24th. ? Report to [facility] Main Entrance "A" at 5:30 A.M., then check in with the Admitting office. ? Call this number if you have problems the morning of surgery: ? [phone] ? ? If you have any questions prior to your surgery date call [phone]: Open Monday-Friday 8am-4pm ? ? ? Remember: ? Do not eat after midnight the night before your surgery ? ?You may drink clear liquids until 4:30 the morning of your surgery.   ?Clear liquids allowed are: Water, Non-Citrus Juices (without pulp), Carbonated Beverages, Clear Tea, Black Coffee ONLY (NO MILK, CREAM OR POWDERED CREAMER of any kind), and Gatorade ?  ? Take these medicines the morning of surgery with A SIP OF WATER:  ? Clonidine (Catapres) ? Dicyclomine (Bentyl) ? Ezetimibe (Zetia) ? Famotidine (Pepcid) ? Losartan (Cozaar) ? Pantoprazole (Protonix) ? Pravastatin (Pravachol) ? Prednisone ? Sertraline (Zoloft) ? Terbinafine (Lamisil) ? ? If needed: ? Hydrocodone-Acetaminophen ? ?Follow your surgeon's instructions on when to stop Aspirin & Plavix.  If no instructions were given by your surgeon then you will need to call the office to get those instructions.   ? ?As of today, STOP taking any Aspirin (unless otherwise instructed by your surgeon) Aleve, Naproxen, Ibuprofen, Motrin, Advil, Goody's, BC's, all herbal medications, fish oil, and all vitamins. ? ?WHAT DO I DO ABOUT MY DIABETES MEDICATION? ? ? ?Do not take oral diabetes medicines (pills) the morning of surgery. ? ? ?HOW TO MANAGE YOUR DIABETES ?BEFORE AND AFTER SURGERY ? ?Why is it important to control my blood sugar before and after surgery? ?Improving blood sugar levels before and after surgery helps healing and can limit problems. ?A way of improving blood sugar control is eating a healthy diet by: ? Eating less sugar and carbohydrates ? Increasing activity/exercise ? Talking with your doctor about reaching your blood sugar  goals ?High blood sugars (greater than 180 mg/dL) can raise your risk of infections and slow your recovery, so you will need to focus on controlling your diabetes during the weeks before surgery. ?Make sure that the doctor who takes care of your diabetes knows about your planned surgery including the date and location. ? ?How do I manage my blood sugar before surgery? ?Check your blood sugar at least 4 times a day, starting 2 days before surgery, to make sure that the level is not too high or low. ? ?Check your blood sugar the morning of your surgery when you wake up and every 2 hours until you get to the Short Stay unit. ? ?If your blood sugar is less than 70 mg/dL, you will need to treat for low blood sugar: ?Do not take insulin. ?Treat a low blood sugar (less than 70 mg/dL) with ? cup of clear juice (cranberry or apple), 4 glucose tablets, OR glucose gel. ?Recheck blood sugar in 15 minutes after treatment (to make sure it is greater than 70 mg/dL). If your blood sugar is not greater than 70 mg/dL on recheck, call [phone] for further instructions. ?Report your blood sugar to the short stay nurse when you get to Short Stay. ? ?If you are admitted to the hospital after surgery: ?Your blood sugar will be checked by the staff and you will probably be given insulin after surgery (instead of oral diabetes medicines) to make sure you have good blood sugar levels. ?The goal for blood sugar control after surgery is 80-180 mg/dL. ? ? ?DAY OF SURGERY:         ?  Do not wear jewelry  ?Do not wear lotions, powders, colognes, or deodorant. ?Men may shave face and neck. ?Do not bring valuables to the hospital. ? ?[facility] is not responsible for any belongings or valuables. .  ? ?Do NOT Smoke (Tobacco/Vaping)  24 hours prior to your procedure ? ?If you use a CPAP at night, you may bring your mask for your overnight stay. ?  ?Contacts, glasses, hearing aids, dentures or partials may not be worn into surgery, please bring  cases for these belongings ?  ?For patients admitted to the hospital, discharge time will be determined by your treatment team. ?  ?Patients discharged the day of surgery will not be allowed to drive home, and someone needs to stay with them for 24 hours. ? ? ?SURGICAL WAITING ROOM VISITATION ?Patients having surgery or a procedure in a hospital may have two support people. ?Children under the age of 55 must have an adult with them who is not the patient. ?They may stay in the waiting area during the procedure and may switch out with other visitors. If the patient needs to stay at the hospital during part of their recovery, the visitor guidelines for inpatient rooms apply. ? ?Please refer to the [facility] website for the visitor guidelines for Inpatients (after your surgery is over and you are in a regular room).  ? ? ?Special instructions:   ? ?Oral Hygiene is also important to reduce your risk of infection.  Remember - BRUSH YOUR TEETH THE MORNING OF SURGERY WITH YOUR REGULAR TOOTHPASTE ? ? ?[facility]- Preparing For Surgery ? ?Before surgery, you can play an important role. Because skin is not sterile, your skin needs to be as free of germs as possible. You can reduce the number of germs on your skin by washing with CHG (chlorahexidine gluconate) Soap before surgery.  CHG is an antiseptic cleaner which kills germs and bonds with the skin to continue killing germs even after washing.   ? ? ?Please do not use if you have an allergy to CHG or antibacterial soaps. If your skin becomes reddened/irritated stop using the CHG.  ?Do not shave (including legs and underarms) for at least 48 hours prior to first CHG shower. It is OK to shave your face. ? ?Please follow these instructions carefully. ?  ? ? Shower the NIGHT BEFORE SURGERY and the MORNING OF SURGERY with CHG Soap.  ? If you chose to wash your hair, wash your hair first as usual with your normal shampoo. After you shampoo, rinse your hair and body thoroughly  to remove the shampoo.  Then [employer] and genitals (private parts) with your normal soap and rinse thoroughly to remove soap. ? ?After that Use CHG Soap as you would any other liquid soap. You can apply CHG directly to the skin and wash gently with a scrungie or a clean washcloth.  ? ?Apply the CHG Soap to your body ONLY FROM THE NECK DOWN.  Do not use on open wounds or open sores. Avoid contact with your eyes, ears, mouth and genitals (private parts). Wash Face and genitals (private parts)  with your normal soap.  ? ?Wash thoroughly, paying special attention to the area where your surgery will be performed. ? ?Thoroughly rinse your body with warm water from the neck down. ? ?DO NOT shower/wash with your normal soap after using and rinsing off the CHG Soap. ? ?Pat yourself dry with a CLEAN TOWEL. ? ?Wear CLEAN PAJAMAS to bed the night  before surgery ? ?Place CLEAN SHEETS on your bed the night before your surgery ? ?DO NOT SLEEP WITH PETS. ? ? ?Day of Surgery: ? ?Take a shower with CHG soap. ?Wear Clean/Comfortable clothing the morning of surgery ?Do not apply any deodorants/lotions.   ?Remember to brush your teeth WITH YOUR REGULAR TOOTHPASTE. ? ? ?Please read over the following fact sheets that you were given.  ? ?

## 2022-04-18 ENCOUNTER — Other Ambulatory Visit: Payer: Self-pay

## 2022-04-18 ENCOUNTER — Encounter (HOSPITAL_COMMUNITY): Payer: Self-pay

## 2022-04-18 ENCOUNTER — Encounter (HOSPITAL_COMMUNITY)
Admission: RE | Admit: 2022-04-18 | Discharge: 2022-04-18 | Disposition: A | Discharge status: Home / Self Care | Payer: Medicare Other | Source: Ambulatory Visit | Attending: Neurological Surgery | Admitting: Neurological Surgery

## 2022-04-18 VITALS — BP 111/65 | HR 71 | Temp 97.8°F | Resp 17 | Ht 65.0 in | Wt 179.7 lb

## 2022-04-18 DIAGNOSIS — G4733 Obstructive sleep apnea (adult) (pediatric): Secondary | ICD-10-CM | POA: Diagnosis not present

## 2022-04-18 DIAGNOSIS — Z951 Presence of aortocoronary bypass graft: Secondary | ICD-10-CM | POA: Insufficient documentation

## 2022-04-18 DIAGNOSIS — I252 Old myocardial infarction: Secondary | ICD-10-CM | POA: Diagnosis not present

## 2022-04-18 DIAGNOSIS — Q631 Lobulated, fused and horseshoe kidney: Secondary | ICD-10-CM | POA: Insufficient documentation

## 2022-04-18 DIAGNOSIS — Z87891 Personal history of nicotine dependence: Secondary | ICD-10-CM | POA: Diagnosis not present

## 2022-04-18 DIAGNOSIS — J449 Chronic obstructive pulmonary disease, unspecified: Secondary | ICD-10-CM | POA: Insufficient documentation

## 2022-04-18 DIAGNOSIS — M4316 Spondylolisthesis, lumbar region: Secondary | ICD-10-CM | POA: Insufficient documentation

## 2022-04-18 DIAGNOSIS — Z7901 Long term (current) use of anticoagulants: Secondary | ICD-10-CM | POA: Diagnosis not present

## 2022-04-18 DIAGNOSIS — E785 Hyperlipidemia, unspecified: Secondary | ICD-10-CM | POA: Insufficient documentation

## 2022-04-18 DIAGNOSIS — Z01812 Encounter for preprocedural laboratory examination: Secondary | ICD-10-CM | POA: Diagnosis present

## 2022-04-18 DIAGNOSIS — Z01818 Encounter for other preprocedural examination: Secondary | ICD-10-CM

## 2022-04-18 DIAGNOSIS — I110 Hypertensive heart disease with heart failure: Secondary | ICD-10-CM | POA: Diagnosis not present

## 2022-04-18 DIAGNOSIS — I25810 Atherosclerosis of coronary artery bypass graft(s) without angina pectoris: Secondary | ICD-10-CM | POA: Diagnosis not present

## 2022-04-18 HISTORY — DX: Prediabetes: R73.03

## 2022-04-18 LAB — SURGICAL PCR SCREEN
MRSA, PCR: NEGATIVE
Staphylococcus aureus: NEGATIVE

## 2022-04-18 LAB — CBC
HCT: 48.6 % (ref 39.0–52.0)
Hemoglobin: 15.6 g/dL (ref 13.0–17.0)
MCH: 30.1 pg (ref 26.0–34.0)
MCHC: 32.1 g/dL (ref 30.0–36.0)
MCV: 93.6 fL (ref 80.0–100.0)
Platelets: 276 10*3/uL (ref 150–400)
RBC: 5.19 MIL/uL (ref 4.22–5.81)
RDW: 13.4 % (ref 11.5–15.5)
WBC: 9.8 10*3/uL (ref 4.0–10.5)
nRBC: 0 % (ref 0.0–0.2)

## 2022-04-18 LAB — BASIC METABOLIC PANEL
Anion gap: 7 (ref 5–15)
BUN: 26 mg/dL — ABNORMAL HIGH (ref 8–23)
CO2: 25 mmol/L (ref 22–32)
Calcium: 9.4 mg/dL (ref 8.9–10.3)
Chloride: 108 mmol/L (ref 98–111)
Creatinine, Ser: 1.46 mg/dL — ABNORMAL HIGH (ref 0.61–1.24)
GFR, Estimated: 49 mL/min — ABNORMAL LOW (ref >60)
Glucose, Bld: 93 mg/dL (ref 70–99)
Potassium: 4.7 mmol/L (ref 3.5–5.1)
Sodium: 140 mmol/L (ref 135–145)

## 2022-04-18 NOTE — Progress Notes (Signed)
PCP:  Pablo Lawrence ?Cardiologist:  Valetta Fuller, MD ? ?EKG: 07/04/21 ?CXR:  10/13/21 ?ECHO:  07/11/21 ?Stress Test:  denies ?Cardiac Cath: 12/09/19 ? ?Fasting Blood Sugar- does not check glucose.  Pre-diabetes ?Checks Blood Sugar_0__ times a day ? ?ASA/Plavix:  Patient to call surgeon's office today for instructions ? ?OSA: Yes ?CPAP: No ? ?Covid test not needed ? ?Anesthesia Review: yes, cardiac history ? ?Patient denies shortness of breath, fever, cough, and chest pain at PAT appointment. ? ?Patient verbalized understanding of instructions provided today at the PAT appointment.  Patient asked to review instructions at home and day of surgery.   ?

## 2022-04-19 ENCOUNTER — Encounter (HOSPITAL_COMMUNITY): Payer: Self-pay | Admitting: Neurological Surgery

## 2022-04-19 NOTE — Progress Notes (Addendum)
Anesthesia Chart Review: ? Case: [id number] Date/Time: 04/22/22 0715  ? Procedure: L4-5, L5-S1 Minimally invasive laminectomies and TLIF - 3C/RM 20  ? Anesthesia type: General  ? Pre-op diagnosis: Spondylolisthesis, Lumbar region  ? Location: [facility] ROOM 20 / [facility] OR  ? Surgeons: Judith Part, MD  ? ?  ? ? ?DISCUSSION: Patient is a 81 year old male scheduled for the above procedure. ? ?History includes former smoker (quit 07/30/76), COPD, tracheobronchomalacia, dyspnea (chronic), OSA (intolerant to CPAP), CAD (multiple PCI prior to off-pump CABG x1: LIMA-LAD 07/11/09; MI, occluded LIMA-LAD, s/p DES LAD just after D1 09/07/09; patient LAD stent, subtotal occlusion CX not amendable to PCI 12/09/19, medical therapy), HTN, HLD, pre-diabetes, carotid artery disease (s/p left carotid endarterectomy 03/14/21), horseshoe kidney, melanoma, brain surgery (coiling ACA aneurysm 2004), headaches (s/p greater occipital nerve blocks for chronic HA/occiptial neuralgia; 12/14/20 by Dorene Ar, MD), short-term memory loss. Reported "hard to wake up" after anesthesia. ? ?Last cardiology follow-up with Dr. Prince Rome on 08/24/21 four routine and hospital follow-up. He had a 2 day admission 06/2021 due to presyncope in setting of dehydration, AKI (Cr 2.0) and hypotension. No acute findings on CXR, CT scan abd/pelvis, or brain MRI. EKG with SR, PVCs. Echo with LVEF 55-60%, mild MR, mild-moderate TR, RVSP mildly elevated. No significant stenosis on right carotid US. AKI and BP improved with IVF. At his cardiology visit, he had been doing well since discharge. Chronic SOB which was stable. No angina, and continued medical therapy recommended. HTN well controlled. Continued Zetia and Pravachol for HLD. ? ?Last PCP visit 03/13/21 for annual wellness visit. He was referred to neurosurgery.  ?  ?I called and spoke with Mr. Renteria on 04/19/22. He denied any new symptoms since his last cardiology evaluation in August. He DOE is stable. No chest pain. Even  with back issues, he is able to still do what he needs to get done, but has to do activity in "moderation.". No further presyncope since his 06/2021 admission. He was given permission to temporarily hold Plavix for previous carotid surgery. He is on boith ASA and Plavix and is waiting for surgeon to provide instructions--last dose 04/18/22 nighttime. ? ?I called and spoke with Lorriane Shire at Dr. Colleen Can office to let her know patient says he was not instructed to hold ASA or Plavix, so currently last dose 04/18/22 PM. She reviewed with Dr. Zada Finders. He says patient has foot drop and case is not considered elective, so he would advise patient to hold Plavix and ASA starting 04/19/22 with anticipation to proceed with surgery as planned on 04/22/22. Their staff will follow-up with patient. Anesthesia team to evaluate on the day of surgery.   ? ? ?VS: BP 111/65   Pulse 71   Temp 36.6 ?C (Oral)   Resp 17   Ht '5\' 5"'$  (1.651 m)   Wt 81.5 kg   SpO2 100%   BMI 29.90 kg/m?  ? ? ?PROVIDERS: ?[facility], [place]; [place], Loma Sousa, NP is PCP ?- Valetta Fuller, MD is cardiologist ([place]) ?Christinia Gully, MD is pulmonologist. Last visit with Rexene Edison, NP 04/04/20. Referred to allergist for cough, suspected allergic component. Improved compliance with CPAP recommended.  ?- Tommas Olp, MD is neurologist ([place]) ?Rexene Alberts, DO is allergist ?- Napoleon Form, MD is vascular surgeon. Last visit 04/02/21 with Julio Sicks, PA-C. ? ? ?LABS: Preoperative labs noted. Cr 1.46, previously ~ 1.0-1.20.  ?(all labs ordered are listed, but  only abnormal results are displayed) ? ?Labs Reviewed  ?BASIC METABOLIC PANEL - Abnormal; Notable for the following components:  ?    Result Value  ? BUN 26 (*)   ? Creatinine, Ser 1.46 (*)   ? GFR, Estimated 49 (*)   ? All other components within normal limits  ?SURGICAL PCR SCREEN  ?CBC  ?TYPE AND SCREEN  ? ? ?PFTs 09/08/19:  "FEV1  2.00 (82 % ) ratio 0.81  p 3 % improvement from saba p nothing prior to study with DLCO  19.99 (93%) corrects to 4.67 (115 %)  for alv volume and FV curve nl   ERV 28%" ?  ?  ?IMAGES: ?MRI L-spine 02/14/22 ([facility]): ?IMPRESSION:  ?- L4-5: Moderate DDD. Severe bilateral facet arthropathy. Mild anterolisthesis. 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 Moderate bilateral neural foraminal stenosis. These degenerative changes have progressed since 02/21/2004.  ?- L5-S1: Moderate DDD. Severe bilateral facet arthropathy. Mild anterolisthesis. Moderately severe central zone stenosis. Severe bilateral subarticular zone stenosis. Moderate bilateral neural foraminal stenosis. These degenerative changes have progressed since 02/21/2004.  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 The clinical significance of this finding (if any) in the setting of a normal level of termination of the conus medullaris is indeterminate, but this can sometimes be associated with clinical signs/symptoms of spinal cord tethering.   ? ?CXR 10/22/21 ([facility]): ?FINDINGS: The lungs are clear. Heart size normal. No acute skeletal abnormalities. Coronary artery bypass graft.  ?IMPRESSION: Normal chest.  ? ?MRI Head 07/11/21 ([facility]): ?IMPRESSION:  ?1. Horseshoe kidney. Punctate nonobstructing right renal calculus. No hydronephrosis or other findings of obstructive uropathy.  ?2. Bilateral fat-containing inguinal hernias, small in the right and moderate on the left. The left is also larger as compared to 2018. There are also incompletely visualized scrotal hydroceles.  ?3. Diverticulosis. No findings of acute diverticulitis.  ?4. Pancreatic calcifications are compatible with prior episodes of pancreatitis. Currently no findings of acute inflammation.  ? ?CT abd/pelvis  07/10/21 ([facility]): ?IMPRESSION:  ?1. Horseshoe kidney. Punctate nonobstructing right renal calculus. No hydronephrosis or other findings of obstructive uropathy.  ?2. Bilateral fat-containing inguinal hernias, small in the right and moderate on the left. The left is also larger as compared to 2018. There are also incompletely visualized scrotal hydroceles.  ?3. Diverticulosis. No findings of acute diverticulitis.  ?4. Pancreatic calcifications are compatible with prior episodes of pancreatitis. Currently no findings of acute inflammation.  ?  ? ?EKG:  ?EKG 07/04/21: SR with occasional PVCs and PACs ?  ?  ?CV: ?Echo 07/11/21 ([facility]): ?Impression: ?Left Ventricle: Systolic function is normal. EF: 55-60%.  ?  Mitral Valve: There is mild regurgitation.  ?  Tricuspid Valve: There is mild to moderate regurgitation.  ?  Tricuspid Valve: The right ventricular systolic pressure is mildly  ?elevated (37-49 mmHg). ? ? ?[place] Right Carotid 07/11/21 ([facility]): ?IMPRESSION:  ?Right bifurcation plaque. No significant stenosis.  ? ? ?Cardiac cath 12/09/19 ([facility]): ?Coronary Angiography  ?1. Left Main -widely patent  ?2. Left anterior descending artery -there is a stent in the proximal  ?portion.  There is mild 10 to 20% stenosis within the stent  ?3. Left Circumflex -the circumflex is subtotally occluded and fills late.    ?There is an obtuse marginal artery that trifurcates and has 50% stenosis  ?just  prior to the trifurcation.  ?4. Right Coronary Artery -diffusely small.  The PDA and PLV branches are  ?widely patent  ?5.  He had a previous LIMA to the LAD which is known to be occluded and  ?was not injected today  ? ?Additional comments on angiography: Right dominant  ? ?Hemodynamics  ?1. Aortic Pressure -124/56 mmHg  ?2. Left Ventricular -131/18 mmHg  ? ?CONCLUSIONS:  ?1. LAD stent is patent  ?2. His circumflex is subtotally occluded and not amenable to PCI.  The  ?other disease is nonobstructive  ?3. We will plan medical  therapy ?  ?  ?Past Medical History:  ?Diagnosis Date  ? Adenomatous colon polyp   ? Barrett esophagus   ? CAD (coronary artery disease)   ? Chronic headaches   ? Complication of anesthesia   ? Hard to wake

## 2022-04-19 NOTE — Anesthesia Preprocedure Evaluation (Addendum)
Anesthesia Evaluation  ?Patient identified by MRN, date of birth, ID band ?Patient awake ? ? ? ?Reviewed: ?Allergy & Precautions, NPO status , Patient's Chart, lab work & pertinent test results ? ?History of Anesthesia Complications ?(+) PROLONGED EMERGENCE and history of anesthetic complications ? ?Airway ?Mallampati: II ? ?TM Distance: >3 FB ?Neck ROM: Full ? ? ? Dental ? ?(+) Edentulous Lower, Edentulous Upper ?  ?Pulmonary ?neg pulmonary ROS, asthma , sleep apnea , COPD, former smoker,  ?tracheobroncholmalacia ?  ?Pulmonary exam normal ?breath sounds clear to auscultation ? ? ? ? ? ? Cardiovascular ?hypertension, + CAD  ?negative cardio ROS ?Normal cardiovascular exam ?Rhythm:Regular Rate:Normal ? ? ?  ?Neuro/Psych ? Headaches, negative neurological ROS ? negative psych ROS  ? GI/Hepatic ?negative GI ROS, Neg liver ROS, GERD  ,  ?Endo/Other  ?negative endocrine ROS ? Renal/GU ?Renal disease (horseshoe kidney)negative Renal ROS  ?negative genitourinary ?  ?Musculoskeletal ?negative musculoskeletal ROS ?(+)  ? Abdominal ?  ?Peds ?negative pediatric ROS ?(+)  Hematology ?negative hematology ROS ?(+)   ?Anesthesia Other Findings ? ? Reproductive/Obstetrics ?negative OB ROS ? ?  ? ? ? ? ? ? ? ? ? ? ? ? ? ?  ?  ? ? ? ? ? ? ?Anesthesia Physical ?Anesthesia Plan ? ?ASA: 3 ? ?Anesthesia Plan: General  ? ?Post-op Pain Management: Tylenol PO (pre-op)*  ? ?Induction: Intravenous ? ?PONV Risk Score and Plan: 2 and Treatment may vary due to age or medical condition, Ondansetron and Dexamethasone ? ?Airway Management Planned: Oral ETT ? ?Additional Equipment: Arterial line ? ?Intra-op Plan:  ? ?Post-operative Plan: Extubation in OR ? ?Informed Consent:  ? ?Plan Discussed with:  ? ?Anesthesia Plan Comments: (Last doses of ASA/Plavix was 04/18/22. Dr. Zada Finders was made aware and is choosing to proceed with surgery on 04/22/22 stating patieng is experiencing foot drop and needs to have the  surgery. We will place arterial line and set-up 2 units of blood. Norton Blizzard, MD  ? ? ? ? ?See PAT note written 04/19/2022 by Myra Gianotti, PA-C. ?)  ? ? ? ? ? ?Anesthesia Quick Evaluation ? ?

## 2022-04-21 ENCOUNTER — Encounter (HOSPITAL_COMMUNITY): Payer: Self-pay | Admitting: Neurological Surgery

## 2022-04-22 ENCOUNTER — Inpatient Hospital Stay (HOSPITAL_COMMUNITY): Payer: Medicare Other | Admitting: Vascular Surgery

## 2022-04-22 ENCOUNTER — Other Ambulatory Visit: Payer: Self-pay

## 2022-04-22 ENCOUNTER — Inpatient Hospital Stay (HOSPITAL_COMMUNITY): Payer: Medicare Other | Admitting: Certified Registered Nurse Anesthetist

## 2022-04-22 ENCOUNTER — Inpatient Hospital Stay (HOSPITAL_COMMUNITY)
Admission: RE | Disposition: A | Discharge status: Home / Self Care | Payer: Self-pay | Source: Home / Self Care | Attending: Neurological Surgery

## 2022-04-22 ENCOUNTER — Inpatient Hospital Stay (HOSPITAL_COMMUNITY): Payer: Medicare Other

## 2022-04-22 ENCOUNTER — Encounter (HOSPITAL_COMMUNITY): Payer: Self-pay | Admitting: Neurological Surgery

## 2022-04-22 ENCOUNTER — Inpatient Hospital Stay (HOSPITAL_COMMUNITY)
Admission: RE | Admit: 2022-04-22 | Discharge: 2022-04-23 | DRG: 460 | Disposition: A | Discharge status: Home / Self Care | Payer: Medicare Other | Attending: Neurological Surgery | Admitting: Neurological Surgery

## 2022-04-22 DIAGNOSIS — I10 Essential (primary) hypertension: Secondary | ICD-10-CM | POA: Diagnosis present

## 2022-04-22 DIAGNOSIS — M4316 Spondylolisthesis, lumbar region: Secondary | ICD-10-CM | POA: Diagnosis present

## 2022-04-22 DIAGNOSIS — Z85820 Personal history of malignant melanoma of skin: Secondary | ICD-10-CM

## 2022-04-22 DIAGNOSIS — Z833 Family history of diabetes mellitus: Secondary | ICD-10-CM

## 2022-04-22 DIAGNOSIS — Z8249 Family history of ischemic heart disease and other diseases of the circulatory system: Secondary | ICD-10-CM

## 2022-04-22 DIAGNOSIS — R7303 Prediabetes: Secondary | ICD-10-CM | POA: Diagnosis present

## 2022-04-22 DIAGNOSIS — J449 Chronic obstructive pulmonary disease, unspecified: Secondary | ICD-10-CM | POA: Diagnosis not present

## 2022-04-22 DIAGNOSIS — Z87891 Personal history of nicotine dependence: Secondary | ICD-10-CM

## 2022-04-22 DIAGNOSIS — M48061 Spinal stenosis, lumbar region without neurogenic claudication: Secondary | ICD-10-CM

## 2022-04-22 DIAGNOSIS — K219 Gastro-esophageal reflux disease without esophagitis: Secondary | ICD-10-CM | POA: Diagnosis present

## 2022-04-22 DIAGNOSIS — E785 Hyperlipidemia, unspecified: Secondary | ICD-10-CM | POA: Diagnosis not present

## 2022-04-22 DIAGNOSIS — G4730 Sleep apnea, unspecified: Secondary | ICD-10-CM | POA: Diagnosis present

## 2022-04-22 DIAGNOSIS — M5416 Radiculopathy, lumbar region: Secondary | ICD-10-CM | POA: Diagnosis not present

## 2022-04-22 DIAGNOSIS — M48062 Spinal stenosis, lumbar region with neurogenic claudication: Principal | ICD-10-CM | POA: Diagnosis present

## 2022-04-22 DIAGNOSIS — G9600 Cerebrospinal fluid leak, unspecified: Secondary | ICD-10-CM | POA: Diagnosis not present

## 2022-04-22 DIAGNOSIS — I2510 Atherosclerotic heart disease of native coronary artery without angina pectoris: Secondary | ICD-10-CM | POA: Diagnosis present

## 2022-04-22 HISTORY — PX: TRANSFORAMINAL LUMBAR INTERBODY FUSION W/ MIS 2 LEVEL: SHX6146

## 2022-04-22 LAB — GLUCOSE, CAPILLARY: Glucose-Capillary: 133 mg/dL — ABNORMAL HIGH (ref 70–99)

## 2022-04-22 LAB — PREPARE RBC (CROSSMATCH)

## 2022-04-22 SURGERY — MINIMALLY INVASIVE (MIS) TRANSFORAMINAL LUMBAR INTERBODY FUSION (TLIF) 2 LEVEL
Anesthesia: General | Site: Spine Lumbar

## 2022-04-22 MED ORDER — PROPOFOL 10 MG/ML IV BOLUS
INTRAVENOUS | Status: AC
  Filled 2022-04-22: qty 20

## 2022-04-22 MED ORDER — PROPOFOL 10 MG/ML IV BOLUS
INTRAVENOUS | Status: DC | PRN
  Administered 2022-04-22: 150 mg via INTRAVENOUS
  Administered 2022-04-22 (×2): 20 mg via INTRAVENOUS

## 2022-04-22 MED ORDER — MENTHOL 3 MG MT LOZG: 1.0000 | LOZENGE | OROMUCOSAL | Status: DC | PRN

## 2022-04-22 MED ORDER — OXYCODONE-ACETAMINOPHEN 5-325 MG PO TABS
2.0000 | ORAL_TABLET | ORAL | Status: DC | PRN
  Administered 2022-04-22 – 2022-04-23 (×4): 2 via ORAL
  Filled 2022-04-22 (×4): qty 2

## 2022-04-22 MED ORDER — ACETAMINOPHEN 650 MG RE SUPP: 650.0000 mg | RECTAL | Status: DC | PRN

## 2022-04-22 MED ORDER — SUGAMMADEX SODIUM 200 MG/2ML IV SOLN
INTRAVENOUS | Status: DC | PRN
  Administered 2022-04-22: 200 mg via INTRAVENOUS

## 2022-04-22 MED ORDER — ONDANSETRON HCL 4 MG/2ML IJ SOLN: 4.0000 mg | Freq: Four times a day (QID) | INTRAMUSCULAR | Status: DC | PRN

## 2022-04-22 MED ORDER — LIDOCAINE-EPINEPHRINE 1 %-1:100000 IJ SOLN
INTRAMUSCULAR | Status: DC | PRN
  Administered 2022-04-22: 10 mL

## 2022-04-22 MED ORDER — FENTANYL CITRATE (PF) 250 MCG/5ML IJ SOLN
INTRAMUSCULAR | Status: AC
Start: 2022-04-22 — End: ?
  Filled 2022-04-22: qty 5

## 2022-04-22 MED ORDER — ONDANSETRON HCL 4 MG/2ML IJ SOLN
INTRAMUSCULAR | Status: DC | PRN
  Administered 2022-04-22 (×2): 4 mg via INTRAVENOUS

## 2022-04-22 MED ORDER — DEXAMETHASONE SODIUM PHOSPHATE 10 MG/ML IJ SOLN
INTRAMUSCULAR | Status: DC | PRN
  Administered 2022-04-22: 10 mg via INTRAVENOUS

## 2022-04-22 MED ORDER — CHLORHEXIDINE GLUCONATE CLOTH 2 % EX PADS: 6.0000 | MEDICATED_PAD | Freq: Once | CUTANEOUS | Status: DC

## 2022-04-22 MED ORDER — SUGAMMADEX SODIUM 200 MG/2ML IV SOLN: INTRAVENOUS | Status: DC | PRN

## 2022-04-22 MED ORDER — LOSARTAN POTASSIUM 50 MG PO TABS
100.0000 mg | ORAL_TABLET | Freq: Every day | ORAL | Status: DC
  Administered 2022-04-22 – 2022-04-23 (×2): 100 mg via ORAL
  Filled 2022-04-22 (×2): qty 2

## 2022-04-22 MED ORDER — THROMBIN 5000 UNITS EX SOLR: OROMUCOSAL | Status: DC | PRN

## 2022-04-22 MED ORDER — DEXAMETHASONE SODIUM PHOSPHATE 10 MG/ML IJ SOLN
INTRAMUSCULAR | Status: AC
  Filled 2022-04-22: qty 1

## 2022-04-22 MED ORDER — FENTANYL CITRATE (PF) 100 MCG/2ML IJ SOLN
INTRAMUSCULAR | Status: AC
  Filled 2022-04-22: qty 2

## 2022-04-22 MED ORDER — OXYCODONE HCL 5 MG PO TABS: 5.0000 mg | ORAL_TABLET | ORAL | Status: DC | PRN

## 2022-04-22 MED ORDER — CEFAZOLIN SODIUM-DEXTROSE 2-4 GM/100ML-% IV SOLN
2.0000 g | INTRAVENOUS | Status: AC
  Administered 2022-04-22: 2 g via INTRAVENOUS

## 2022-04-22 MED ORDER — DICYCLOMINE HCL 20 MG PO TABS
20.0000 mg | ORAL_TABLET | Freq: Three times a day (TID) | ORAL | Status: DC
  Administered 2022-04-22: 20 mg via ORAL
  Filled 2022-04-22 (×3): qty 1

## 2022-04-22 MED ORDER — TERBINAFINE HCL 250 MG PO TABS
250.0000 mg | ORAL_TABLET | Freq: Every day | ORAL | Status: DC
  Administered 2022-04-22 – 2022-04-23 (×2): 250 mg via ORAL
  Filled 2022-04-22 (×2): qty 1

## 2022-04-22 MED ORDER — OXYCODONE HCL 5 MG PO TABS
10.0000 mg | ORAL_TABLET | ORAL | Status: DC | PRN
  Administered 2022-04-22: 10 mg via ORAL
  Filled 2022-04-22: qty 2

## 2022-04-22 MED ORDER — HYDROMORPHONE HCL 1 MG/ML IJ SOLN
1.0000 mg | INTRAMUSCULAR | Status: DC | PRN
  Administered 2022-04-22 – 2022-04-23 (×2): 1 mg via INTRAVENOUS
  Filled 2022-04-22 (×2): qty 1

## 2022-04-22 MED ORDER — OXYCODONE HCL 5 MG PO TABS
5.0000 mg | ORAL_TABLET | Freq: Once | ORAL | Status: AC | PRN
  Administered 2022-04-22: 5 mg via ORAL

## 2022-04-22 MED ORDER — SODIUM CHLORIDE 0.9% FLUSH
3.0000 mL | Freq: Two times a day (BID) | INTRAVENOUS | Status: DC
  Administered 2022-04-22 (×2): 3 mL via INTRAVENOUS

## 2022-04-22 MED ORDER — ONDANSETRON HCL 4 MG/2ML IJ SOLN
INTRAMUSCULAR | Status: AC
  Filled 2022-04-22: qty 2

## 2022-04-22 MED ORDER — FLUTTER DEVI: Status: DC

## 2022-04-22 MED ORDER — ONDANSETRON HCL 4 MG/2ML IJ SOLN: 4.0000 mg | Freq: Once | INTRAMUSCULAR | Status: DC | PRN

## 2022-04-22 MED ORDER — EPHEDRINE SULFATE-NACL 50-0.9 MG/10ML-% IV SOSY
PREFILLED_SYRINGE | INTRAVENOUS | Status: DC | PRN
  Administered 2022-04-22: 10 mg via INTRAVENOUS

## 2022-04-22 MED ORDER — CYCLOBENZAPRINE HCL 10 MG PO TABS
10.0000 mg | ORAL_TABLET | Freq: Three times a day (TID) | ORAL | Status: DC | PRN
  Administered 2022-04-22 (×2): 10 mg via ORAL
  Filled 2022-04-22 (×2): qty 1

## 2022-04-22 MED ORDER — ACETAMINOPHEN 500 MG PO TABS
ORAL_TABLET | ORAL | Status: AC
Start: 2022-04-22 — End: 2022-04-22
  Administered 2022-04-22: 1000 mg via ORAL
  Filled 2022-04-22: qty 2

## 2022-04-22 MED ORDER — MONTELUKAST SODIUM 10 MG PO TABS
10.0000 mg | ORAL_TABLET | Freq: Every day | ORAL | Status: DC
  Administered 2022-04-22: 10 mg via ORAL
  Filled 2022-04-22: qty 1

## 2022-04-22 MED ORDER — SERTRALINE HCL 50 MG PO TABS
50.0000 mg | ORAL_TABLET | Freq: Every day | ORAL | Status: DC
  Administered 2022-04-22 – 2022-04-23 (×2): 50 mg via ORAL
  Filled 2022-04-22 (×2): qty 1

## 2022-04-22 MED ORDER — FENTANYL CITRATE (PF) 100 MCG/2ML IJ SOLN
25.0000 ug | INTRAMUSCULAR | Status: DC | PRN
  Administered 2022-04-22 (×3): 25 ug via INTRAVENOUS

## 2022-04-22 MED ORDER — SUCCINYLCHOLINE CHLORIDE 200 MG/10ML IV SOSY
PREFILLED_SYRINGE | INTRAVENOUS | Status: AC
  Filled 2022-04-22: qty 10

## 2022-04-22 MED ORDER — LIDOCAINE-EPINEPHRINE 1 %-1:100000 IJ SOLN
INTRAMUSCULAR | Status: AC
  Filled 2022-04-22: qty 1

## 2022-04-22 MED ORDER — SODIUM CHLORIDE 0.9 % IV SOLN
250.0000 mL | INTRAVENOUS | Status: DC
  Administered 2022-04-22: 250 mL via INTRAVENOUS

## 2022-04-22 MED ORDER — LIDOCAINE 2% (20 MG/ML) 5 ML SYRINGE
INTRAMUSCULAR | Status: DC | PRN
  Administered 2022-04-22: 80 mg via INTRAVENOUS

## 2022-04-22 MED ORDER — PHENYLEPHRINE HCL-NACL 20-0.9 MG/250ML-% IV SOLN
INTRAVENOUS | Status: DC | PRN
  Administered 2022-04-22: 25 ug/min via INTRAVENOUS

## 2022-04-22 MED ORDER — OXYCODONE HCL 5 MG/5ML PO SOLN: 5.0000 mg | Freq: Once | ORAL | Status: AC | PRN

## 2022-04-22 MED ORDER — EZETIMIBE 10 MG PO TABS
10.0000 mg | ORAL_TABLET | Freq: Every day | ORAL | Status: DC
  Administered 2022-04-22 – 2022-04-23 (×2): 10 mg via ORAL
  Filled 2022-04-22 (×2): qty 1

## 2022-04-22 MED ORDER — FAMOTIDINE 20 MG PO TABS
20.0000 mg | ORAL_TABLET | Freq: Every day | ORAL | Status: DC
  Administered 2022-04-22 – 2022-04-23 (×2): 20 mg via ORAL
  Filled 2022-04-22 (×2): qty 1

## 2022-04-22 MED ORDER — ASCORBIC ACID 500 MG PO TABS
1000.0000 mg | ORAL_TABLET | Freq: Every day | ORAL | Status: DC
  Administered 2022-04-22 – 2022-04-23 (×2): 1000 mg via ORAL
  Filled 2022-04-22 (×2): qty 2

## 2022-04-22 MED ORDER — ACETAMINOPHEN 500 MG PO TABS: 1000.0000 mg | ORAL_TABLET | Freq: Once | ORAL | Status: AC

## 2022-04-22 MED ORDER — ONDANSETRON HCL 4 MG PO TABS: 4.0000 mg | ORAL_TABLET | Freq: Four times a day (QID) | ORAL | Status: DC | PRN

## 2022-04-22 MED ORDER — OXYCODONE HCL 5 MG PO TABS
ORAL_TABLET | ORAL | Status: AC
  Filled 2022-04-22: qty 1

## 2022-04-22 MED ORDER — PHENOL 1.4 % MT LIQD: 1.0000 | OROMUCOSAL | Status: DC | PRN

## 2022-04-22 MED ORDER — SODIUM CHLORIDE 0.9% FLUSH: 3.0000 mL | INTRAVENOUS | Status: DC | PRN

## 2022-04-22 MED ORDER — LACTATED RINGERS IV SOLN: INTRAVENOUS | Status: DC | PRN

## 2022-04-22 MED ORDER — DOCUSATE SODIUM 100 MG PO CAPS
100.0000 mg | ORAL_CAPSULE | Freq: Two times a day (BID) | ORAL | Status: DC
  Administered 2022-04-22 – 2022-04-23 (×3): 100 mg via ORAL
  Filled 2022-04-22 (×3): qty 1

## 2022-04-22 MED ORDER — AMISULPRIDE (ANTIEMETIC) 5 MG/2ML IV SOLN: 10.0000 mg | Freq: Once | INTRAVENOUS | Status: DC | PRN

## 2022-04-22 MED ORDER — CEFAZOLIN SODIUM-DEXTROSE 2-4 GM/100ML-% IV SOLN
2.0000 g | Freq: Three times a day (TID) | INTRAVENOUS | Status: AC
  Administered 2022-04-22 (×2): 2 g via INTRAVENOUS
  Filled 2022-04-22 (×2): qty 100

## 2022-04-22 MED ORDER — PHENYLEPHRINE 80 MCG/ML (10ML) SYRINGE FOR IV PUSH (FOR BLOOD PRESSURE SUPPORT)
PREFILLED_SYRINGE | INTRAVENOUS | Status: DC | PRN
  Administered 2022-04-22 (×2): 160 ug via INTRAVENOUS

## 2022-04-22 MED ORDER — CEFAZOLIN SODIUM-DEXTROSE 2-4 GM/100ML-% IV SOLN
INTRAVENOUS | Status: AC
  Filled 2022-04-22: qty 100

## 2022-04-22 MED ORDER — VITAMIN B-12 1000 MCG PO TABS
1000.0000 ug | ORAL_TABLET | Freq: Every day | ORAL | Status: DC
  Administered 2022-04-22 – 2022-04-23 (×2): 1000 ug via ORAL
  Filled 2022-04-22 (×2): qty 1

## 2022-04-22 MED ORDER — ROCURONIUM BROMIDE 10 MG/ML (PF) SYRINGE
PREFILLED_SYRINGE | INTRAVENOUS | Status: DC | PRN
  Administered 2022-04-22: 20 mg via INTRAVENOUS
  Administered 2022-04-22: 50 mg via INTRAVENOUS
  Administered 2022-04-22: 10 mg via INTRAVENOUS
  Administered 2022-04-22: 20 mg via INTRAVENOUS

## 2022-04-22 MED ORDER — THROMBIN 5000 UNITS EX SOLR
CUTANEOUS | Status: AC
  Filled 2022-04-22: qty 5000

## 2022-04-22 MED ORDER — LIDOCAINE 2% (20 MG/ML) 5 ML SYRINGE
INTRAMUSCULAR | Status: AC
  Filled 2022-04-22: qty 5

## 2022-04-22 MED ORDER — FENTANYL CITRATE (PF) 250 MCG/5ML IJ SOLN
INTRAMUSCULAR | Status: DC | PRN
Start: 2022-04-22 — End: 2022-04-22
  Administered 2022-04-22 (×5): 50 ug via INTRAVENOUS

## 2022-04-22 MED ORDER — POLYETHYLENE GLYCOL 3350 17 G PO PACK: 17.0000 g | PACK | Freq: Every day | ORAL | Status: DC | PRN

## 2022-04-22 MED ORDER — 0.9 % SODIUM CHLORIDE (POUR BTL) OPTIME
TOPICAL | Status: DC | PRN
Start: 2022-04-22 — End: 2022-04-22
  Administered 2022-04-22: 1000 mL

## 2022-04-22 MED ORDER — CHLORHEXIDINE GLUCONATE 0.12 % MT SOLN
OROMUCOSAL | Status: AC
  Administered 2022-04-22: 15 mL
  Filled 2022-04-22: qty 15

## 2022-04-22 MED ORDER — CLONIDINE HCL 0.1 MG PO TABS
0.1000 mg | ORAL_TABLET | Freq: Every day | ORAL | Status: DC
  Administered 2022-04-22 – 2022-04-23 (×2): 0.1 mg via ORAL
  Filled 2022-04-22 (×3): qty 1

## 2022-04-22 MED ORDER — SUCCINYLCHOLINE CHLORIDE 200 MG/10ML IV SOSY
PREFILLED_SYRINGE | INTRAVENOUS | Status: DC | PRN
  Administered 2022-04-22: 160 mg via INTRAVENOUS

## 2022-04-22 MED ORDER — ACETAMINOPHEN 325 MG PO TABS
650.0000 mg | ORAL_TABLET | ORAL | Status: DC | PRN
  Administered 2022-04-22: 650 mg via ORAL
  Filled 2022-04-22: qty 2

## 2022-04-22 MED ORDER — ISOSORBIDE MONONITRATE ER 60 MG PO TB24
60.0000 mg | ORAL_TABLET | Freq: Every day | ORAL | Status: DC
  Filled 2022-04-22: qty 1

## 2022-04-22 MED ORDER — PRAVASTATIN SODIUM 10 MG PO TABS
20.0000 mg | ORAL_TABLET | Freq: Every day | ORAL | Status: DC
  Administered 2022-04-22 – 2022-04-23 (×2): 20 mg via ORAL
  Filled 2022-04-22 (×2): qty 2

## 2022-04-22 MED ORDER — ROCURONIUM BROMIDE 10 MG/ML (PF) SYRINGE
PREFILLED_SYRINGE | INTRAVENOUS | Status: AC
  Filled 2022-04-22: qty 10

## 2022-04-22 MED ORDER — PANTOPRAZOLE SODIUM 40 MG PO TBEC
40.0000 mg | DELAYED_RELEASE_TABLET | Freq: Every day | ORAL | Status: DC
  Administered 2022-04-22 – 2022-04-23 (×2): 40 mg via ORAL
  Filled 2022-04-22 (×2): qty 1

## 2022-04-22 SURGICAL SUPPLY — 52 items
BAG COUNTER SPONGE SURGICOUNT (BAG) ×3 IMPLANT
BAND RUBBER #18 3X1/16 STRL (MISCELLANEOUS) ×4 IMPLANT
BASKET BONE COLLECTION (BASKET) ×2 IMPLANT
BLADE SURG 11 STRL SS (BLADE) ×2 IMPLANT
BUR MATCHSTICK NEURO 3.0 LAGG (BURR) ×1 IMPLANT
BUR ROUND PRECISION 4.0 (BURR) ×3 IMPLANT
CAGE EXP CATALYFT 9 (Plate) ×1 IMPLANT
CAGE INTERBODY PL LG 7X26.5X24 (Cage) ×1 IMPLANT
CNTNR URN SCR LID CUP LEK RST (MISCELLANEOUS) ×1 IMPLANT
CONT SPEC 4OZ STRL OR WHT (MISCELLANEOUS) ×1
COVER BACK TABLE 60X90IN (DRAPES) ×2 IMPLANT
DERMABOND ADVANCED (GAUZE/BANDAGES/DRESSINGS) ×1
DERMABOND ADVANCED .7 DNX12 (GAUZE/BANDAGES/DRESSINGS) ×1 IMPLANT
DRAPE C-ARM 42X72 X-RAY (DRAPES) ×2 IMPLANT
DRAPE C-ARMOR (DRAPES) ×2 IMPLANT
DRAPE LAPAROTOMY 100X72X124 (DRAPES) ×2 IMPLANT
DRAPE MICROSCOPE LEICA (MISCELLANEOUS) ×2 IMPLANT
ELECT BLADE 6.5 EXT (BLADE) ×2 IMPLANT
ELECT REM PT RETURN 9FT ADLT (ELECTROSURGICAL) ×2
ELECTRODE REM PT RTRN 9FT ADLT (ELECTROSURGICAL) ×1 IMPLANT
EXTENDER TAB GUIDE SV 5.5/6.0 (INSTRUMENTS) ×12 IMPLANT
GLOVE BIOGEL PI IND STRL 7.0 (GLOVE) IMPLANT
GLOVE BIOGEL PI IND STRL 7.5 (GLOVE) ×1 IMPLANT
GLOVE BIOGEL PI INDICATOR 7.0 (GLOVE) ×2
GLOVE BIOGEL PI INDICATOR 7.5 (GLOVE) ×1
GLOVE ECLIPSE 7.5 STRL STRAW (GLOVE) ×2 IMPLANT
GOWN STRL REUS W/ TWL LRG LVL3 (GOWN DISPOSABLE) ×1 IMPLANT
GOWN STRL REUS W/ TWL XL LVL3 (GOWN DISPOSABLE) IMPLANT
GOWN STRL REUS W/TWL LRG LVL3 (GOWN DISPOSABLE) ×2
GOWN STRL REUS W/TWL XL LVL3 (GOWN DISPOSABLE) ×3
GUIDEWIRE BLUNT NT 450 (WIRE) ×6 IMPLANT
HEMOSTAT POWDER KIT SURGIFOAM (HEMOSTASIS) ×3 IMPLANT
KIT BASIN OR (CUSTOM PROCEDURE TRAY) ×2 IMPLANT
KIT POSITION SURG JACKSON T1 (MISCELLANEOUS) ×2 IMPLANT
KIT TURNOVER KIT B (KITS) ×1 IMPLANT
NDL BEVEL TWO-PAK W/1PK (NEEDLE) IMPLANT
NEEDLE BEVEL TWO-PAK W/1PK (NEEDLE) ×4 IMPLANT
NEEDLE HYPO 22GX1.5 SAFETY (NEEDLE) ×2 IMPLANT
NS IRRIG 1000ML POUR BTL (IV SOLUTION) ×2 IMPLANT
PACK LAMINECTOMY NEURO (CUSTOM PROCEDURE TRAY) ×2 IMPLANT
ROD PERC CCM 5.5X65 (Rod) ×2 IMPLANT
SCREW MAS VOYAGER 6.5X35 (Screw) ×4 IMPLANT
SCREW MAS VOYAGER 6.5X40 (Screw) ×2 IMPLANT
SCREW SET 5.5/6.0MM SOLERA (Screw) ×6 IMPLANT
SUT MNCRL AB 3-0 PS2 18 (SUTURE) ×2 IMPLANT
SUT VIC AB 0 CT1 18XCR BRD8 (SUTURE) IMPLANT
SUT VIC AB 0 CT1 8-18 (SUTURE)
SUT VIC AB 2-0 CP2 18 (SUTURE) ×2 IMPLANT
TOWEL GREEN STERILE (TOWEL DISPOSABLE) ×3 IMPLANT
TOWEL GREEN STERILE FF (TOWEL DISPOSABLE) ×2 IMPLANT
TRAY FOLEY MTR SLVR 16FR STAT (SET/KITS/TRAYS/PACK) ×1 IMPLANT
WATER STERILE IRR 1000ML POUR (IV SOLUTION) ×2 IMPLANT

## 2022-04-22 NOTE — Anesthesia Postprocedure Evaluation (Signed)
Anesthesia Post Note ? ?Patient: Angel Khan ? ?Procedure(s) Performed: Lumbar Four-Five, Lumbar Five-Sacral One Minimally Invasive Laminectomies with Transforaminal Lumbar Interbody Fusion (Spine Lumbar) ? ?  ? ?Patient location during evaluation: PACU ?Anesthesia Type: General ?Level of consciousness: awake ?Pain management: pain level controlled ?Vital Signs Assessment: post-procedure vital signs reviewed and stable ?Respiratory status: spontaneous breathing and respiratory function stable ?Cardiovascular status: stable ?Postop Assessment: no apparent nausea or vomiting ?Anesthetic complications: no ? ? ?No notable events documented. ? ?Last Vitals:  ?Vitals:  ? 04/22/22 1402 04/22/22 1428  ?BP: (!) 138/96 114/65  ?Pulse: 88 81  ?Resp: 14 18  ?Temp: 37.1 ?C 36.6 ?C  ?SpO2: 93% 95%  ?  ?Last Pain:  ?Vitals:  ? 04/22/22 1428  ?TempSrc: Oral  ?PainSc:   ? ? ?  ?  ?  ?  ?  ?  ? ?Merlinda Frederick ? ? ? ? ?

## 2022-04-22 NOTE — Anesthesia Procedure Notes (Signed)
Procedure Name: Intubation ?Date/Time: 04/22/2022 7:49 AM ?Performed by: Lowella Dell, CRNA ?Pre-anesthesia Checklist: Patient identified, Emergency Drugs available, Suction available and Patient being monitored ?Patient Re-evaluated:Patient Re-evaluated prior to induction ?Oxygen Delivery Method: Circle System Utilized ?Preoxygenation: Pre-oxygenation with 100% oxygen ?Induction Type: IV induction ?Ventilation: Mask ventilation without difficulty ?Laryngoscope Size: Mac and 4 ?Grade View: Grade I ?Tube type: Oral ?Tube size: 7.5 mm ?Number of attempts: 1 ?Airway Equipment and Method: Stylet ?Placement Confirmation: ETT inserted through vocal cords under direct vision, positive ETCO2 and breath sounds checked- equal and bilateral ?Secured at: 22 cm ?Tube secured with: Tape ?Dental Injury: Teeth and Oropharynx as per pre-operative assessment  ? ? ? ? ?

## 2022-04-22 NOTE — Progress Notes (Signed)
Patient in short stay; this writer noticed to have a small skin tear on left forearm. The skin was cleaned and a foam dressing was applied.  ?

## 2022-04-22 NOTE — Anesthesia Procedure Notes (Signed)
Arterial Line Insertion ?Start/End4/24/2023 6:55 AM, 04/22/2022 7:00 AM ?Performed by: Lowella Dell, CRNA ? Patient location: Pre-op. ?Preanesthetic checklist: patient identified, IV checked, site marked, risks and benefits discussed, surgical consent, monitors and equipment checked, pre-op evaluation, timeout performed and anesthesia consent ?Lidocaine 1% used for infiltration ?Right, radial was placed ?Catheter size: 20 G ? ?Attempts: 1 ?Procedure performed without using ultrasound guided technique. ?Following insertion, dressing applied and Biopatch. ?Post procedure assessment: normal and unchanged ? ?Patient tolerated the procedure well with no immediate complications. ? ? ?

## 2022-04-22 NOTE — H&P (Signed)
Surgical H&P Update ? ?HPI: 81 y.o. with a history of back and leg pain with left leg weakness. 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 No changes in health since they were last seen. Still having the above and wishes to proceed with surgery. ? ?PMHx:  ?Past Medical History:  ?Diagnosis Date  ? Adenomatous colon polyp   ? Barrett esophagus   ? CAD (coronary artery disease)   ? Chronic headaches   ? Complication of anesthesia   ? Hard to wake up  ? COPD (chronic obstructive pulmonary disease) ([place])   ? Coronary atherosclerosis   ? Diverticulitis of colon 11/2011  ? Diagnosed by CT scan [facility]  ? Dyspnea   ? GERD (gastroesophageal reflux disease)   ? Hemorrhoids   ? History of kidney stones   ? HLD (hyperlipidemia)   ? Horseshoe kidney   ? HTN (hypertension)   ? Melanoma ([place])   ? Poor short term memory   ? Pre-diabetes   ? Sleep apnea   ? Can not use a cpap  ? Tracheobronchomalacia   ? ?FamHx:  ?Family History  ?Problem Relation Age of Onset  ? Colitis Other   ?     niece  ? Diabetes Mother   ? Heart disease Mother   ? Heart disease Father   ? Heart disease Brother   ? Rheumatic fever Sister   ? Colon cancer Neg Hx   ? Stomach cancer Neg Hx   ? Pancreatic cancer Neg Hx   ? ?SocHx:  reports that he quit smoking about 45 years ago. His smoking use included cigarettes. He has a 40.00 pack-year smoking history. He has never used smokeless tobacco. He reports that he does not drink alcohol and does not use drugs. ? ?Physical Exam: ?Strength 5/5 x4 and SILTx4 except left EHL/TA weakness 4/5, otherwise 5/5 in BLE ? ?Assesment/Plan: ?81 y.o. 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 Risks, benefits, and alternatives discussed and the patient would like to continue with surgery. ? ?-OR today ?-3C post-op ? ?Judith Part, MD ?04/22/22 ?7:39 AM ? ?

## 2022-04-22 NOTE — Progress Notes (Signed)
Orthopedic Tech Progress Note ?Patient Details:  ?Angel Khan ?26-Nov-1941 ?[id number] ? ?Ortho Devices ?Type of Ortho Device: Lumbar corsett ?Ortho Device/Splint Location: BACK ?Ortho Device/Splint Interventions: Ordered ?  ?Post Interventions ?Patient Tolerated: Well ?Instructions Provided: Care of device ? ?Janit Pagan ?04/22/2022, 7:26 PM ? ?

## 2022-04-22 NOTE — Op Note (Signed)
PATIENT: Angel Khan ? ?DAY OF SURGERY: 04/22/22 ?  ?PRE-OPERATIVE DIAGNOSIS:  Lumbar radiculopathy, lumbar stenosis, lumbar spondylolisthesis ?  ?POST-OPERATIVE DIAGNOSIS:  Same ?  ?PROCEDURE:  L4-L5, L5-S1 minimally invasive laminectomies and transforaminal lumbar interbody fusion  ?  ?SURGEON:  Surgeon(s) and Role: ?   Judith Part, MD - Primary ?   Newman Pies, MD - Assisting ?  ?ANESTHESIA: ETGA ?  ?BRIEF HISTORY: This is a 81 year old man who presented with back and leg pain with left foot weakness. 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 His symptoms were not responsive to non-surgical treatment measures, I therefore recommended surgical intervention. We discussed this at length as well as risks, benefits, and alternatives and the patient wished to proceed with surgery. ?  ?OPERATIVE DETAIL:  The patient was taken to the operating room and placed on the OR table in the prone position. A formal time out was performed with two patient identifiers and confirmed the operative site. Anesthesia was induced by the anesthesia team. The operative site was marked, hair was clipped with surgical clippers, the area was then prepped and draped in a sterile fashion.  ? ?Fluoroscopy was used to localize the surgical level. The pedicles were marked and used to create skin incisions bilaterally. With fluoro guidance, Jamshidi needles were used to guide K-wires into the bilateral L4, L5 and S1 pedicles. The K wires were then secured with hemostats and attention turned to the TLIF. ? ?A [employer] tube was then docked to the left L4-5 facet through the same incision using fluoroscopy. A left L4-5 facetectomy was performed and the left L4 nerve root was decompressed along its entire course. The tube was wanded medially and the decompression was continued medially until reaching the contralateral foramen. The laminectomy was completed, which was obviously more than what was needed for  instrumentation alone. The tube was wanded back to the disc space. The disc space was identified, incised, and a discectomy was performed in the standard fashion. The endplates were prepped, bone graft was packed into the disc space, and an expandable cage ([employer]) was packed with autograft and placed into the disc space with fluoroscopic confirmation.  ? ?The tube was removed and hemostasis was obtained during its removal. It was then docked in the same fashion to the left L5-S1 facet and the same technique was used as described above to perform a decompressive laminectomy followed by a left sided L5-S1 TLIF. During the laminectomy, the dura was quite adherent to the ligamentum flavum caudally and on the right side contralaterally. During dissection with a ball-tipped probe, I did get a CSF leak off the lateral ventral surface of the right side of the thecal sac. It was a pinhole without any root herniation. It was packed off with gelfoam and did not leak again so no further repair was attempted. I was able to free up the ligamentum flavum more caudally near the foramen and follow it up around the adherent area, at which time I completed decompression of the right lateral recess. The tube was then removed again with hemostasis obtained and both incisions were copiously irrigated. ? ?Using the previously placed K wires, a tap and then screw with tower were placed bilaterally at L4, L5 and S1. A rod was sized and introduced on both sides, confirmed with fluoroscopy, then final tightened. Hemostasis was again confirmed for both incisions, they were copiously irrigated, and then closed in layers.  ?  ?EBL:  231m ?  ?  DRAINS: none ?  ?SPECIMENS: none ?  ?Judith Part, MD ?04/22/22 ?7:47 AM ? ?

## 2022-04-22 NOTE — Transfer of Care (Signed)
Immediate Anesthesia Transfer of Care Note ? ?Patient: Angel Khan ? ?Procedure(s) Performed: Lumbar Four-Five, Lumbar Five-Sacral One Minimally Invasive Laminectomies with Transforaminal Lumbar Interbody Fusion (Spine Lumbar) ? ?Patient Location: PACU ? ?Anesthesia Type:General ? ?Level of Consciousness: drowsy and patient cooperative ? ?Airway & Oxygen Therapy: Patient Spontanous Breathing and Patient connected to face mask oxygen ? ?Post-op Assessment: Report given to RN, Post -op Vital signs reviewed and stable and Patient moving all extremities X 4 ? ?Post vital signs: Reviewed and stable ? ?Last Vitals:  ?Vitals Value Taken Time  ?BP 156/83 04/22/22 1302  ?Temp    ?Pulse 83 04/22/22 1304  ?Resp 12 04/22/22 1304  ?SpO2 99 % 04/22/22 1304  ?Vitals shown include unvalidated device data. ? ?Last Pain:  ?Vitals:  ? 04/22/22 0554  ?TempSrc:   ?PainSc: 0-No pain  ?   ? ?  ? ?Complications: No notable events documented. ?

## 2022-04-23 DIAGNOSIS — M48061 Spinal stenosis, lumbar region without neurogenic claudication: Secondary | ICD-10-CM | POA: Diagnosis not present

## 2022-04-23 MED ORDER — CLOPIDOGREL BISULFATE 75 MG PO TABS: 75.0000 mg | ORAL_TABLET | Freq: Every day | ORAL | Status: AC

## 2022-04-23 MED ORDER — OXYCODONE-ACETAMINOPHEN 5-325 MG PO TABS: 1.0000 | ORAL_TABLET | ORAL | 0 refills | Status: DC | PRN

## 2022-04-23 MED ORDER — ASPIRIN 325 MG PO TBEC: 325.0000 mg | DELAYED_RELEASE_TABLET | Freq: Every day | ORAL | Status: DC

## 2022-04-23 MED FILL — Thrombin For Soln 5000 Unit: CUTANEOUS | Qty: 5000 | Status: AC

## 2022-04-23 NOTE — Evaluation (Signed)
Occupational Therapy Evaluation ?Patient Details ?Name: Angel Khan ?MRN: [id number] ?DOB: Feb 17, 1941 ?Today's Date: 04/23/2022 ? ? ?History of Present Illness Pt is a 81 y/o M with history of back and leg pain and L weakness, workup showing stenosis and degenerative sponylolisthesis at L4-5 and L5-S1. S/p L4-5, L5-S1, laminectomies, and transforaminal lumbar interbody fusion on 4/24. PMH includes CAD, COPD, HTN, HLD and sleep apnea.  ? ?Clinical Impression ?  ?Pt independent at baseline with ADLs and functional mobility, lives with spouse, spouse and daughter can assist at d/c. Pt currently min guard -min A for ADLs, mod I for bed mobility, and min guard for standing/in room ambulation. Pt educated on back precautions and compensatory strategies for bed mobility, LB dressing, grooming, showering, along with brace application and wear schedule. Pt verbalizes understanding, able to adhere to precautions throughout session. Pt presenting with impairments listed below, will follow. Anticipate no OT follow up at d/c. ?   ? ?Recommendations for follow up therapy are one component of a multi-disciplinary discharge planning process, led by the attending physician.  Recommendations may be updated based on patient status, additional functional criteria and insurance authorization.  ? ?Follow Up Recommendations ? No OT follow up  ?  ?Assistance Recommended at Discharge PRN  ?Patient can return home with the following A little help with bathing/dressing/bathroom;Assistance with cooking/housework;Help with stairs or ramp for entrance;Assist for transportation ? ?  ?Functional Status Assessment ? Patient has had a recent decline in their functional status and demonstrates the ability to make significant improvements in function in a reasonable and predictable amount of time.  ?Equipment Recommendations ? BSC/3in1 (as shower seat, pt declining)  ?  ?Recommendations for Other Services PT consult ? ? ?  ?Precautions /  Restrictions Precautions ?Precautions: Back ?Precaution Booklet Issued: Yes (comment) ?Precaution Comments: able to recall 3/3 precautions ?Required Braces or Orthoses: Spinal Brace ?Spinal Brace: Lumbar corset ?Restrictions ?Weight Bearing Restrictions: No  ? ?  ? ?Mobility Bed Mobility ?Overal bed mobility: Modified Independent ?  ?  ?  ?  ?  ?  ?General bed mobility comments: log rolling technique ?  ? ?Transfers ?Overall transfer level: Needs assistance ?  ?Transfers: Sit to/from Stand ?Sit to Stand: Min guard, From elevated surface ?  ?  ?  ?  ?  ?General transfer comment: no AD use ?  ? ?  ?Balance Overall balance assessment: Mild deficits observed, not formally tested ?  ?  ?  ?  ?  ?  ?  ?  ?  ?  ?  ?  ?  ?  ?  ?  ?  ?  ?   ? ?ADL either performed or assessed with clinical judgement  ? ?ADL Overall ADL's : Needs assistance/impaired ?Eating/Feeding: Sitting;Modified independent ?  ?Grooming: Set up;Standing ?  ?Upper Body Bathing: Minimal assistance;Sitting;Standing ?  ?Lower Body Bathing: Minimal assistance;Sitting/lateral leans;Sit to/from stand ?  ?Upper Body Dressing : Min guard;Sitting;Standing ?  ?Lower Body Dressing: Minimal assistance;Sitting/lateral leans;Sit to/from stand ?  ?Toilet Transfer: Min guard;Ambulation;Regular Toilet ?  ?Toileting- Clothing Manipulation and Hygiene: Sitting/lateral lean;Sit to/from stand;Supervision/safety ?  ?Tub/ Shower Transfer: Min guard;Ambulation;Tub transfer ?  ?Functional mobility during ADLs: Min guard ?   ? ? ? ?Vision Baseline Vision/History: 1 Wears glasses ?Vision Assessment?: No apparent visual deficits  ?   ?Perception   ?  ?Praxis   ?  ? ?Pertinent Vitals/Pain Pain Assessment ?Pain Assessment: Faces ?Pain Score: 7  ?Faces Pain Scale: Hurts even more ?  Pain Location: back ?Pain Descriptors / Indicators: Constant, Discomfort ?Pain Intervention(s): Limited activity within patient's tolerance, Monitored during session, Patient requesting pain meds-RN notified   ? ? ? ?Hand Dominance   ?  ?Extremity/Trunk Assessment Upper Extremity Assessment ?Upper Extremity Assessment: Overall WFL for tasks assessed ?  ?Lower Extremity Assessment ?Lower Extremity Assessment: Defer to PT evaluation ?  ?Cervical / Trunk Assessment ?Cervical / Trunk Assessment: Back Surgery ?  ?Communication Communication ?Communication: No difficulties ?  ?Cognition Arousal/Alertness: Awake/alert ?Behavior During Therapy: [facility] for tasks assessed/performed ?Overall Cognitive Status: Within Functional Limits for tasks assessed ?  ?  ?  ?  ?  ?  ?  ?  ?  ?  ?  ?  ?  ?  ?  ?  ?  ?  ?  ?General Comments  VSS on RA ? ?  ?Exercises   ?  ?Shoulder Instructions    ? ? ?Home Living Family/patient expects to be discharged to:: Private residence ?Living Arrangements: Spouse/significant other ?Available Help at Discharge: Family;Available 24 hours/day;Available PRN/intermittently ?Type of Home: Mobile home ?Home Access: Stairs to enter ?Entrance Stairs-Number of Steps: 3-4 ?  ?Home Layout: One level ?  ?  ?Bathroom Shower/Tub: Tub/shower unit;Curtain ?  ?Bathroom Toilet: Standard ?  ?  ?Home Equipment: Conservation officer, nature (2 wheels) ?  ?  ?  ? ?  ?Prior Functioning/Environment Prior Level of Function : Independent/Modified Independent ?  ?  ?  ?  ?  ?  ?Mobility Comments: no AD use ?ADLs Comments: does IADLs ?  ? ?  ?  ?OT Problem List: Decreased strength;Decreased range of motion;Decreased activity tolerance;Impaired balance (sitting and/or standing);Decreased knowledge of use of DME or AE;Decreased knowledge of precautions;Decreased safety awareness ?  ?   ?OT Treatment/Interventions: Self-care/ADL training;Therapeutic exercise;DME and/or AE instruction;Therapeutic activities;Patient/family education;Balance training  ?  ?OT Goals(Current goals can be found in the care plan section) Acute Rehab OT Goals ?Patient Stated Goal: none stated ?OT Goal Formulation: With patient ?Time For Goal Achievement: 05/07/22 ?Potential to  Achieve Goals: Good ?ADL Goals ?Pt Will Perform Upper Body Dressing: Independently;sitting;standing ?Pt Will Perform Lower Body Dressing: with supervision;sit to/from stand;sitting/lateral leans ?Pt Will Transfer to Toilet: Independently;ambulating;regular height toilet ?Pt Will Perform Tub/Shower Transfer: Tub transfer;ambulating  ?OT Frequency: Min 2X/week ?  ? ?Co-evaluation   ?  ?  ?  ?  ? ?  ?AM-PAC OT "6 Clicks" Daily Activity     ?Outcome Measure Help from another person eating meals?: None ?Help from another person taking care of personal grooming?: None ?Help from another person toileting, which includes using toliet, bedpan, or urinal?: A Little ?Help from another person bathing (including washing, rinsing, drying)?: A Little ?Help from another person to put on and taking off regular upper body clothing?: A Little ?Help from another person to put on and taking off regular lower body clothing?: A Little ?6 Click Score: 20 ?  ?End of Session Nurse Communication: Mobility status ? ?Activity Tolerance: Patient tolerated treatment well ?Patient left: in bed;with call bell/phone within reach (sittiing EOB) ? ?OT Visit Diagnosis: Unsteadiness on feet (R26.81);Other abnormalities of gait and mobility (R26.89);Muscle weakness (generalized) (M62.81)  ?              ?Time: 1660-6301 ?OT Time Calculation (min): 30 min ?Charges:  OT General Charges ?$OT Visit: 1 Visit ?OT Evaluation ?$OT Eval Low Complexity: 1 Low ?OT Treatments ?$Self Care/Home Management : 8-22 mins ? ?Lynnda Child, OTD, OTR/L ?Acute Rehab ?[phone] ? ?Kaylyn Lim ?04/23/2022, 10:31 AM ?

## 2022-04-23 NOTE — Progress Notes (Signed)
Neurosurgery Service ?Progress Note ? ?Subjective: No acute events overnight, no radicular symptoms, no new complaints this morning, back pain as expected / incisional pain  ? ?Objective: ?Vitals:  ? 04/22/22 1629 04/22/22 2047 04/22/22 2254 04/23/22 0436  ?BP: (!) 143/96 115/64 (!) 108/49 110/83  ?Pulse: 91 90 87 80  ?Resp: '18 20 16 20  '$ ?Temp: 97.8 ?F (36.6 ?C) 99.1 ?F (37.3 ?C) 99.8 ?F (37.7 ?C) 98.3 ?F (36.8 ?C)  ?TempSrc:  Oral Oral Oral  ?SpO2: 97% 96% 94% 97%  ?Weight:      ?Height:      ? ? ?Physical Exam: ?Strength 5/5 x4 and SILTx4 except left EHL/TA weakness 4 to 4+/5, o/w 5/5 ? ?Assessment & Plan: ?81 y.o. man w/ 2 level degen spondy and severe stenosis s/p L4-5 / L5-S1 MIS laminectomies and TLIFs, recovering well. ? ?-discharge home today ? ?[place]  ?04/23/22 ?7:56 AM ? ?

## 2022-04-23 NOTE — Progress Notes (Signed)
Pt doing well. Pt and family given D/C instructions with verbal understanding. Rx's were sent to the pharmacy by MD. Pt's incision is clean and dry with no sign of infection. Pt's IV was removed prior to D/C. Pt D/C'd home via wheelchair per MD order. Pt is stable @ D/C and has no other needs at this time. Everett Ehrler, RN  

## 2022-04-23 NOTE — Discharge Summary (Signed)
Discharge Summary ? ?Date of Admission: 04/22/2022 ? ?Date of Discharge: 04/23/22 ? ?Attending Physician: Emelda Brothers, MD ? ?Hospital Course: Patient was admitted following an uncomplicated V2-3/C1-G4 MIS laminectomies and TLIFs. They were recovered in PACU and transferred to [facility]. Their hospital course was uncomplicated and the patient was discharged home on 04/23/22. They will follow up in clinic with me in clinic in 2 weeks. ? ?Neurologic exam at discharge:  ?Strength 5/5 x4 and SILTx4 except left EHL/TA weakness 4 to 4+/5, o/w 5/5 ? ?Discharge diagnosis: Lumbar stenosis, lumbar spondylolisthesis ? ?Judith Part, MD ?04/23/22 ?8:00 AM ? ?

## 2022-04-23 NOTE — Evaluation (Signed)
Physical Therapy Evaluation ? ?Patient Details ?Name: Angel Khan ?MRN: [id number] ?DOB: 1941-04-15 ?Today's Date: 04/23/2022 ? ?History of Present Illness ? Pt is a 81 y/o M with history of back and leg pain and L weakness, workup showing stenosis and degenerative sponylolisthesis at L4-5 and L5-S1. S/p L4-5, L5-S1, laminectomies, and transforaminal lumbar interbody fusion on 4/24. PMH includes CAD, COPD, HTN, HLD and sleep apnea.  ?Clinical Impression ? Pt admitted with above diagnosis. At the time of PT eval, pt was able to demonstrate transfers and ambulation with gross min guard assist and no AD. Pt prefers not using the RW however occasionally appears unsteady. A SPC may be helpful as pt progresses with mobility at home to longer distances. Pt was educated on precautions, brace application/wearing schedule, appropriate activity progression, and car transfer. Pt currently with functional limitations due to the deficits listed below (see PT Problem List). Pt will benefit from skilled PT to increase their independence and safety with mobility to allow discharge to the venue listed below.     ?   ? ?Recommendations for follow up therapy are one component of a multi-disciplinary discharge planning process, led by the attending physician.  Recommendations may be updated based on patient status, additional functional criteria and insurance authorization. ? ?Follow Up Recommendations No PT follow up ? ?  ?Assistance Recommended at Discharge PRN  ?Patient can return home with the following ? A little help with walking and/or transfers;A little help with bathing/dressing/bathroom;Assistance with cooking/housework;Assist for transportation;Help with stairs or ramp for entrance ? ?  ?Equipment Recommendations None recommended by PT  ?Recommendations for Other Services ?    ?  ?Functional Status Assessment Patient has had a recent decline in their functional status and demonstrates the ability to make significant  improvements in function in a reasonable and predictable amount of time.  ? ?  ?Precautions / Restrictions Precautions ?Precautions: Back ?Precaution Booklet Issued: Yes (comment) ?Precaution Comments: able to recall 3/3 precautions ?Required Braces or Orthoses: Spinal Brace ?Spinal Brace: Lumbar corset;Applied in sitting position ?Restrictions ?Weight Bearing Restrictions: No  ? ?  ? ?Mobility ? Bed Mobility ?  ?  ?  ?  ?  ?  ?  ?General bed mobility comments: Pt was received sitting up in the recliner. Verbally reviewed log roll technique. ?  ? ?Transfers ?Overall transfer level: Needs assistance ?Equipment used: None ?Transfers: Sit to/from Stand ?Sit to Stand: Min guard ?  ?  ?  ?  ?  ?General transfer comment: Pt wanting to try without the RW for support. Pt demonstrated proper hand placement on seated surface for safety. Somewhat uncontrolled descent to chair at end of session but overall safe. ?  ? ?Ambulation/Gait ?Ambulation/Gait assistance: Min guard ?Gait Distance (Feet): 300 Feet ?Assistive device: None ?Gait Pattern/deviations: Step-through pattern, Decreased stride length, Trunk flexed, Wide base of support ?Gait velocity: Decreaed ?Gait velocity interpretation: 1.31 - 2.62 ft/sec, indicative of limited community ambulator ?  ?General Gait Details: VC's for improved posture and general safety. Pt with mild trendelenberg gait and occasional unsteadiness however able to recover without assistance. Hands on guarding provided throughout for safety. ? ?Stairs ?Stairs: Yes ?Stairs assistance: Min guard, Min assist ?Stair Management: One rail Right, Alternating pattern, Forwards ?Number of Stairs: 6 ?General stair comments: Initially attempted without UE support and with mild LOB. Light min assist provided for safety to recover with min guard the rest of the time as pt utilizing railing. ? ?Wheelchair Mobility ?  ? ?Modified Rankin (  Stroke Patients Only) ?  ? ?  ? ?Balance Overall balance assessment: Needs  assistance ?Sitting-balance support: Feet supported, No upper extremity supported ?Sitting balance-Leahy Scale: Fair ?  ?  ?Standing balance support: No upper extremity supported, During functional activity ?Standing balance-Leahy Scale: Poor ?Standing balance comment: Occasionally for dynamic activity ?  ?  ?  ?  ?  ?  ?  ?  ?  ?  ?  ?   ? ? ? ?Pertinent Vitals/Pain Pain Assessment ?Pain Assessment: Faces ?Faces Pain Scale: Hurts even more ?Pain Location: back ?Pain Descriptors / Indicators: Constant, Discomfort ?Pain Intervention(s): Limited activity within patient's tolerance, Monitored during session, Repositioned  ? ? ?Home Living Family/patient expects to be discharged to:: Private residence ?Living Arrangements: Spouse/significant other ?Available Help at Discharge: Family;Available 24 hours/day;Available PRN/intermittently ?Type of Home: Mobile home ?Home Access: Stairs to enter ?  ?Entrance Stairs-Number of Steps: 3-4 ?  ?Home Layout: One level ?Home Equipment: Conservation officer, nature (2 wheels) ?   ?  ?Prior Function Prior Level of Function : Independent/Modified Independent ?  ?  ?  ?  ?  ?  ?Mobility Comments: no AD use ?ADLs Comments: does IADLs ?  ? ? ?Hand Dominance  ?   ? ?  ?Extremity/Trunk Assessment  ? Upper Extremity Assessment ?Upper Extremity Assessment: Defer to OT evaluation ?  ? ?Lower Extremity Assessment ?Lower Extremity Assessment: Generalized weakness (Consistent with pre-op diagnosis) ?  ? ?Cervical / Trunk Assessment ?Cervical / Trunk Assessment: Back Surgery  ?Communication  ? Communication: HOH  ?Cognition Arousal/Alertness: Awake/alert ?Behavior During Therapy: [facility] for tasks assessed/performed ?Overall Cognitive Status: Within Functional Limits for tasks assessed ?  ?  ?  ?  ?  ?  ?  ?  ?  ?  ?  ?  ?  ?  ?  ?  ?  ?  ?  ? ?  ?General Comments General comments (skin integrity, edema, etc.): VSS on RA ? ?  ?Exercises    ? ?Assessment/Plan  ?  ?PT Assessment Patient needs continued PT services   ?PT Problem List Decreased strength;Decreased activity tolerance;Decreased balance;Decreased mobility;Decreased knowledge of use of DME;Decreased safety awareness;Decreased knowledge of precautions;Pain ? ?   ?  ?PT Treatment Interventions DME instruction;Gait training;Stair training;Functional mobility training;Therapeutic activities;Therapeutic exercise;Balance training;Patient/family education   ? ?PT Goals (Current goals can be found in the Care Plan section)  ?Acute Rehab PT Goals ?Patient Stated Goal: Home today ?PT Goal Formulation: With patient ?Time For Goal Achievement: 04/30/22 ?Potential to Achieve Goals: Good ? ?  ?Frequency Min 5X/week ?  ? ? ?Co-evaluation   ?  ?  ?  ?  ? ? ?  ?AM-PAC PT "6 Clicks" Mobility  ?Outcome Measure Help needed turning from your back to your side while in a flat bed without using bedrails?: A Little ?Help needed moving from lying on your back to sitting on the side of a flat bed without using bedrails?: A Little ?Help needed moving to and from a bed to a chair (including a wheelchair)?: A Little ?Help needed standing up from a chair using your arms (e.g., wheelchair or bedside chair)?: A Little ?Help needed to walk in hospital room?: A Little ?Help needed climbing 3-5 steps with a railing? : A Little ?6 Click Score: 18 ? ?  ?End of Session Equipment Utilized During Treatment: Gait belt;Back brace ?Activity Tolerance: Patient tolerated treatment well ?Patient left: in chair;with call bell/phone within reach;with family/visitor present ?Nurse Communication: Mobility status ?PT  Visit Diagnosis: Unsteadiness on feet (R26.81);Pain ?Pain - part of body:  (back) ?  ? ?Time: 5638-7564 ?PT Time Calculation (min) (ACUTE ONLY): 12 min ? ? ?Charges:   PT Evaluation ?$PT Eval Low Complexity: 1 Low ?  ?  ?   ? ? ?Rolinda Roan, PT, DPT ?Acute Rehabilitation Services ?Secure Chat Preferred ?Office: [phone]  ? ?Thelma Comp ?04/23/2022, 12:55 PM ? ?

## 2022-04-25 ENCOUNTER — Encounter (HOSPITAL_COMMUNITY): Payer: Self-pay | Admitting: Neurological Surgery

## 2022-04-26 LAB — BPAM RBC
Blood Product Expiration Date: 202305252359
Blood Product Expiration Date: 202305252359
Unit Type and Rh: 5100
Unit Type and Rh: 5100

## 2022-04-26 LAB — TYPE AND SCREEN
ABO/RH(D): O POS
Antibody Screen: NEGATIVE
Unit division: 0
Unit division: 0

## 2022-09-11 ENCOUNTER — Telehealth: Payer: Self-pay | Admitting: *Deleted

## 2022-09-11 NOTE — Telephone Encounter (Signed)
Patient's wife returned call.  I explained the medication is pravastatin.  Wife voiced understanding of the information.

## 2022-09-11 NOTE — Telephone Encounter (Signed)
Patient called leaving message inquiring the name of the "statin medication" he was prescribed.  He states he is having "a lot of leg pain".  I returned call at 11:38 and received recording that mailbox is full.  Will continue trying to reach patient.

## 2023-02-18 ENCOUNTER — Ambulatory Visit: Payer: Medicare Other | Admitting: Orthopaedic Surgery

## 2023-02-26 ENCOUNTER — Ambulatory Visit (INDEPENDENT_AMBULATORY_CARE_PROVIDER_SITE_OTHER): Payer: Medicare Other | Admitting: Orthopaedic Surgery

## 2023-02-26 DIAGNOSIS — G5701 Lesion of sciatic nerve, right lower limb: Secondary | ICD-10-CM

## 2023-02-26 NOTE — Progress Notes (Signed)
Office Visit Note   Patient: Angel Khan           Date of Birth: [birth year]           MRN: [id number] Visit Date: 02/26/2023              Requested by: Medicine, [place] [facility] No address on file PCP: Pablo Lawrence, NP   Assessment & Plan: Visit Diagnoses:  1. Piriformis syndrome, right     Plan: Impression is right-sided piriformis syndrome.  At this point we have made the recommendation of outpatient physical therapy for which she is agreeable to.  Referral has been made to our location in [place].  He will follow-up with [place] as needed.  Follow-Up Instructions: Return if symptoms worsen or fail to improve.   Orders:  Orders Placed This Encounter  Procedures   Ambulatory referral to Physical Therapy   No orders of the defined types were placed in this encounter.     Procedures: No procedures performed   Clinical Data: No additional findings.   Subjective: Chief Complaint  Patient presents with   Right Hip - Pain    HPI patient is a pleasant 82 year old gentleman who comes in today with right buttock pain for the past 2 years.  Symptoms started after he underwent lumbar spine nerve ablation by Dr. Francesco Runner.  He denies any pain in the groin, anterior thigh or any radiation down the leg.  He notes that this is constant but worse when he is lying on his right side as well as when he gets into the wrong position in his car.  He denies any weakness or paresthesias to the right lower extremity.  He has been taking Norco and NSAIDs without significant relief.  He denies any bowel or bladder change.  He has not been to physical therapy for this.  He did recently undergo MRI of the right hip and back which showed degenerative tearing of the right labrum in addition to mild OA.  Right lumbar spine MRI showed laminectomy L4-5 and L5 1 without any other findings.  Review of Systems as detailed in HPI.  All others reviewed and are  negative.   Objective: Vital Signs: There were no vitals taken for this visit.  Physical Exam well-developed well-nourished gentleman in no acute distress.  Alert and oriented x 3.  Ortho Exam lumbar spine exam is unremarkable.  Negative straight leg raise.  He has a moderate tenderness to the piriformis.  No pain with straight leg raise, logroll or FADIR testing.  Specialty Comments:  No specialty comments available.  Imaging: No new imaging   PMFS History: Patient Active Problem List   Diagnosis Date Noted   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   Asymptomatic carotid artery stenosis without infarction, right 03/14/2021   Gastroesophageal reflux disease 04/20/2020   OSA (obstructive sleep apnea) 04/04/2020   Seasonal and perennial allergic rhinitis 04/04/2020   DOE (dyspnea on exertion) 08/04/2019   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   Cough variant asthma vs uacs 05/19/2019   Diarrhea 03/22/2015   Chronic cough 12/23/2013   Essential hypertension 12/23/2013   CORONARY ARTERY DISEASE, S/P PTCA 10/08/2010   BARRETTS ESOPHAGUS 10/08/2010   COLONIC POLYPS, ADENOMATOUS, HX OF 10/08/2010   Past Medical History:  Diagnosis Date   Adenomatous colon polyp    Barrett esophagus    CAD (coronary artery disease)    Chronic headaches    Complication of anesthesia    Hard to wake up  COPD (chronic obstructive pulmonary disease) (HCC)    Coronary atherosclerosis    Diverticulitis of colon 11/2011   Diagnosed by CT scan [facility]   Dyspnea    GERD (gastroesophageal reflux disease)    Hemorrhoids    History of kidney stones    HLD (hyperlipidemia)    Horseshoe kidney    HTN (hypertension)    Melanoma (HCC)    Poor short term memory    Pre-diabetes    Sleep apnea    Can not use a cpap   Tracheobronchomalacia     Family History  Problem Relation Age of Onset   Colitis Other        niece   Diabetes Mother    Heart disease Mother    Heart  disease Father    Heart disease Brother    Rheumatic fever Sister    Colon cancer Neg Hx    Stomach cancer Neg Hx    Pancreatic cancer Neg Hx     Past Surgical History:  Procedure Laterality Date   BIOPSY  05/07/2021   Procedure: BIOPSY;  Surgeon: Gatha Mayer, MD;  Location: [facility];  Service: Endoscopy;;   BRAIN SURGERY     Aneurysm 2004   CARDIAC CATHETERIZATION  01/01/2013   CHOLECYSTECTOMY  01/12/2013   Dr. Gerlene Fee   COLONOSCOPY W/ BIOPSIES AND POLYPECTOMY  09/08/2007   adenomatous polyps, diverticulosis, external hemorrhoids   COLONOSCOPY WITH PROPOFOL N/A 05/07/2021   Procedure: COLONOSCOPY WITH PROPOFOL;  Surgeon: Gatha Mayer, MD;  [facility];  Service: Endoscopy;  Laterality: N/A;   CORONARY ARTERY BYPASS GRAFT  07/11/2009   off-pump CABG: LIMA-LAD 07/11/09; occluded graft 09/07/09, s/p DES LAD just after DIAG   CORONARY STENT PLACEMENT     x 5   ENDARTERECTOMY Left 03/14/2021   Procedure: LEFT CAROTID ARTERY ENDARTERECTOMY;  Surgeon: Serafina Mitchell, MD;  [facility];  Service: Vascular;  Laterality: Left;   ESOPHAGOGASTRODUODENOSCOPY (EGD) WITH PROPOFOL N/A 05/07/2021   Procedure: ESOPHAGOGASTRODUODENOSCOPY (EGD) WITH PROPOFOL;  Surgeon: Gatha Mayer, MD;  [facility];  Service: Endoscopy;  Laterality: N/A;   HERNIA REPAIR     abdominal   LEFT CAROTID ARTERY ENDARTERECTOMY   03/14/2021   POLYPECTOMY  05/07/2021   Procedure: POLYPECTOMY;  Surgeon: Gatha Mayer, MD;  [facility];  Service: Endoscopy;;   TONSILLECTOMY     TRANSFORAMINAL LUMBAR INTERBODY FUSION W/ MIS 2 LEVEL N/A 04/22/2022   Procedure: Lumbar Four-Five, Lumbar Five-Sacral One Minimally Invasive Laminectomies with Transforaminal Lumbar Interbody Fusion;  Surgeon: Judith Part, MD;  Location: [place];  Service: Neurosurgery;  Laterality: N/A;   UMBILICAL HERNIA REPAIR     UPPER GASTROINTESTINAL ENDOSCOPY  10/12/2010   barrett's, fondic gland polyps,  duodenitis   Social History   Occupational History   Occupation: retired  Tobacco Use   Smoking status: Former    Packs/day: 1.00    Years: 40.00    Total pack years: 40.00    Types: Cigarettes    Quit date: 07/30/1976    Years since quitting: 46.6   Smokeless tobacco: Never  Vaping Use   Vaping Use: Never used  Substance and Sexual Activity   Alcohol use: No   Drug use: No   Sexual activity: Not on file

## 2023-02-28 ENCOUNTER — Other Ambulatory Visit: Payer: Self-pay

## 2023-02-28 ENCOUNTER — Encounter: Payer: Self-pay | Admitting: Physical Therapy

## 2023-02-28 ENCOUNTER — Ambulatory Visit: Payer: Medicare Other | Attending: Physician Assistant | Admitting: Physical Therapy

## 2023-02-28 DIAGNOSIS — G5701 Lesion of sciatic nerve, right lower limb: Secondary | ICD-10-CM | POA: Insufficient documentation

## 2023-02-28 DIAGNOSIS — M6281 Muscle weakness (generalized): Secondary | ICD-10-CM

## 2023-02-28 NOTE — Therapy (Signed)
OUTPATIENT PHYSICAL THERAPY THORACOLUMBAR EVALUATION   Patient Name: Angel Khan MRN: [id number] DOB:September 13, 1941, 82 y.o., male Today's Date: 02/28/2023  END OF SESSION:  PT End of Session - 02/28/23 0923     Visit Number 1    Number of Visits 12    Date for PT Re-Evaluation 04/11/23    Authorization Type Medicare    Authorization - Visit Number 1    Progress Note Due on Visit 10    PT Start Time 0845    PT Stop Time 0920    PT Time Calculation (min) 35 min    Activity Tolerance Patient tolerated treatment well    Behavior During Therapy [facility] for tasks assessed/performed             Past Medical History:  Diagnosis Date   Adenomatous colon polyp    Barrett esophagus    CAD (coronary artery disease)    Chronic headaches    Complication of anesthesia    Hard to wake up   COPD (chronic obstructive pulmonary disease) ([place])    Coronary atherosclerosis    Diverticulitis of colon 11/2011   Diagnosed by [facility] scan [facility]   Dyspnea    GERD (gastroesophageal reflux disease)    Hemorrhoids    History of kidney stones    HLD (hyperlipidemia)    Horseshoe kidney    HTN (hypertension)    Melanoma ([place])    Poor short term memory    Pre-diabetes    Sleep apnea    Can not use a cpap   Tracheobronchomalacia    Past Surgical History:  Procedure Laterality Date   BIOPSY  05/07/2021   Procedure: BIOPSY;  Surgeon: Gatha Mayer, MD;  [facility];  Service: Endoscopy;;   BRAIN SURGERY     Aneurysm 2004   CARDIAC CATHETERIZATION  01/01/2013   CHOLECYSTECTOMY  01/12/2013   Dr. Gerlene Fee   COLONOSCOPY W/ BIOPSIES AND POLYPECTOMY  09/08/2007   adenomatous polyps, diverticulosis, external hemorrhoids   COLONOSCOPY WITH PROPOFOL N/A 05/07/2021   Procedure: COLONOSCOPY WITH PROPOFOL;  Surgeon: Gatha Mayer, MD;  [facility];  Service: Endoscopy;  Laterality: N/A;   CORONARY ARTERY BYPASS GRAFT  07/11/2009   off-pump CABG: LIMA-LAD  07/11/09; occluded graft 09/07/09, s/p DES LAD just after DIAG   CORONARY STENT PLACEMENT     x 5   ENDARTERECTOMY Left 03/14/2021   Procedure: LEFT CAROTID ARTERY ENDARTERECTOMY;  Surgeon: Serafina Mitchell, MD;  [facility];  Service: Vascular;  Laterality: Left;   ESOPHAGOGASTRODUODENOSCOPY (EGD) WITH PROPOFOL N/A 05/07/2021   Procedure: ESOPHAGOGASTRODUODENOSCOPY (EGD) WITH PROPOFOL;  Surgeon: Gatha Mayer, MD;  [facility];  Service: Endoscopy;  Laterality: N/A;   HERNIA REPAIR     abdominal   LEFT CAROTID ARTERY ENDARTERECTOMY   03/14/2021   POLYPECTOMY  05/07/2021   Procedure: POLYPECTOMY;  Surgeon: Gatha Mayer, MD;  [facility];  Service: Endoscopy;;   TONSILLECTOMY     TRANSFORAMINAL LUMBAR INTERBODY FUSION W/ MIS 2 LEVEL N/A 04/22/2022   Procedure: Lumbar Four-Five, Lumbar Five-Sacral One Minimally Invasive Laminectomies with Transforaminal Lumbar Interbody Fusion;  Surgeon: Judith Part, MD;  Location: [place];  Service: Neurosurgery;  Laterality: N/A;   UMBILICAL HERNIA REPAIR     UPPER GASTROINTESTINAL ENDOSCOPY  10/12/2010   barrett's, fondic gland polyps, duodenitis   Patient Active Problem List   Diagnosis Date Noted   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   Asymptomatic carotid  artery stenosis without infarction, right 03/14/2021   Gastroesophageal reflux disease 04/20/2020   OSA (obstructive sleep apnea) 04/04/2020   Seasonal and perennial allergic rhinitis 04/04/2020   DOE (dyspnea on exertion) 08/04/2019   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   Cough variant asthma vs uacs 05/19/2019   Diarrhea 03/22/2015   Chronic cough 12/23/2013   Essential hypertension 12/23/2013   CORONARY ARTERY DISEASE, S/P PTCA 10/08/2010   BARRETTS ESOPHAGUS 10/08/2010   COLONIC POLYPS, ADENOMATOUS, HX OF 10/08/2010    PCP: Pablo Lawrence  REFERRING PROVIDER: Dwana Melena  REFERRING DIAG: piriformis syndrome  Rationale for Evaluation and  Treatment: Rehabilitation  THERAPY DIAG:  Piriformis syndrome of right side  Muscle weakness (generalized)  ONSET DATE: 2022  SUBJECTIVE:                                                                                                                                                                                           SUBJECTIVE STATEMENT: Pt states he has been having Rt buttock pain for "about 2 years". He had a nerve ablation around that time and since then he has had pain. Pain increases with prolonged walking (more than 10 minutes) and standing, sitting or laying with pressure on the buttocks. Pain decreases with meds, muscle rub.  PERTINENT HISTORY:  Lumbar surgery 04/22/22  PAIN:  Are you having pain? Yes: NPRS scale: 3/10 currently, 10/10 at worst/10 Pain location: Rt buttocks Pain description: sharp Aggravating factors: stand, walk Relieving factors: meds  PRECAUTIONS: None  WEIGHT BEARING RESTRICTIONS: No  FALLS:  Has patient fallen in last 6 months? Yes. 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  PLOF: Independent  PATIENT GOALS: decrease pain  NEXT MD VISIT: none scheduled  OBJECTIVE:    MUSCLE LENGTH: Hamstrings: Right 70 deg; Left 70 deg   PALPATION: TTP Rt piriformis No TTP Rt hip joint or hamstrings  LUMBAR ROM:   AROM eval  Flexion Limited 40% by hamstring length  Extension WFL  Right lateral flexion   Left lateral flexion   Right rotation   Left rotation    (Blank rows = not tested)    LOWER EXTREMITY MMT:    MMT Right eval Left eval  Hip flexion 4 4  Hip extension 3+ 4-  Hip abduction 4 4  Hip adduction    Hip internal rotation    Hip external rotation    Knee flexion    Knee extension    Ankle dorsiflexion    Ankle plantarflexion    Ankle inversion    Ankle eversion     (Blank rows = not tested)  LUMBAR SPECIAL TESTS:  FABER (-) bilat SLR (-) bilat   TODAY'S TREATMENT:                                                                                                                               [facility] Adult PT Treatment:                                                DATE: 02/28/23 Therapeutic Exercise: See HEP Modalities: TENS x 10 min Rt piriformis to tolerance Moist heat x 10 min Rt hips      PATIENT EDUCATION:  Education details: PT POC and goals, TENS, HEP Person educated: Patient Education method: Explanation, Demonstration, and Handouts Education comprehension: verbalized understanding and returned demonstration  HOME EXERCISE PROGRAM: Access Code: [email or web address] URL: [email or web address] Date: 02/28/2023 Prepared by: Isabelle Course  Exercises - Supine Bridge  - 1 x daily - 7 x weekly - 3 sets - 10 reps - Hip Abduction with Resistance Loop  - 1 x daily - 7 x weekly - 3 sets - 10 reps - Standing Hip Extension Kicks  - 1 x daily - 7 x weekly - 3 sets - 10 reps - Standing Hip Flexion with Resistance Loop  - 1 x daily - 7 x weekly - 3 sets - 10 reps  ASSESSMENT:  CLINICAL IMPRESSION: Patient is a 82 y.o. male who was seen today for physical therapy evaluation and treatment for Rt piriformis syndrome. Pt presents with decreased strength, decreased mobility, increased pain, decreased activity tolerance and increased muscle spasticity. Pt will benefit from skilled PT to address deficits and improve functional mobility.   OBJECTIVE IMPAIRMENTS: decreased activity tolerance, decreased mobility, difficulty walking, decreased strength, increased muscle spasms, and pain.   ACTIVITY LIMITATIONS: standing and locomotion level  PARTICIPATION LIMITATIONS: community activity  PERSONAL FACTORS: Age and Time since onset of injury/illness/exacerbation are also affecting patient's functional outcome.   REHAB POTENTIAL: Good  CLINICAL DECISION MAKING: Evolving/moderate complexity  EVALUATION COMPLEXITY: Moderate   GOALS: Goals reviewed with  patient? Yes  SHORT TERM GOALS: Target date: 03/14/2023    Pt will be independent with initial HEP Baseline: Goal status: INITIAL   LONG TERM GOALS: Target date: 04/11/2023    Pt will be independent in advanced HEP Baseline:  Goal status: INITIAL  2.  Pt will improve LE strength to 4+/5 to improve standing and walking tolerance Baseline:  Goal status: INITIAL  3.  Pt will tolerate walking x 10 minutes with pain <= 2/10 Baseline:  Goal status: INITIAL   PLAN:  PT FREQUENCY: 2x/week  PT DURATION: 6 weeks  PLANNED INTERVENTIONS: Therapeutic exercises, Therapeutic activity, Neuromuscular re-education, Balance training, Gait training, Patient/Family education, Self Care, Joint mobilization, Aquatic Therapy, Dry Needling, Electrical stimulation, Cryotherapy, Moist heat, Taping, Ultrasound, Ionotophoresis '4mg'$ /ml Dexamethasone, Manual therapy, and  Re-evaluation.  PLAN FOR NEXT SESSION: assess response to HEP, hip and glute strength   Merrie Epler, PT 02/28/2023, 9:25 AM

## 2023-03-05 ENCOUNTER — Ambulatory Visit: Payer: Medicare Other | Admitting: Physical Therapy

## 2023-03-05 ENCOUNTER — Encounter: Payer: Self-pay | Admitting: Physical Therapy

## 2023-03-05 DIAGNOSIS — G5701 Lesion of sciatic nerve, right lower limb: Secondary | ICD-10-CM

## 2023-03-05 DIAGNOSIS — M6281 Muscle weakness (generalized): Secondary | ICD-10-CM

## 2023-03-05 NOTE — Therapy (Signed)
OUTPATIENT PHYSICAL THERAPY TREATMENT   Patient Name: Angel Khan MRN: [id number] DOB:01-26-1941, 82 y.o., male Today's Date: 03/05/2023  END OF SESSION:  PT End of Session - 03/05/23 1142     Visit Number 2    Number of Visits 12    Date for PT Re-Evaluation 04/11/23    Authorization Type Medicare    Authorization - Visit Number 2    Progress Note Due on Visit 10    PT Start Time 1055    PT Stop Time 1143    PT Time Calculation (min) 48 min    Activity Tolerance Patient tolerated treatment well    Behavior During Therapy WFL for tasks assessed/performed              Past Medical History:  Diagnosis Date   Adenomatous colon polyp    Barrett esophagus    CAD (coronary artery disease)    Chronic headaches    Complication of anesthesia    Hard to wake up   COPD (chronic obstructive pulmonary disease) ([place])    Coronary atherosclerosis    Diverticulitis of colon 11/2011   Diagnosed by CT scan [facility]   Dyspnea    GERD (gastroesophageal reflux disease)    Hemorrhoids    History of kidney stones    HLD (hyperlipidemia)    Horseshoe kidney    HTN (hypertension)    Melanoma ([place])    Poor short term memory    Pre-diabetes    Sleep apnea    Can not use a cpap   Tracheobronchomalacia    Past Surgical History:  Procedure Laterality Date   BIOPSY  05/07/2021   Procedure: BIOPSY;  Surgeon: Gatha Mayer, MD;  [facility];  Service: Endoscopy;;   BRAIN SURGERY     Aneurysm 2004   CARDIAC CATHETERIZATION  01/01/2013   CHOLECYSTECTOMY  01/12/2013   Dr. Gerlene Fee   COLONOSCOPY W/ BIOPSIES AND POLYPECTOMY  09/08/2007   adenomatous polyps, diverticulosis, external hemorrhoids   COLONOSCOPY WITH PROPOFOL N/A 05/07/2021   Procedure: COLONOSCOPY WITH PROPOFOL;  Surgeon: Gatha Mayer, MD;  [facility];  Service: Endoscopy;  Laterality: N/A;   CORONARY ARTERY BYPASS GRAFT  07/11/2009   off-pump CABG: LIMA-LAD 07/11/09;  occluded graft 09/07/09, s/p DES LAD just after DIAG   CORONARY STENT PLACEMENT     x 5   ENDARTERECTOMY Left 03/14/2021   Procedure: LEFT CAROTID ARTERY ENDARTERECTOMY;  Surgeon: Serafina Mitchell, MD;  [facility];  Service: Vascular;  Laterality: Left;   ESOPHAGOGASTRODUODENOSCOPY (EGD) WITH PROPOFOL N/A 05/07/2021   Procedure: ESOPHAGOGASTRODUODENOSCOPY (EGD) WITH PROPOFOL;  Surgeon: Gatha Mayer, MD;  [facility];  Service: Endoscopy;  Laterality: N/A;   HERNIA REPAIR     abdominal   LEFT CAROTID ARTERY ENDARTERECTOMY   03/14/2021   POLYPECTOMY  05/07/2021   Procedure: POLYPECTOMY;  Surgeon: Gatha Mayer, MD;  [facility];  Service: Endoscopy;;   TONSILLECTOMY     TRANSFORAMINAL LUMBAR INTERBODY FUSION W/ MIS 2 LEVEL N/A 04/22/2022   Procedure: Lumbar Four-Five, Lumbar Five-Sacral One Minimally Invasive Laminectomies with Transforaminal Lumbar Interbody Fusion;  Surgeon: Judith Part, MD;  Location: [place] [facility];  Service: Neurosurgery;  Laterality: N/A;   UMBILICAL HERNIA REPAIR     UPPER GASTROINTESTINAL ENDOSCOPY  10/12/2010   barrett's, fondic gland polyps, duodenitis   Patient Active Problem List   Diagnosis Date Noted   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   Asymptomatic carotid  artery stenosis without infarction, right 03/14/2021   Gastroesophageal reflux disease 04/20/2020   OSA (obstructive sleep apnea) 04/04/2020   Seasonal and perennial allergic rhinitis 04/04/2020   DOE (dyspnea on exertion) 08/04/2019   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   Cough variant asthma vs uacs 05/19/2019   Diarrhea 03/22/2015   Chronic cough 12/23/2013   Essential hypertension 12/23/2013   CORONARY ARTERY DISEASE, S/P PTCA 10/08/2010   BARRETTS ESOPHAGUS 10/08/2010   COLONIC POLYPS, ADENOMATOUS, HX OF 10/08/2010    PCP: Pablo Lawrence  REFERRING PROVIDER: Dwana Melena  REFERRING DIAG: piriformis syndrome  Rationale for Evaluation and Treatment:  Rehabilitation  THERAPY DIAG:  Piriformis syndrome of right side  Muscle weakness (generalized)  ONSET DATE: 2022  SUBJECTIVE:                                                                                                                                                                                           SUBJECTIVE STATEMENT: Pt states he feels his hip is hurting worse today, he took some meds prior to session  PERTINENT HISTORY:  Lumbar surgery 04/22/22  Pt states he has been having Rt buttock pain for "about 2 years". He had a nerve ablation around that time and since then he has had pain. Pain increases with prolonged walking (more than 10 minutes) and standing, sitting or laying with pressure on the buttocks. Pain decreases with meds, muscle rub.  PAIN:  Are you having pain? Yes: NPRS scale: 5/10 currently, 10/10 at worst/10 Pain location: Rt buttocks Pain description: sharp Aggravating factors: stand, walk Relieving factors: meds  PRECAUTIONS: None  WEIGHT BEARING RESTRICTIONS: No  FALLS:  Has patient fallen in last 6 months? Yes. 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  PLOF: Independent  PATIENT GOALS: decrease pain  NEXT MD VISIT: none scheduled  OBJECTIVE:    MUSCLE LENGTH: Hamstrings: Right 70 deg; Left 70 deg   LUMBAR ROM:   AROM eval  Flexion Limited 40% by hamstring length  Extension WFL  Right lateral flexion   Left lateral flexion   Right rotation   Left rotation    (Blank rows = not tested)    LOWER EXTREMITY MMT:    MMT Right eval Left eval  Hip flexion 4 4  Hip extension 3+ 4-  Hip abduction 4 4  Hip adduction    Hip internal rotation    Hip external rotation    Knee flexion    Knee extension    Ankle dorsiflexion    Ankle plantarflexion    Ankle inversion    Ankle eversion     (  Blank rows = not tested)  LUMBAR SPECIAL TESTS:  FABER (-) bilat SLR (-) bilat   TODAY'S  TREATMENT:  [facility] Adult PT Treatment:                                                DATE: 03/05/23 Therapeutic Exercise: Nustep L6 x 5 min for warm up Hip abd red TB 2 x 10 bilat Hip ext red TB 2 x 10 bilat Marching with resistance 2 x 10 red TB Step up x 10 bilat 6'' step SLR AAROM x 10 bilat LTR small range x 2 minutes Supine clam red TB x 20 Manual Therapy: STM Rt lumbar paraspinals and glutes  Modalities: Moist heat lumbar/glutes x 10 min Tens Rt lumbar and glutes x 10 min to tolerance                                                                                                                              [facility] Adult PT Treatment:                                                DATE: 02/28/23 Therapeutic Exercise: See HEP Modalities: TENS x 10 min Rt piriformis to tolerance Moist heat x 10 min Rt hips      PATIENT EDUCATION:  Education details: PT POC and goals, TENS, HEP Person educated: Patient Education method: Explanation, Demonstration, and Handouts Education comprehension: verbalized understanding and returned demonstration  HOME EXERCISE PROGRAM: Access Code: [email or web address] URL: [email or web address] Date: 02/28/2023 Prepared by: Isabelle Course  Exercises - Supine Bridge  - 1 x daily - 7 x weekly - 3 sets - 10 reps - Hip Abduction with Resistance Loop  - 1 x daily - 7 x weekly - 3 sets - 10 reps - Standing Hip Extension Kicks  - 1 x daily - 7 x weekly - 3 sets - 10 reps - Standing Hip Flexion with Resistance Loop  - 1 x daily - 7 x weekly - 3 sets - 10 reps  ASSESSMENT:  CLINICAL IMPRESSION: Pt with improved tolerance to exercise this session. Unable to tolerate bridge due to pain but improved tolerance to LTR and SLR. Good response to modalities    GOALS: Goals reviewed with patient? Yes  SHORT TERM GOALS: Target date: 03/14/2023    Pt will be independent with initial HEP Baseline: Goal status: INITIAL   LONG TERM GOALS: Target date:  04/11/2023    Pt will be independent in advanced HEP Baseline:  Goal status: INITIAL  2.  Pt will improve LE strength to 4+/5 to improve standing and walking tolerance Baseline:  Goal status: INITIAL  3.  Pt will tolerate walking  x 10 minutes with pain <= 2/10 Baseline:  Goal status: INITIAL   PLAN:  PT FREQUENCY: 2x/week  PT DURATION: 6 weeks  PLANNED INTERVENTIONS: Therapeutic exercises, Therapeutic activity, Neuromuscular re-education, Balance training, Gait training, Patient/Family education, Self Care, Joint mobilization, Aquatic Therapy, Dry Needling, Electrical stimulation, Cryotherapy, Moist heat, Taping, Ultrasound, Ionotophoresis '4mg'$ /ml Dexamethasone, Manual therapy, and Re-evaluation.  PLAN FOR NEXT SESSION: assess response to HEP, hip and glute strength   Akili Corsetti, PT 03/05/2023, 11:43 AM

## 2023-03-07 ENCOUNTER — Encounter: Payer: Self-pay | Admitting: Physical Therapy

## 2023-03-07 ENCOUNTER — Ambulatory Visit: Payer: Medicare Other | Admitting: Physical Therapy

## 2023-03-07 DIAGNOSIS — M6281 Muscle weakness (generalized): Secondary | ICD-10-CM

## 2023-03-07 DIAGNOSIS — G5701 Lesion of sciatic nerve, right lower limb: Secondary | ICD-10-CM | POA: Diagnosis not present

## 2023-03-07 NOTE — Therapy (Signed)
OUTPATIENT PHYSICAL THERAPY TREATMENT   Patient Name: Angel Khan MRN: [id number] DOB:[birth year], 82 y.o., male Today's Date: 03/07/2023  END OF SESSION:  PT End of Session - 03/07/23 1004     Visit Number 3    Number of Visits 12    Date for PT Re-Evaluation 04/11/23    Authorization Type Medicare    Authorization - Visit Number 3    Progress Note Due on Visit 10    PT Start Time 0930    PT Stop Time [id number]    PT Time Calculation (min) 44 min    Activity Tolerance Patient tolerated treatment well    Behavior During Therapy WFL for tasks assessed/performed               Past Medical History:  Diagnosis Date   Adenomatous colon polyp    Barrett esophagus    CAD (coronary artery disease)    Chronic headaches    Complication of anesthesia    Hard to wake up   COPD (chronic obstructive pulmonary disease) ([place])    Coronary atherosclerosis    Diverticulitis of colon 11/2011   Diagnosed by CT scan [facility]   Dyspnea    GERD (gastroesophageal reflux disease)    Hemorrhoids    History of kidney stones    HLD (hyperlipidemia)    Horseshoe kidney    HTN (hypertension)    Melanoma ([place])    Poor short term memory    Pre-diabetes    Sleep apnea    Can not use a cpap   Tracheobronchomalacia    Past Surgical History:  Procedure Laterality Date   BIOPSY  05/07/2021   Procedure: BIOPSY;  Surgeon: Gatha Mayer, MD;  Location: [facility];  Service: Endoscopy;;   BRAIN SURGERY     Aneurysm 2004   CARDIAC CATHETERIZATION  01/01/2013   CHOLECYSTECTOMY  01/12/2013   Dr. Gerlene Fee   COLONOSCOPY W/ BIOPSIES AND POLYPECTOMY  09/08/2007   adenomatous polyps, diverticulosis, external hemorrhoids   COLONOSCOPY WITH PROPOFOL N/A 05/07/2021   Procedure: COLONOSCOPY WITH PROPOFOL;  Surgeon: Gatha Mayer, MD;  Location: [facility];  Service: Endoscopy;  Laterality: N/A;   CORONARY ARTERY BYPASS GRAFT  07/11/2009   off-pump CABG: LIMA-LAD 07/11/09;  occluded graft 09/07/09, s/p DES LAD just after DIAG   CORONARY STENT PLACEMENT     x 5   ENDARTERECTOMY Left 03/14/2021   Procedure: LEFT CAROTID ARTERY ENDARTERECTOMY;  Surgeon: Serafina Mitchell, MD;  Location: [facility];  Service: Vascular;  Laterality: Left;   ESOPHAGOGASTRODUODENOSCOPY (EGD) WITH PROPOFOL N/A 05/07/2021   Procedure: ESOPHAGOGASTRODUODENOSCOPY (EGD) WITH PROPOFOL;  Surgeon: Gatha Mayer, MD;  [facility]: [facility];  Service: Endoscopy;  Laterality: N/A;   HERNIA REPAIR     abdominal   LEFT CAROTID ARTERY ENDARTERECTOMY   03/14/2021   POLYPECTOMY  05/07/2021   Procedure: POLYPECTOMY;  Surgeon: Gatha Mayer, MD;  [facility]: [facility];  Service: Endoscopy;;   TONSILLECTOMY     TRANSFORAMINAL LUMBAR INTERBODY FUSION W/ MIS 2 LEVEL N/A 04/22/2022   Procedure: Lumbar Four-Five, Lumbar Five-Sacral One Minimally Invasive Laminectomies with Transforaminal Lumbar Interbody Fusion;  Surgeon: Judith Part, MD;  Location: [place];  Service: Neurosurgery;  Laterality: N/A;   UMBILICAL HERNIA REPAIR     UPPER GASTROINTESTINAL ENDOSCOPY  10/12/2010   barrett's, fondic gland polyps, duodenitis   Patient Active Problem List   Diagnosis Date Noted   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   Asymptomatic  carotid artery stenosis without infarction, right 03/14/2021   Gastroesophageal reflux disease 04/20/2020   OSA (obstructive sleep apnea) 04/04/2020   Seasonal and perennial allergic rhinitis 04/04/2020   DOE (dyspnea on exertion) 08/04/2019   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   Cough variant asthma vs uacs 05/19/2019   Diarrhea 03/22/2015   Chronic cough 12/23/2013   Essential hypertension 12/23/2013   CORONARY ARTERY DISEASE, S/P PTCA 10/08/2010   BARRETTS ESOPHAGUS 10/08/2010   COLONIC POLYPS, ADENOMATOUS, HX OF 10/08/2010    PCP: Pablo Lawrence  REFERRING PROVIDER: Dwana Melena  REFERRING DIAG: piriformis syndrome  Rationale for Evaluation and Treatment:  Rehabilitation  THERAPY DIAG:  Piriformis syndrome of right side  Muscle weakness (generalized)  ONSET DATE: 2022  SUBJECTIVE:                                                                                                                                                                                           SUBJECTIVE STATEMENT: Pt states he can "tell it's there" but that pain is better than last week  PERTINENT HISTORY:  Lumbar surgery 04/22/22  Pt states he has been having Rt buttock pain for "about 2 years". He had a nerve ablation around that time and since then he has had pain. Pain increases with prolonged walking (more than 10 minutes) and standing, sitting or laying with pressure on the buttocks. Pain decreases with meds, muscle rub.  PAIN:  Are you having pain? Yes: NPRS scale: 3/10 currently, 10/10 at worst/10 Pain location: Rt buttocks Pain description: sharp Aggravating factors: stand, walk Relieving factors: meds  PRECAUTIONS: None  WEIGHT BEARING RESTRICTIONS: No  FALLS:  Has patient fallen in last 6 months? Yes. 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  PLOF: Independent  PATIENT GOALS: decrease pain  NEXT MD VISIT: none scheduled  OBJECTIVE:    MUSCLE LENGTH: Hamstrings: Right 70 deg; Left 70 deg   LUMBAR ROM:   AROM eval  Flexion Limited 40% by hamstring length  Extension WFL  Right lateral flexion   Left lateral flexion   Right rotation   Left rotation    (Blank rows = not tested)    LOWER EXTREMITY MMT:    MMT Right eval Left eval  Hip flexion 4 4  Hip extension 3+ 4-  Hip abduction 4 4  Hip adduction    Hip internal rotation    Hip external rotation    Knee flexion    Knee extension    Ankle dorsiflexion    Ankle plantarflexion    Ankle inversion    Ankle eversion     (Blank  rows = not tested)  LUMBAR SPECIAL TESTS:  FABER (-) bilat SLR (-) bilat   TODAY'S TREATMENT:   [facility] Adult PT Treatment:                                                DATE: 03/07/23 Therapeutic Exercise: Nustep L6 x 5 minutes for warm up Hip abd red TB 2 x 10 bilat Hip ext red TB 2 x 10 bilat Marching with resistance 2 x 10 red TB Step ups 6'' step 2 x 10 bilat Supine LTR x 3 min in tolerable range SLR x 10 bilat AROM Supine clam x 20 red TB Manual Therapy: STM lumbar paraspinals and glutes Modalities: Moist heat x 10 min lumbar TENS lumbar x 10 min to tolerance   [facility] Adult PT Treatment:                                                DATE: 03/05/23 Therapeutic Exercise: Nustep L6 x 5 min for warm up Hip abd red TB 2 x 10 bilat Hip ext red TB 2 x 10 bilat Marching with resistance 2 x 10 red TB Step up x 10 bilat 6'' step SLR AAROM x 10 bilat LTR small range x 2 minutes Supine clam red TB x 20 Manual Therapy: STM Rt lumbar paraspinals and glutes  Modalities: Moist heat lumbar/glutes x 10 min Tens Rt lumbar and glutes x 10 min to tolerance                                                                                                                                PATIENT EDUCATION:  Education details: PT POC and goals, TENS, HEP Person educated: Patient Education method: Explanation, Demonstration, and Handouts Education comprehension: verbalized understanding and returned demonstration  HOME EXERCISE PROGRAM: Access Code: [email or web address] URL: [email or web address] Date: 02/28/2023 Prepared by: Isabelle Course  Exercises - Supine Bridge  - 1 x daily - 7 x weekly - 3 sets - 10 reps - Hip Abduction with Resistance Loop  - 1 x daily - 7 x weekly - 3 sets - 10 reps - Standing Hip Extension Kicks  - 1 x daily - 7 x weekly - 3 sets - 10 reps - Standing Hip Flexion with Resistance Loop  - 1 x daily - 7 x weekly - 3 sets - 10 reps  ASSESSMENT:  CLINICAL IMPRESSION: Decreased pain today. Pt better able to tolerate LTR and SLR. Continues with good response to  manual work. Progressing towards pain goal    GOALS: Goals reviewed with patient? Yes  SHORT TERM GOALS: Target date: 03/14/2023    Pt will be independent with initial  HEP Baseline: Goal status: INITIAL   LONG TERM GOALS: Target date: 04/11/2023    Pt will be independent in advanced HEP Baseline:  Goal status: INITIAL  2.  Pt will improve LE strength to 4+/5 to improve standing and walking tolerance Baseline:  Goal status: INITIAL  3.  Pt will tolerate walking x 10 minutes with pain <= 2/10 Baseline:  Goal status: INITIAL   PLAN:  PT FREQUENCY: 2x/week  PT DURATION: 6 weeks  PLANNED INTERVENTIONS: Therapeutic exercises, Therapeutic activity, Neuromuscular re-education, Balance training, Gait training, Patient/Family education, Self Care, Joint mobilization, Aquatic Therapy, Dry Needling, Electrical stimulation, Cryotherapy, Moist heat, Taping, Ultrasound, Ionotophoresis '4mg'$ /ml Dexamethasone, Manual therapy, and Re-evaluation.  PLAN FOR NEXT SESSION: hip and glute strength, endurance. Add core strength   Mashanda Ishibashi, PT 03/07/2023, 10:05 AM

## 2023-03-11 ENCOUNTER — Ambulatory Visit: Payer: Medicare Other | Admitting: Physical Therapy

## 2023-03-13 ENCOUNTER — Ambulatory Visit: Payer: Medicare Other

## 2023-03-13 DIAGNOSIS — M6281 Muscle weakness (generalized): Secondary | ICD-10-CM

## 2023-03-13 DIAGNOSIS — G5701 Lesion of sciatic nerve, right lower limb: Secondary | ICD-10-CM

## 2023-03-13 NOTE — Therapy (Addendum)
OUTPATIENT PHYSICAL THERAPY TREATMENT AND DISCHARGE   Patient Name: Angel Khan MRN: [id number] DOB:1941/05/18, 82 y.o., male Today's Date: 03/13/2023  END OF SESSION:  PT End of Session - 03/13/23 1153     Visit Number 4    Number of Visits 12    Date for PT Re-Evaluation 04/11/23    Authorization Type Medicare    Progress Note Due on Visit 10    PT Start Time 1145    PT Stop Time 1235    PT Time Calculation (min) 50 min    Activity Tolerance Patient tolerated treatment well    Behavior During Therapy WFL for tasks assessed/performed                Past Medical History:  Diagnosis Date   Adenomatous colon polyp    Barrett esophagus    CAD (coronary artery disease)    Chronic headaches    Complication of anesthesia    Hard to wake up   COPD (chronic obstructive pulmonary disease) ([facility])    Coronary atherosclerosis    Diverticulitis of colon 11/2011   Diagnosed by [facility] scan [facility]   Dyspnea    GERD (gastroesophageal reflux disease)    Hemorrhoids    History of kidney stones    HLD (hyperlipidemia)    Horseshoe kidney    HTN (hypertension)    Melanoma ([facility])    Poor short term memory    Pre-diabetes    Sleep apnea    Can not use a cpap   Tracheobronchomalacia    Past Surgical History:  Procedure Laterality Date   BIOPSY  05/07/2021   Procedure: BIOPSY;  Surgeon: Iva Boop, MD;  [facility];  Service: Endoscopy;;   BRAIN SURGERY     Aneurysm 2004   CARDIAC CATHETERIZATION  01/01/2013   CHOLECYSTECTOMY  01/12/2013   Dr. Ferne Reus   COLONOSCOPY W/ BIOPSIES AND POLYPECTOMY  09/08/2007   adenomatous polyps, diverticulosis, external hemorrhoids   COLONOSCOPY WITH PROPOFOL N/A 05/07/2021   Procedure: COLONOSCOPY WITH PROPOFOL;  Surgeon: Iva Boop, MD;  [facility];  Service: Endoscopy;  Laterality: N/A;   CORONARY ARTERY BYPASS GRAFT  07/11/2009   off-pump CABG: LIMA-LAD 07/11/09; occluded graft  09/07/09, s/p DES LAD just after DIAG   CORONARY STENT PLACEMENT     x 5   ENDARTERECTOMY Left 03/14/2021   Procedure: LEFT CAROTID ARTERY ENDARTERECTOMY;  Surgeon: Nada Libman, MD;  [facility];  Service: Vascular;  Laterality: Left;   ESOPHAGOGASTRODUODENOSCOPY (EGD) WITH PROPOFOL N/A 05/07/2021   Procedure: ESOPHAGOGASTRODUODENOSCOPY (EGD) WITH PROPOFOL;  Surgeon: Iva Boop, MD;  [facility];  Service: Endoscopy;  Laterality: N/A;   HERNIA REPAIR     abdominal   LEFT CAROTID ARTERY ENDARTERECTOMY   03/14/2021   POLYPECTOMY  05/07/2021   Procedure: POLYPECTOMY;  Surgeon: Iva Boop, MD;  [facility];  Service: Endoscopy;;   TONSILLECTOMY     TRANSFORAMINAL LUMBAR INTERBODY FUSION W/ MIS 2 LEVEL N/A 04/22/2022   Procedure: Lumbar Four-Five, Lumbar Five-Sacral One Minimally Invasive Laminectomies with Transforaminal Lumbar Interbody Fusion;  Surgeon: Jadene Pierini, MD;  Location[facility];  Service: Neurosurgery;  Laterality: N/A;   UMBILICAL HERNIA REPAIR     UPPER GASTROINTESTINAL ENDOSCOPY  10/12/2010   barrett's, fondic gland polyps, duodenitis   Patient Active Problem List   Diagnosis Date Noted   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   Asymptomatic carotid artery stenosis without infarction,  right 03/14/2021   Gastroesophageal reflux disease 04/20/2020   OSA (obstructive sleep apnea) 04/04/2020   Seasonal and perennial allergic rhinitis 04/04/2020   DOE (dyspnea on exertion) 08/04/2019   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   Cough variant asthma vs uacs 05/19/2019   Diarrhea 03/22/2015   Chronic cough 12/23/2013   Essential hypertension 12/23/2013   CORONARY ARTERY DISEASE, S/P PTCA 10/08/2010   BARRETTS ESOPHAGUS 10/08/2010   COLONIC POLYPS, ADENOMATOUS, HX OF 10/08/2010    PCP: Roe Rutherford  REFERRING PROVIDER: Jari Sportsman  REFERRING DIAG: piriformis syndrome  Rationale for Evaluation and Treatment:  Rehabilitation  THERAPY DIAG:  Piriformis syndrome of right side  Muscle weakness (generalized)  ONSET DATE: 2022  SUBJECTIVE:                                                                                                                                                                                           SUBJECTIVE STATEMENT: Patient reports his R buttock pain is feeling better but his inner thighs/groins are sore.   PERTINENT HISTORY:  Lumbar surgery 04/22/22  Pt states he has been having Rt buttock pain for "about 2 years". He had a nerve ablation around that time and since then he has had pain. Pain increases with prolonged walking (more than 10 minutes) and standing, sitting or laying with pressure on the buttocks. Pain decreases with meds, muscle rub.  PAIN:  Are you having pain? Yes: NPRS scale: 3/10 currently, 10/10 at worst/10 Pain location: Rt buttocks Pain description: sharp Aggravating factors: stand, walk Relieving factors: meds  PRECAUTIONS: None  WEIGHT BEARING RESTRICTIONS: No  FALLS:  Has patient fallen in last 6 months? Yes. Number of falls unknown    OCCUPATION: retired, Location manager for recycling  PLOF: Independent  PATIENT GOALS: decrease pain  NEXT MD VISIT: none scheduled  OBJECTIVE:    MUSCLE LENGTH: Hamstrings: Right 70 deg; Left 70 deg   LUMBAR ROM:   AROM eval  Flexion Limited 40% by hamstring length  Extension WFL  Right lateral flexion   Left lateral flexion   Right rotation   Left rotation    (Blank rows = not tested)    LOWER EXTREMITY MMT:    MMT Right eval Left eval  Hip flexion 4 4  Hip extension 3+ 4-  Hip abduction 4 4  Hip adduction    Hip internal rotation    Hip external rotation    Knee flexion    Knee extension    Ankle dorsiflexion    Ankle plantarflexion    Ankle inversion    Ankle eversion     (Blank rows = not tested)  LUMBAR SPECIAL TESTS:  FABER (-) bilat SLR (-)  bilat   TODAY'S TREATMENT:   [facility] Adult PT Treatment:                                                DATE: 03/13/2023 Therapeutic Exercise: Mat Table: Add stretch (add) Hooklying hip isometrics: add (ball squeeze) & abd (gait belt) Figure 4 LTR (R crossed) x10 Seated HS stretch (R)  Therapeutic Activity: Discussion with patient on MRI results,PT POC, and patient's personal goals Manual Therapy: Prone STM piriformis & glutes    [facility] Adult PT Treatment:                                                DATE: 03/07/23 Therapeutic Exercise: Nustep L6 x 5 minutes for warm up Hip abd red TB 2 x 10 bilat Hip ext red TB 2 x 10 bilat Marching with resistance 2 x 10 red TB Step ups 6'' step 2 x 10 bilat Supine LTR x 3 min in tolerable range SLR x 10 bilat AROM Supine clam x 20 red TB Manual Therapy: STM lumbar paraspinals and glutes Modalities: Moist heat x 10 min lumbar TENS lumbar x 10 min to tolerance   [facility] Adult [facility]:                                                DATE: 03/05/23 Therapeutic Exercise: Nustep L6 x 5 min for warm up Hip abd red TB 2 x 10 bilat Hip ext red TB 2 x 10 bilat Marching with resistance 2 x 10 red TB Step up x 10 bilat 6'' step SLR AAROM x 10 bilat LTR small range x 2 minutes Supine clam red TB x 20 Manual Therapy: STM Rt lumbar paraspinals and glutes  Modalities: Moist heat lumbar/glutes x 10 min Tens Rt lumbar and glutes x 10 min to tolerance                                                                                                                                PATIENT EDUCATION:  Education details: PT POC and goals, TENS, HEP Person educated: Patient Education method: Explanation, Demonstration, and Handouts Education comprehension: verbalized understanding and returned demonstration  HOME EXERCISE PROGRAM: Access Code: [email or web address] URL: [email or web address] Date: 02/28/2023 Prepared by: Reggy Eye  Exercises - Supine Bridge  - 1 x daily - 7 x weekly - 3 sets - 10 reps - Hip Abduction with Resistance Loop  - 1 x daily - 7 x weekly -  3 sets - 10 reps - Standing Hip Extension Kicks  - 1 x daily - 7 x weekly - 3 sets - 10 reps - Standing Hip Flexion with Resistance Loop  - 1 x daily - 7 x weekly - 3 sets - 10 reps  ASSESSMENT:  CLINICAL IMPRESSION: Extensive discussion with patient on his PT POC and review of MRI results on hip (taken prior to PT referral). STM performed to address subjective pain symptoms in R glutes, however patient reported area felt "like it's starting to flare up" and treatment discontinued. Hip mobility and strengthening exercises performed with focus on hip isometric strengthening and HS stretches.    GOALS: Goals reviewed with patient? Yes  SHORT TERM GOALS: Target date: 03/14/2023    Pt will be independent with initial HEP Baseline: Goal status: INITIAL   LONG TERM GOALS: Target date: 04/11/2023    Pt will be independent in advanced HEP Baseline:  Goal status: INITIAL  2.  Pt will improve LE strength to 4+/5 to improve standing and walking tolerance Baseline:  Goal status: INITIAL  3.  Pt will tolerate walking x 10 minutes with pain <= 2/10 Baseline:  Goal status: INITIAL   PLAN:  PT FREQUENCY: 2x/week  PT DURATION: 6 weeks  PLANNED INTERVENTIONS: Therapeutic exercises, Therapeutic activity, Neuromuscular re-education, Balance training, Gait training, Patient/Family education, Self Care, Joint mobilization, Aquatic Therapy, Dry Needling, Electrical stimulation, Cryotherapy, Moist heat, Taping, Ultrasound, Ionotophoresis /ml Dexamethasone, Manual therapy, and Re-evaluation.  PLAN FOR NEXT SESSION: hip and glute strength, endurance. Add core strength PHYSICAL THERAPY DISCHARGE SUMMARY  Visits from Start of Care: 4  Current functional level related to goals / functional outcomes: Decreasing pain, improving activity  tolerance   Remaining deficits: See above   Education / Equipment: HEP   Patient agrees to discharge. Patient goals were partially met. Patient is being discharged due to not returning since the last visit.  Reggy Eye, PT,DPT04/24/242:20 PM    Sanjuana Mae, PTA 03/13/2023, 12:40 PM

## 2023-03-20 ENCOUNTER — Ambulatory Visit: Payer: Medicare Other

## 2023-03-20 ENCOUNTER — Telehealth: Payer: Self-pay

## 2023-03-20 NOTE — Telephone Encounter (Signed)
Spoke with patient on the phone about missed appointment (patient forgot about appointment). Attempted to schedule next appointment, however patient declined, stating "I feel worse now than before I started therapy". Discussed with patient initial soreness that can arise at start of therapy and encouraged patient to reconsider; patient stated he would take a week to consider to see how he feels and consider whether or not he wants to continue PT.  Helane Gunther, PTA 03/20/2023 9:00AM

## 2023-07-19 ENCOUNTER — Ambulatory Visit
Admission: EM | Admit: 2023-07-19 | Discharge: 2023-07-19 | Disposition: A | Discharge status: Home / Self Care | Payer: Medicare Other | Source: Home / Self Care | Attending: Family Medicine | Admitting: Family Medicine

## 2023-07-19 DIAGNOSIS — R1032 Left lower quadrant pain: Secondary | ICD-10-CM

## 2023-07-19 NOTE — ED Provider Notes (Signed)
[facility]    CSN: [id number] Arrival date & time: 07/19/23  5638      History   Chief Complaint Chief Complaint  Patient presents with   Abdominal Pain    HPI Angel Khan is a 82 y.o. male.   Patient states that he has abdominal pain that started yesterday.  He was out in his yard tending to his chickens when he felt a sudden tearing sensation and pain in his left lower abdomen.  Looks it is very painful with movement.  Appetite is decreased.  No fever or chills.  He does have a history of umbilical hernia surgery and cholecystectomy pain is not improving overnight.  No problems with bowel or bladder  Past Medical History:  Diagnosis Date   Adenomatous colon polyp    Barrett esophagus    CAD (coronary artery disease)    Chronic headaches    Complication of anesthesia    Hard to wake up   COPD (chronic obstructive pulmonary disease) (HCC)    Coronary atherosclerosis    Diverticulitis of colon 11/2011   Diagnosed by CT scan [facility]   Dyspnea    GERD (gastroesophageal reflux disease)    Hemorrhoids    History of kidney stones    HLD (hyperlipidemia)    Horseshoe kidney    HTN (hypertension)    Melanoma (HCC)    Poor short term memory    Pre-diabetes    Sleep apnea    Can not use a cpap   Tracheobronchomalacia     Patient Active Problem List   Diagnosis Date Noted   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   Asymptomatic carotid artery stenosis without infarction, right 03/14/2021   Gastroesophageal reflux disease 04/20/2020   OSA (obstructive sleep apnea) 04/04/2020   Seasonal and perennial allergic rhinitis 04/04/2020   DOE (dyspnea on exertion) 08/04/2019   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 Ferne Reus   COLONOSCOPY W/ BIOPSIES AND POLYPECTOMY  09/08/2007   adenomatous polyps, diverticulosis, external hemorrhoids   COLONOSCOPY WITH PROPOFOL N/A 05/07/2021   Procedure: COLONOSCOPY WITH PROPOFOL;  Surgeon: Iva Boop, MD;  [facility]: [facility];  Service: Endoscopy;  Laterality: N/A;   CORONARY ARTERY BYPASS GRAFT  07/11/2009   off-pump CABG: LIMA-LAD 07/11/09; occluded graft 09/07/09, s/p DES LAD just after DIAG   CORONARY STENT PLACEMENT     x 5   ENDARTERECTOMY Left 03/14/2021   Procedure: LEFT CAROTID ARTERY ENDARTERECTOMY;  Surgeon: Nada Libman, MD;  Location: [facility];  Service: Vascular;  Laterality: Left;   ESOPHAGOGASTRODUODENOSCOPY (EGD) WITH PROPOFOL N/A 05/07/2021   Procedure: ESOPHAGOGASTRODUODENOSCOPY (EGD) WITH PROPOFOL;  Surgeon: Iva Boop, MD;  Location: [facility];  Service: Endoscopy;  Laterality: N/A;   HERNIA REPAIR     abdominal   LEFT CAROTID ARTERY ENDARTERECTOMY   03/14/2021   POLYPECTOMY  05/07/2021   Procedure: POLYPECTOMY;  Surgeon: Iva Boop, MD;  Location: [facility];  Service: Endoscopy;;   TONSILLECTOMY     TRANSFORAMINAL LUMBAR INTERBODY FUSION W/ MIS 2 LEVEL N/A 04/22/2022   Procedure: Lumbar Four-Five, Lumbar Five-Sacral One Minimally Invasive Laminectomies with Transforaminal Lumbar Interbody Fusion;  Surgeon: Jadene Pierini, MD;  Location: [facility];  Service: Neurosurgery;  Laterality: N/A;   UMBILICAL HERNIA REPAIR     UPPER GASTROINTESTINAL ENDOSCOPY  10/12/2010   barrett's, fondic gland polyps, duodenitis       Home Medications    Prior to Admission medications   Medication Sig Start Date End  Date Taking? Authorizing Provider  Ascorbic Acid (VITAMIN C WITH ROSE HIPS) 500 MG tablet Take 1,000 mg by mouth daily.    [provider]  aspirin 325 MG EC tablet Take 1 tablet (325 mg total) by mouth at bedtime. Restart on 04/25/22 04/23/22   Jadene Pierini, MD  cloNIDine (CATAPRES) 0.1 MG tablet Take 0.1 mg by mouth daily.    [provider]  clopidogrel (PLAVIX) 75 MG tablet Take 1 tablet (75 mg total) by mouth at bedtime. Restart on 04/27/22 04/23/22   Jadene Pierini, MD  dicyclomine (BENTYL) 20 MG tablet Take 1 tablet (20 mg total) by mouth 3 (three) times daily before meals. 04/05/21   Iva Boop, MD  ezetimibe (ZETIA) 10 MG tablet Take 10 mg by mouth every morning.    [provider]  famotidine (PEPCID) 20 MG tablet Take 20 mg by mouth daily.    [provider]  isosorbide mononitrate (IMDUR) 60 MG 24 hr tablet Take 60 mg by mouth at bedtime.    [provider]  losartan (COZAAR) 100 MG tablet Take 100 mg by mouth daily.    [provider]  montelukast (SINGULAIR) 10 MG tablet Take 1 tablet (10 mg total) by mouth at bedtime. 07/01/19   Nyoka Cowden, MD  pantoprazole (PROTONIX) 40 MG tablet Take 1 tablet (40 mg total) by mouth daily. 12/14/19   Iva Boop, MD  pravastatin (PRAVACHOL) 20 MG tablet Take 20 mg by mouth daily.    [provider]  Respiratory Therapy Supplies (FLUTTER) DEVI by Does not apply route as directed.    [provider]  sertraline (ZOLOFT) 50 MG tablet Take 50 mg by mouth daily. 05/08/20   [provider]  terbinafine (LAMISIL) 250 MG tablet Take 250 mg by mouth daily.    [provider]  vitamin B-12 (CYANOCOBALAMIN) 1000 MCG tablet Take 1,000 mcg by mouth daily.    [provider]    Family History Family History  Problem Relation Age of Onset   Colitis Other        niece   Diabetes Mother    Heart disease Mother    Heart disease Father    Heart  disease Brother    Rheumatic fever Sister    Colon cancer Neg Hx    Stomach cancer Neg Hx    Pancreatic cancer Neg Hx     Social History Social History   Tobacco Use   Smoking status: Former    Current packs/day: 0.00    Average packs/day: 1 pack/day for 40.0 years (40.0 ttl pk-yrs)    Types: Cigarettes    Start date: 07/30/1936    Quit date: 07/30/1976    Years since quitting: 47.0   Smokeless tobacco: Never  Vaping Use   Vaping status: Never Used  Substance Use Topics   Alcohol use: No   Drug use: No  Allergies   Depakote [divalproex sodium], Statins, and Magnesium   Review of Systems Review of Systems  See HPI Physical Exam Triage Vital Signs ED Triage Vitals  Encounter Vitals Group     BP 07/19/23 0837 (!) 154/91     Systolic BP Percentile --      Diastolic BP Percentile --      Pulse Rate 07/19/23 0837 72     Resp 07/19/23 0837 16     Temp 07/19/23 0837 98.6 F (37 C)     Temp Source 07/19/23 0837 Oral     SpO2 07/19/23 0837 95 %     Weight --      Height --      Head Circumference --      Peak Flow --      Pain Score 07/19/23 0838 9     Pain Loc --      Pain Education --      Exclude from Growth Chart --    No data found.  Updated Vital Signs BP (!) 154/91 (BP Location: Left Arm)   Pulse 72   Temp 98.6 F (37 C) (Oral)   Resp 16   SpO2 95%       Physical Exam Constitutional:      General: He is in acute distress.     Appearance: He is well-developed.     Comments: Acutely uncomfortable.  Guarded slow movements.  HENT:     Head: Normocephalic and atraumatic.  Eyes:     Conjunctiva/sclera: Conjunctivae normal.     Pupils: Pupils are equal, round, and reactive to light.  Cardiovascular:     Rate and Rhythm: Normal rate.  Pulmonary:     Effort: Pulmonary effort is normal. No respiratory distress.  Abdominal:     General: Abdomen is protuberant. A surgical scar is present. Bowel sounds are normal. There is no distension.      Palpations: Abdomen is soft.     Tenderness: There is abdominal tenderness in the left lower quadrant.  Musculoskeletal:        General: Normal range of motion.     Cervical back: Normal range of motion.  Skin:    General: Skin is warm and dry.  Neurological:     Mental Status: He is alert.      UC Treatments / Results  Labs (all labs ordered are listed, but only abnormal results are displayed) Labs Reviewed - No data to display  EKG   Radiology No results found.  Procedures Procedures (including critical care time)  Medications Ordered in UC Medications - No data to display  Initial Impression / Assessment and Plan / UC Course  I have reviewed the triage vital signs and the nursing notes.  Pertinent labs & imaging results that were available during my care of the patient were reviewed by me and considered in my medical decision making (see chart for details).     Patient has an acute tender abdomen.  Suspect hernia.  He needs additional imaging and care that cannot be provided through this urgent care center Final Clinical Impressions(s) / UC Diagnoses   Final diagnoses:  Left lower quadrant abdominal pain     Discharge Instructions      Go directly to the ER   ED Prescriptions   None    PDMP not reviewed this encounter.   Eustace Moore, MD 07/19/23 [id number]

## 2023-07-19 NOTE — ED Notes (Signed)
Patient is being discharged from the Urgent Care and sent to the Emergency Department via POV . Per Lindaann Slough MD, patient is in need of higher level of care due to severe abdominal pain. Patient is aware and verbalizes understanding of plan of care.  Called report to [facility] ED, gave report to Alyssa RN Vitals:   07/19/23 0837  BP: (!) 154/91  Pulse: 72  Resp: 16  Temp: 98.6 F (37 C)  SpO2: 95%

## 2023-07-19 NOTE — ED Triage Notes (Addendum)
Reports suprapubic and LLQ pain starting yesterday. Reports the pain started suddenly while he was taking care of his chickens. States his son noticed the swelling as he points to his LLQ. Area is exquistly tender to touch, appears diffusely swollen, just lateral to a chronic periumbilical hernia. Denies any N/V/D, changes to bowel or bladder habits. Has not tried any medications at home to help. 9/10 pain, worsening with movement

## 2023-07-19 NOTE — Discharge Instructions (Signed)
Go directly to the ER.

## 2023-09-30 ENCOUNTER — Ambulatory Visit
Admission: EM | Admit: 2023-09-30 | Discharge: 2023-09-30 | Disposition: A | Discharge status: Home / Self Care | Payer: Medicare Other

## 2023-09-30 DIAGNOSIS — R0982 Postnasal drip: Secondary | ICD-10-CM

## 2023-09-30 DIAGNOSIS — J0110 Acute frontal sinusitis, unspecified: Secondary | ICD-10-CM

## 2023-09-30 MED ORDER — AMOXICILLIN-POT CLAVULANATE 875-125 MG PO TABS: 1.0000 | ORAL_TABLET | Freq: Two times a day (BID) | ORAL | 0 refills | Status: AC

## 2023-09-30 NOTE — ED Provider Notes (Signed)
[facility]    CSN: [id number] Arrival date & time: 09/30/23  1142      History   Chief Complaint Chief Complaint  Patient presents with   Cough   Nasal Congestion    HPI Angel Khan is a 82 y.o. male.   Pleasant 82 year old male with chronic sleep apnea and dyspnea on exertion presents today with a sore throat, sinus pain, nasal congestion, and cough for the past week.  He was seen by neurology last week for frontal headache which was reproducible to palpation.  He was started on butalbital which he states helped mildly, but he continues to have this discomfort.  He reports a sore throat and a productive cough.  He has dyspnea on exertion and shortness of breath at baseline, and states they are not any worse than normal.  He denies wheezing or tightness in his chest.  He denies any chest pain or palpitations.  He has been taking over-the-counter DayQuil and NyQuil and states that this has helped somewhat with the amount of mucus.  He denies a known fever.  His main concern is the drainage and soreness in his throat.   Cough   Past Medical History:  Diagnosis Date   Adenomatous colon polyp    Barrett esophagus    CAD (coronary artery disease)    Chronic headaches    Complication of anesthesia    Hard to wake up   COPD (chronic obstructive pulmonary disease) (HCC)    Coronary atherosclerosis    Diverticulitis of colon 11/2011   Diagnosed by CT scan [facility]   Dyspnea    GERD (gastroesophageal reflux disease)    Hemorrhoids    History of kidney stones    HLD (hyperlipidemia)    Horseshoe kidney    HTN (hypertension)    Melanoma (HCC)    Poor short term memory    Pre-diabetes    Sleep apnea    Can not use a cpap   Tracheobronchomalacia     Patient Active Problem List   Diagnosis Date Noted   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   Asymptomatic carotid artery stenosis without infarction, right 03/14/2021    Gastroesophageal reflux disease 04/20/2020   OSA (obstructive sleep apnea) 04/04/2020   Seasonal and perennial allergic rhinitis 04/04/2020   DOE (dyspnea on exertion) 08/04/2019   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 Ferne Reus   COLONOSCOPY W/ BIOPSIES AND POLYPECTOMY  09/08/2007   adenomatous polyps, diverticulosis, external hemorrhoids   COLONOSCOPY WITH PROPOFOL N/A 05/07/2021   Procedure: COLONOSCOPY WITH PROPOFOL;  Surgeon: Iva Boop, MD;  [facility];  Service: Endoscopy;  Laterality: N/A;   CORONARY ARTERY BYPASS GRAFT  07/11/2009   off-pump CABG: LIMA-LAD 07/11/09; occluded graft 09/07/09, s/p DES LAD just after DIAG   CORONARY STENT PLACEMENT     x 5   ENDARTERECTOMY Left 03/14/2021   Procedure: LEFT CAROTID ARTERY ENDARTERECTOMY;  Surgeon: Nada Libman, MD;  Location: [facility] OR;  Service: Vascular;  Laterality: Left;   ESOPHAGOGASTRODUODENOSCOPY (EGD) WITH PROPOFOL N/A 05/07/2021   Procedure: ESOPHAGOGASTRODUODENOSCOPY (EGD) WITH PROPOFOL;  Surgeon: Iva Boop, MD;  [facility];  Service: Endoscopy;  Laterality: N/A;   HERNIA REPAIR     abdominal   LEFT CAROTID ARTERY ENDARTERECTOMY   03/14/2021   POLYPECTOMY  05/07/2021   Procedure: POLYPECTOMY;  Surgeon: Iva Boop, MD;  [facility];  Service: Endoscopy;;   TONSILLECTOMY     TRANSFORAMINAL LUMBAR INTERBODY FUSION W/ MIS 2 LEVEL N/A 04/22/2022   Procedure:  Lumbar Four-Five, Lumbar Five-Sacral One Minimally Invasive Laminectomies with Transforaminal Lumbar Interbody Fusion;  Surgeon: Jadene Pierini, MD;  Location: [facility];  Service: Neurosurgery;  Laterality: N/A;   UMBILICAL HERNIA REPAIR     UPPER GASTROINTESTINAL ENDOSCOPY  10/12/2010   barrett's, fondic gland polyps, duodenitis       Home Medications    Prior to Admission medications   Medication Sig Start Date End Date Taking? Authorizing Provider  amoxicillin-clavulanate (AUGMENTIN) 875-125 MG tablet Take 1 tablet by mouth 2 (two) times daily with a meal for 7 days. 09/30/23 10/07/23 Yes Ronell Boldin L, PA  Ascorbic Acid (VITAMIN C WITH ROSE HIPS) 500 MG tablet Take 1,000 mg by mouth daily.    [provider]  aspirin 325 MG EC tablet Take 1 tablet (325 mg total) by mouth at bedtime. Restart on 04/25/22 04/23/22   Jadene Pierini, MD  cholestyramine Lanetta Inch) 4 g packet Take 1 packet by mouth 2 (two) times daily.    [provider]  cloNIDine (CATAPRES) 0.1 MG tablet Take 0.1 mg by mouth daily.    [provider]  clopidogrel (PLAVIX) 75 MG tablet Take 1 tablet (75 mg total) by mouth at bedtime. Restart on 04/27/22 04/23/22   Jadene Pierini, MD  ezetimibe (ZETIA) 10 MG tablet Take 10 mg by mouth every morning.    [provider]  famotidine (PEPCID) 20 MG tablet Take 20 mg by mouth daily.    [provider]  isosorbide mononitrate (IMDUR) 60 MG 24 hr tablet Take 60 mg by mouth at bedtime.    [provider]  losartan (COZAAR) 100 MG tablet Take 100 mg by mouth daily.    [provider]  montelukast (SINGULAIR) 10 MG tablet Take 1 tablet (10 mg total) by mouth at bedtime. 07/01/19   Nyoka Cowden, MD  pravastatin (PRAVACHOL) 20 MG tablet Take 20 mg by mouth daily.    [provider]  Respiratory Therapy Supplies (FLUTTER) DEVI by Does not apply route as directed.    [provider]  sertraline  (ZOLOFT) 50 MG tablet Take 50 mg by mouth daily. 05/08/20   [provider]  vitamin B-12 (CYANOCOBALAMIN) 1000 MCG tablet Take 1,000 mcg by mouth daily.    [provider]    Family History Family History  Problem Relation Age of Onset   Colitis Other        niece   Diabetes Mother    Heart disease Mother    Heart disease Father    Heart disease Brother    Rheumatic fever Sister    Colon cancer Neg Hx    Stomach cancer Neg Hx    Pancreatic cancer Neg Hx     Social History Social History   Tobacco Use   Smoking status: Former    Current packs/day: 0.00  Average packs/day: 1 pack/day for 40.0 years (40.0 ttl pk-yrs)    Types: Cigarettes    Start date: 07/30/1936    Quit date: 07/30/1976    Years since quitting: 47.2   Smokeless tobacco: Never  Vaping Use   Vaping status: Never Used  Substance Use Topics   Alcohol use: No   Drug use: No     Allergies   Depakote [divalproex sodium], Statins, and Magnesium   Review of Systems Review of Systems  Respiratory:  Positive for cough.   As per HPI   Physical Exam Triage Vital Signs ED Triage Vitals  Encounter Vitals Group     BP 09/30/23 1146 (!) 179/80     Systolic BP Percentile --      Diastolic BP Percentile --      Pulse Rate 09/30/23 1146 82     Resp 09/30/23 1146 17     Temp 09/30/23 1146 97.8 F (36.6 C)     Temp Source 09/30/23 1146 Oral     SpO2 09/30/23 1146 97 %     Weight --      Height --      Head Circumference --      Peak Flow --      Pain Score 09/30/23 1148 0     Pain Loc --      Pain Education --      Exclude from Growth Chart --    No data found.  Updated Vital Signs BP (!) 179/80 (BP Location: Right Arm)   Pulse 82   Temp 97.8 F (36.6 C) (Oral)   Resp 17   SpO2 97%   Visual Acuity Right Eye Distance:   Left Eye Distance:   Bilateral Distance:    Right Eye Near:   Left Eye Near:    Bilateral Near:     Physical Exam Vitals and nursing note reviewed.   Constitutional:      General: He is not in acute distress.    Appearance: Normal appearance. He is not ill-appearing, toxic-appearing or diaphoretic.  HENT:     Head: Normocephalic and atraumatic.     Salivary Glands: Right salivary gland is not diffusely enlarged or tender. Left salivary gland is not diffusely enlarged or tender.     Comments: Skin changes to tip of nose    Right Ear: Decreased hearing noted. No swelling or tenderness. No middle ear effusion. Tympanic membrane is scarred. Tympanic membrane is not injected, perforated or erythematous.     Left Ear: Decreased hearing noted. No swelling or tenderness.  No middle ear effusion. Tympanic membrane is scarred. Tympanic membrane is not injected, perforated or erythematous.     Nose:     Right Sinus: Frontal sinus tenderness present. No maxillary sinus tenderness.     Left Sinus: Frontal sinus tenderness present. No maxillary sinus tenderness.     Mouth/Throat:     Lips: Pink.     Mouth: Mucous membranes are moist.     Palate: No mass and lesions.     Pharynx: Oropharynx is clear. Uvula midline. Posterior oropharyngeal erythema and postnasal drip (thick, green, worse on R) present. No pharyngeal swelling, oropharyngeal exudate or uvula swelling.  Cardiovascular:     Rate and Rhythm: Normal rate and regular rhythm.  Pulmonary:     Effort: Pulmonary effort is normal. No respiratory distress.     Breath sounds: Normal breath sounds. No stridor. No wheezing, rhonchi or rales.  Chest:     Chest wall:  No tenderness.  Musculoskeletal:     Cervical back: Normal range of motion and neck supple. No rigidity or tenderness.  Lymphadenopathy:     Cervical: No cervical adenopathy.  Skin:    General: Skin is warm.  Neurological:     General: No focal deficit present.     Mental Status: He is alert and oriented to person, place, and time.      UC Treatments / Results  Labs (all labs ordered are listed, but only abnormal results are  displayed) Labs Reviewed - No data to display  EKG   Radiology No results found.  Procedures Procedures (including critical care time)  Medications Ordered in UC Medications - No data to display  Initial Impression / Assessment and Plan / UC Course  I have reviewed the triage vital signs and the nursing notes.  Pertinent labs & imaging results that were available during my care of the patient were reviewed by me and considered in my medical decision making (see chart for details).     Acute sinusitis - pt with frontal headache not improving with fioricet. Sx consistent with sinusitis, reproducible to palpation. Will start Augmentin BID x 7 days. Encouraged pt to use saline rinses, pt has [facility] sinus packets at home and understands how to properly administer them. Post nasal drainage - proper use of sinus saline lavages recommended. Nasal passage too dry to recommend flonase or atrovent at this point   Final Clinical Impressions(s) / UC Diagnoses   Final diagnoses:  Acute non-recurrent frontal sinusitis  Post-nasal drainage     Discharge Instructions      You have a sinus infection.  Please start taking the antibiotic, Augmentin, twice daily with food. Take it for all 7 days, do not stop early just because you feel better. Take an over the counter probiotic or yogurt daily to help prevent adverse side effects.  It is also recommended that you use nasal saline/ sinus washes to cleans the sinus passages. Please read the attached handout.  Hot steam from a shower or vaporizer may also be beneficial to help open up the upper airway. Eucalyptus can be helpful.  If any worsening symptoms such as persistent headache, fever, or shortness of breath, please return for recheck.      ED Prescriptions     Medication Sig Dispense Auth. Provider   amoxicillin-clavulanate (AUGMENTIN) 875-125 MG tablet Take 1 tablet by mouth 2 (two) times daily with a meal for 7 days. 14 tablet  Amanii Snethen L, [place]      PDMP not reviewed this encounter.   Maretta Bees, [place] 09/30/23 1312

## 2023-09-30 NOTE — ED Triage Notes (Signed)
Pt c/o cough, congestion, and fatigue x 1 week. Dayquil and nyquil prn.

## 2023-09-30 NOTE — Discharge Instructions (Signed)
You have a sinus infection.  Please start taking the antibiotic, Augmentin, twice daily with food. Take it for all 7 days, do not stop early just because you feel better. Take an over the counter probiotic or yogurt daily to help prevent adverse side effects.  It is also recommended that you use nasal saline/ sinus washes to cleans the sinus passages. Please read the attached handout.  Hot steam from a shower or vaporizer may also be beneficial to help open up the upper airway. Eucalyptus can be helpful.  If any worsening symptoms such as persistent headache, fever, or shortness of breath, please return for recheck.

## 2023-10-09 ENCOUNTER — Other Ambulatory Visit: Payer: Self-pay | Admitting: Neurological Surgery

## 2023-10-09 DIAGNOSIS — S32009K Unspecified fracture of unspecified lumbar vertebra, subsequent encounter for fracture with nonunion: Secondary | ICD-10-CM

## 2023-10-31 ENCOUNTER — Telehealth: Payer: Self-pay | Admitting: Internal Medicine

## 2023-10-31 NOTE — Telephone Encounter (Signed)
Spoke with patient & he says he has noticed a change in his bowel movements daily over the last 2 months, and has been experiencing some fecal incontinence. PCP advised him to discontinue dicyclomine which he was previously was taking, although he did not notice much improvement in symptoms with medication. Pt last seen with Dr. Leone Payor in 2022. Scheduled OV for 11/5 at 2:10 pm with Dr. Leone Payor. Pt plans to attend.

## 2023-10-31 NOTE — Telephone Encounter (Signed)
Patient called requesting to speak with a nurse stated he has bowel issues. Please advise.

## 2023-11-04 ENCOUNTER — Ambulatory Visit (INDEPENDENT_AMBULATORY_CARE_PROVIDER_SITE_OTHER): Payer: Medicare Other | Admitting: Internal Medicine

## 2023-11-04 ENCOUNTER — Encounter: Payer: Self-pay | Admitting: Internal Medicine

## 2023-11-04 ENCOUNTER — Ambulatory Visit: Payer: Medicare Other

## 2023-11-04 ENCOUNTER — Other Ambulatory Visit: Payer: Self-pay

## 2023-11-04 ENCOUNTER — Ambulatory Visit
Admission: EM | Admit: 2023-11-04 | Discharge: 2023-11-04 | Disposition: A | Discharge status: Home / Self Care | Payer: Medicare Other | Attending: Family Medicine | Admitting: Family Medicine

## 2023-11-04 VITALS — BP 118/62 | HR 85 | Ht 65.0 in | Wt 180.0 lb

## 2023-11-04 DIAGNOSIS — K580 Irritable bowel syndrome with diarrhea: Secondary | ICD-10-CM

## 2023-11-04 DIAGNOSIS — R059 Cough, unspecified: Secondary | ICD-10-CM | POA: Diagnosis not present

## 2023-11-04 DIAGNOSIS — J441 Chronic obstructive pulmonary disease with (acute) exacerbation: Secondary | ICD-10-CM

## 2023-11-04 DIAGNOSIS — R10819 Abdominal tenderness, unspecified site: Secondary | ICD-10-CM

## 2023-11-04 DIAGNOSIS — J22 Unspecified acute lower respiratory infection: Secondary | ICD-10-CM | POA: Diagnosis not present

## 2023-11-04 MED ORDER — LEVOFLOXACIN 500 MG PO TABS: 500.0000 mg | ORAL_TABLET | Freq: Every day | ORAL | 0 refills | Status: DC

## 2023-11-04 MED ORDER — PREDNISONE 20 MG PO TABS: ORAL_TABLET | ORAL | 0 refills | Status: DC

## 2023-11-04 NOTE — ED Triage Notes (Signed)
Cough x 3 weeks, has been seen twice and been on abx. Reports phlegm is green and sticky. No new sob.

## 2023-11-04 NOTE — Discharge Instructions (Signed)
Continue to drink lots of water Rinse out your sinuses at least once a day Take the antibiotic once a day for 10 days Take your prednisone as directed See your doctor if not improving by next week
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
## 2023-11-04 NOTE — ED Provider Notes (Signed)
[facility]    CSN: [id number] Arrival date & time: 11/04/23  1504      History   Chief Complaint Chief Complaint  Patient presents with   Cough    HPI Angel Khan is a 82 y.o. male.   Pleasant 82 year old gentleman who is known to me from prior visits.  He is here today saying he is having cough for 3 weeks.  Actually he was seen here October 1 and treated for an upper respiratory infection cough and sinusitis.  He took a week of Augmentin.  When this did not give him improvement he went to see his primary care doctor on October 8.  At that time he was treated with a Z-Pak, prednisone, shot of Depo-Medrol, Flovent inhaler.  He has been given Tessalon for cough.  He is here because he states he continues to cough.  He continues to have sinus drainage.  He states that he has bloody foul mucus coming out of his sinuses.  He has some sore throat.  He has a cough with coughing spells.  His chest hurts from the coughing.  He is feeling very tired.  He is feeling more short of breath. Interestingly he tells me that he does not have any lung disease although he has a diagnosis of COPD.  He tells me he does not use inhaler although in the chart it says that he was prescribed Flovent at his last visit.  I will revisit these questions prior to discharge Patient also has a history of heart disease, heart failure, hypertension, hyperlipidemia, GERD with Barrett's esophagus  I also see a diagnosis in his chart of tracheobronchomalacia.  This certainly would contribute to persistent cough and recurring chest infection   Past Medical History:  Diagnosis Date   Adenomatous colon polyp    Barrett esophagus    CAD (coronary artery disease)    Chronic headaches    Complication of anesthesia    Hard to wake up   COPD (chronic obstructive pulmonary disease) (HCC)    Coronary atherosclerosis    Diverticulitis of colon 11/2011   Diagnosed by CT scan [facility]    Dyspnea    GERD (gastroesophageal reflux disease)    Hemorrhoids    History of kidney stones    HLD (hyperlipidemia)    Horseshoe kidney    HTN (hypertension)    Melanoma (HCC)    Poor short term memory    Pre-diabetes    Sleep apnea    Can not use a cpap   Tracheobronchomalacia     Patient Active Problem List   Diagnosis Date Noted   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   Asymptomatic carotid artery stenosis without infarction, right 03/14/2021   Gastroesophageal reflux disease 04/20/2020   OSA (obstructive sleep apnea) 04/04/2020   Seasonal and perennial allergic rhinitis 04/04/2020   DOE (dyspnea on exertion) 08/04/2019   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   Cough variant asthma vs uacs 05/19/2019   Diarrhea 03/22/2015   Chronic cough 12/23/2013   Essential hypertension 12/23/2013   CORONARY ARTERY DISEASE, S/P PTCA 10/08/2010   BARRETTS ESOPHAGUS 10/08/2010   History of colonic polyps 10/08/2010    Past Surgical History:  Procedure Laterality Date   BIOPSY  05/07/2021   Procedure: BIOPSY;  Surgeon: Iva Boop, MD;  [facility]: [facility];  Service: Endoscopy;;   BRAIN SURGERY     Aneurysm 2004   CARDIAC CATHETERIZATION  01/01/2013   CHOLECYSTECTOMY  01/12/2013   Dr.  Greig Castilla [facility]   COLONOSCOPY W/ BIOPSIES AND POLYPECTOMY  09/08/2007   adenomatous polyps, diverticulosis, external hemorrhoids   COLONOSCOPY WITH PROPOFOL N/A 05/07/2021   Procedure: COLONOSCOPY WITH PROPOFOL;  Surgeon: Iva Boop, MD;  Location: [facility];  Service: Endoscopy;  Laterality: N/A;   CORONARY ARTERY BYPASS GRAFT  07/11/2009   off-pump CABG: LIMA-LAD 07/11/09; occluded graft 09/07/09, s/p DES LAD just after DIAG   CORONARY STENT PLACEMENT     x 5   ENDARTERECTOMY Left 03/14/2021   Procedure: LEFT CAROTID ARTERY ENDARTERECTOMY;  Surgeon: Nada Libman, MD;  [facility];  Service: Vascular;  Laterality: Left;   ESOPHAGOGASTRODUODENOSCOPY (EGD) WITH PROPOFOL N/A  05/07/2021   Procedure: ESOPHAGOGASTRODUODENOSCOPY (EGD) WITH PROPOFOL;  Surgeon: Iva Boop, MD;  Location: [facility];  Service: Endoscopy;  Laterality: N/A;   HERNIA REPAIR     abdominal   LEFT CAROTID ARTERY ENDARTERECTOMY   03/14/2021   POLYPECTOMY  05/07/2021   Procedure: POLYPECTOMY;  Surgeon: Iva Boop, MD;  Location: [facility];  Service: Endoscopy;;   TONSILLECTOMY     TRANSFORAMINAL LUMBAR INTERBODY FUSION W/ MIS 2 LEVEL N/A 04/22/2022   Procedure: Lumbar Four-Five, Lumbar Five-Sacral One Minimally Invasive Laminectomies with Transforaminal Lumbar Interbody Fusion;  Surgeon: Jadene Pierini, MD;  Location[facility];  Service: Neurosurgery;  Laterality: N/A;   UMBILICAL HERNIA REPAIR     UPPER GASTROINTESTINAL ENDOSCOPY  10/12/2010   barrett's, fondic gland polyps, duodenitis       Home Medications    Prior to Admission medications   Medication Sig Start Date End Date Taking? Authorizing Provider  levofloxacin (LEVAQUIN) 500 MG tablet Take 1 tablet (500 mg total) by mouth daily. 11/04/23  Yes Eustace Moore, MD  predniSONE (DELTASONE) 20 MG tablet Take 2 a day for 3 days then 1 a day for a week 11/04/23  Yes Eustace Moore, MD  Ascorbic Acid (VITAMIN C WITH ROSE HIPS) 500 MG tablet Take 1,000 mg by mouth daily.    [provider]  aspirin 325 MG EC tablet Take 1 tablet (325 mg total) by mouth at bedtime. Restart on 04/25/22 04/23/22   Jadene Pierini, MD  cholestyramine Lanetta Inch) 4 g packet Take 1 packet by mouth 2 (two) times daily.    [provider]  cloNIDine (CATAPRES) 0.1 MG tablet Take 0.1 mg by mouth daily.    [provider]  clopidogrel (PLAVIX) 75 MG tablet Take 1 tablet (75 mg total) by mouth at bedtime. Restart on 04/27/22 04/23/22   Jadene Pierini, MD  ezetimibe (ZETIA) 10 MG tablet Take 10 mg by mouth every morning.    [provider]  famotidine (PEPCID) 20 MG tablet Take 20 mg by mouth daily.     [provider]  isosorbide mononitrate (IMDUR) 60 MG 24 hr tablet Take 60 mg by mouth at bedtime.    [provider]  losartan (COZAAR) 100 MG tablet Take 100 mg by mouth daily.    [provider]  montelukast (SINGULAIR) 10 MG tablet Take 1 tablet (10 mg total) by mouth at bedtime. 07/01/19   Nyoka Cowden, MD  pravastatin (PRAVACHOL) 20 MG tablet Take 20 mg by mouth daily.    [provider]  Respiratory Therapy Supplies (FLUTTER) DEVI by Does not apply route as directed.    [provider]  sertraline (ZOLOFT) 50 MG tablet Take 50 mg by mouth daily. 05/08/20   [provider]  vitamin B-12 (CYANOCOBALAMIN)  1000 MCG tablet Take 1,000 mcg by mouth daily.    [provider]    Family History Family History  Problem Relation Age of Onset   Colitis Other        niece   Diabetes Mother    Heart disease Mother    Heart disease Father    Heart disease Brother    Rheumatic fever Sister    Colon cancer Neg Hx    Stomach cancer Neg Hx    Pancreatic cancer Neg Hx     Social History Social History   Tobacco Use   Smoking status: Former    Current packs/day: 0.00    Average packs/day: 1 pack/day for 40.0 years (40.0 ttl pk-yrs)    Types: Cigarettes    Start date: 07/30/1936    Quit date: 07/30/1976    Years since quitting: 47.2   Smokeless tobacco: Never  Vaping Use   Vaping status: Never Used  Substance Use Topics   Alcohol use: No   Drug use: No     Allergies   Depakote [divalproex sodium], Statins, and Magnesium   Review of Systems Review of Systems See HPI  Physical Exam Triage Vital Signs ED Triage Vitals  Encounter Vitals Group     BP 11/04/23 1515 (!) 149/81     Systolic BP Percentile --      Diastolic BP Percentile --      Pulse Rate 11/04/23 1515 85     Resp 11/04/23 1515 16     Temp 11/04/23 1515 97.9 F (36.6 C)     Temp Source 11/04/23 1515 Oral     SpO2 11/04/23 1515 97 %     Weight --       Height --      Head Circumference --      Peak Flow --      Pain Score 11/04/23 1520 8     Pain Loc --      Pain Education --      Exclude from Growth Chart --    No data found.  Updated Vital Signs BP (!) 149/81   Pulse 85   Temp 97.9 F (36.6 C) (Oral)   Resp 16   SpO2 97%      Physical Exam Constitutional:      General: He is not in acute distress.    Appearance: He is well-developed and normal weight. He is ill-appearing.  HENT:     Head: Normocephalic and atraumatic.     Right Ear: Tympanic membrane normal.     Left Ear: Tympanic membrane normal.     Nose: Congestion and rhinorrhea present.     Comments: Noticed crooked from old fracture.  Deviated septum.  ReD swollen nasal membranes    Mouth/Throat:     Mouth: Mucous membranes are moist.     Pharynx: No posterior oropharyngeal erythema.  Eyes:     Conjunctiva/sclera: Conjunctivae normal.     Pupils: Pupils are equal, round, and reactive to light.  Cardiovascular:     Rate and Rhythm: Normal rate and regular rhythm.     Heart sounds: Normal heart sounds.  Pulmonary:     Effort: Pulmonary effort is normal. No respiratory distress.     Breath sounds: No rales.     Comments: Bibasilar rales Abdominal:     General: There is no distension.     Palpations: Abdomen is soft.  Musculoskeletal:        General: Normal range of motion.  Cervical back: Normal range of motion.  Skin:    General: Skin is warm and dry.  Neurological:     Mental Status: He is alert.      UC Treatments / Results  Labs (all labs ordered are listed, but only abnormal results are displayed) Labs Reviewed - No data to display  EKG   Radiology No results found.  Procedures Procedures (including critical care time)  Medications Ordered in UC Medications - No data to display  Initial Impression / Assessment and Plan / UC Course  I have reviewed the triage vital signs and the nursing notes.  Pertinent labs & imaging results  that were available during my care of the patient were reviewed by me and considered in my medical decision making (see chart for details).     Patient was discharged prior to final x-ray reading.  This is because there is a delay in radiology at this time.  To my evaluation it looks largely unchanged from his last chest x-ray.  I have a little bit more interstitial lung marking. He does have what sounds like rales in his chest although at 81 he may just have some crackling baseline He has a history of COPD and tracheobronchitis.  With continued symptoms and purulent sputum I think another round of antibiotics is appropriate. I am going to leave him on a low-dose steroid for an additional week to see if this helps Follow with PCP Final Clinical Impressions(s) / UC Diagnoses   Final diagnoses:  LRTI (lower respiratory tract infection)  COPD exacerbation (HCC)     Discharge Instructions      Continue to drink lots of water Rinse out your sinuses at least once a day Take the antibiotic once a day for 10 days Take your prednisone as directed See your doctor if not improving by next week     ED Prescriptions     Medication Sig Dispense Auth. Provider   levofloxacin (LEVAQUIN) 500 MG tablet Take 1 tablet (500 mg total) by mouth daily. 10 tablet Eustace Moore, MD   predniSONE (DELTASONE) 20 MG tablet Take 2 a day for 3 days then 1 a day for a week 13 tablet Delton See Letta Pate, MD      PDMP not reviewed this encounter.   Eustace Moore, MD 11/04/23 [id number]

## 2023-11-05 ENCOUNTER — Telehealth: Payer: Self-pay

## 2023-11-05 MED ORDER — BENZONATATE 100 MG PO CAPS: 200.0000 mg | ORAL_CAPSULE | Freq: Three times a day (TID) | ORAL | 0 refills | Status: DC | PRN

## 2023-11-05 NOTE — Telephone Encounter (Signed)
LMTRC at earliest conveniene. Msg left on nurse line re visit yesterday.

## 2023-11-05 NOTE — Telephone Encounter (Signed)
Pt states Dr Delton See discussed sending in tessalon as well for pt. Per on site provider today, ok to send in to pts pharmacy.

## 2024-02-02 ENCOUNTER — Ambulatory Visit: Payer: Medicare Other

## 2024-02-02 ENCOUNTER — Ambulatory Visit
Admission: EM | Admit: 2024-02-02 | Discharge: 2024-02-02 | Disposition: A | Discharge status: Home / Self Care | Payer: Medicare Other | Attending: Family Medicine | Admitting: Family Medicine

## 2024-02-02 DIAGNOSIS — N50811 Right testicular pain: Secondary | ICD-10-CM | POA: Diagnosis not present

## 2024-02-02 DIAGNOSIS — R059 Cough, unspecified: Secondary | ICD-10-CM | POA: Diagnosis not present

## 2024-02-02 DIAGNOSIS — N50812 Left testicular pain: Secondary | ICD-10-CM

## 2024-02-02 DIAGNOSIS — J069 Acute upper respiratory infection, unspecified: Secondary | ICD-10-CM | POA: Diagnosis not present

## 2024-02-02 DIAGNOSIS — N433 Hydrocele, unspecified: Secondary | ICD-10-CM

## 2024-02-02 MED ORDER — DOXYCYCLINE HYCLATE 100 MG PO CAPS: 100.0000 mg | ORAL_CAPSULE | Freq: Two times a day (BID) | ORAL | 0 refills | Status: DC

## 2024-02-02 MED ORDER — PREDNISONE 20 MG PO TABS: ORAL_TABLET | ORAL | 0 refills | Status: DC

## 2024-02-02 NOTE — ED Provider Notes (Signed)
[facility]    CSN: [id number] Arrival date & time: 02/02/24  0865      History   Chief Complaint Chief Complaint  Patient presents with   Testicle Pain   Cough    HPI Angel Khan is a 83 y.o. male.   HPI 83 year old male presents with left testicle pain and soreness for 1 month.  PMH significant for CAD, COPD, and sleep apnea.  Past Medical History:  Diagnosis Date   Adenomatous colon polyp    Barrett esophagus    CAD (coronary artery disease)    Chronic headaches    Complication of anesthesia    Hard to wake up   COPD (chronic obstructive pulmonary disease) (HCC)    Coronary atherosclerosis    Diverticulitis of colon 11/2011   Diagnosed by CT scan [facility]   Dyspnea    GERD (gastroesophageal reflux disease)    Hemorrhoids    History of kidney stones    HLD (hyperlipidemia)    Horseshoe kidney    HTN (hypertension)    Melanoma (HCC)    Poor short term memory    Pre-diabetes    Sleep apnea    Can not use a cpap   Tracheobronchomalacia     Patient Active Problem List   Diagnosis Date Noted   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   Asymptomatic carotid artery stenosis without infarction, right 03/14/2021   Gastroesophageal reflux disease 04/20/2020   OSA (obstructive sleep apnea) 04/04/2020   Seasonal and perennial allergic rhinitis 04/04/2020   DOE (dyspnea on exertion) 08/04/2019   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 Ferne Reus   COLONOSCOPY W/ BIOPSIES AND POLYPECTOMY  09/08/2007   adenomatous polyps, diverticulosis, external hemorrhoids   COLONOSCOPY WITH PROPOFOL N/A 05/07/2021   Procedure: COLONOSCOPY WITH PROPOFOL;  Surgeon: Iva Boop, MD;  Location: [facility];  Service: Endoscopy;  Laterality: N/A;   CORONARY ARTERY BYPASS GRAFT  07/11/2009   off-pump CABG: LIMA-LAD 07/11/09; occluded graft 09/07/09, s/p DES LAD just after DIAG   CORONARY STENT PLACEMENT     x 5   ENDARTERECTOMY Left 03/14/2021   Procedure: LEFT CAROTID ARTERY ENDARTERECTOMY;  Surgeon: Nada Libman, MD;  [facility]: [facility];  Service: Vascular;  Laterality: Left;   ESOPHAGOGASTRODUODENOSCOPY (EGD) WITH PROPOFOL N/A 05/07/2021   Procedure: ESOPHAGOGASTRODUODENOSCOPY (EGD) WITH PROPOFOL;  Surgeon: Iva Boop, MD;  [facility]: [facility];  Service: Endoscopy;  Laterality: N/A;   HERNIA REPAIR     abdominal   LEFT CAROTID ARTERY ENDARTERECTOMY   03/14/2021   POLYPECTOMY  05/07/2021   Procedure: POLYPECTOMY;  Surgeon: Iva Boop, MD;  [facility]: [facility];  Service: Endoscopy;;   TONSILLECTOMY     TRANSFORAMINAL LUMBAR INTERBODY FUSION W/ MIS 2 LEVEL N/A 04/22/2022   Procedure: Lumbar Four-Five, Lumbar Five-Sacral One Minimally Invasive Laminectomies with Transforaminal Lumbar Interbody Fusion;  Surgeon: Jadene Pierini, MD;  Location: [facility];  Service:  Neurosurgery;  Laterality: N/A;   UMBILICAL HERNIA REPAIR     UPPER GASTROINTESTINAL ENDOSCOPY  10/12/2010   barrett's, fondic gland polyps, duodenitis       Home Medications    Prior to Admission medications   Medication Sig Start Date End Date Taking? Authorizing Provider  doxycycline (VIBRAMYCIN) 100 MG capsule Take 1 capsule (100 mg total) by mouth 2 (two) times daily for 7 days. 02/02/24 02/09/24 Yes Trevor Iha, FNP  predniSONE (DELTASONE) 20 MG tablet Take 3 tabs PO daily x 5 days. 02/02/24  Yes Trevor Iha, FNP   Ascorbic Acid (VITAMIN C WITH ROSE HIPS) 500 MG tablet Take 1,000 mg by mouth daily.    [provider]  aspirin 325 MG EC tablet Take 1 tablet (325 mg total) by mouth at bedtime. Restart on 04/25/22 04/23/22   Jadene Pierini, MD  benzonatate (TESSALON PERLES) 100 MG capsule Take 2 capsules (200 mg total) by mouth 3 (three) times daily as needed for cough. 11/05/23   Trevor Iha, FNP  cholestyramine Lanetta Inch) 4 g packet Take 1 packet by mouth 2 (two) times daily.    [provider]  cloNIDine (CATAPRES) 0.1 MG tablet Take 0.1 mg by mouth daily.    [provider]  clopidogrel (PLAVIX) 75 MG tablet Take 1 tablet (75 mg total) by mouth at bedtime. Restart on 04/27/22 04/23/22   Jadene Pierini, MD  ezetimibe (ZETIA) 10 MG tablet Take 10 mg by mouth every morning.    [provider]  famotidine (PEPCID) 20 MG tablet Take 20 mg by mouth daily.    [provider]  isosorbide mononitrate (IMDUR) 60 MG 24 hr tablet Take 60 mg by mouth at bedtime.    [provider]  losartan (COZAAR) 100 MG tablet Take 100 mg by mouth daily.    [provider]  montelukast (SINGULAIR) 10 MG tablet Take 1 tablet (10 mg total) by mouth at bedtime. 07/01/19   Nyoka Cowden, MD  pravastatin (PRAVACHOL) 20 MG tablet Take 20 mg by mouth daily.    [provider]  Respiratory Therapy Supplies (FLUTTER) DEVI by Does not apply route as directed.    [provider]  sertraline (ZOLOFT) 50 MG tablet Take 50 mg by mouth daily. 05/08/20   [provider]  vitamin B-12 (CYANOCOBALAMIN) 1000 MCG tablet Take 1,000 mcg by mouth daily.    [provider]    Family History Family History  Problem Relation Age of Onset   Colitis Other        niece   Diabetes Mother    Heart disease Mother    Heart disease Father    Heart disease Brother    Rheumatic fever Sister    Colon cancer Neg Hx    Stomach cancer Neg Hx    Pancreatic  cancer Neg Hx     Social History Social History   Tobacco Use   Smoking status: Former    Current packs/day: 0.00    Average packs/day: 1 pack/day for 40.0 years (40.0 ttl pk-yrs)    Types: Cigarettes    Start date: 07/30/1936    Quit date: 07/30/1976    Years since quitting: 47.5   Smokeless tobacco: Never  Vaping Use   Vaping status: Never Used  Substance Use Topics   Alcohol use: No   Drug use: No     Allergies   Depakote [divalproex sodium], Statins, and Magnesium   Review of Systems Review of Systems  Respiratory:  Positive for cough.   Genitourinary:  Positive for scrotal swelling.  All other systems reviewed and are negative.    Physical Exam Triage Vital Signs ED Triage Vitals  Encounter Vitals Group     BP 02/02/24 0938 (!) 153/93     Systolic BP Percentile --      Diastolic BP Percentile --      Pulse Rate 02/02/24 0938 65     Resp 02/02/24 0938 19     Temp 02/02/24 0938 (!) 96.8 F (36 C)     Temp Source 02/02/24 0938 Oral     SpO2 02/02/24 0938 98 %     Weight --      Height --      Head Circumference --      Peak Flow --      Pain Score 02/02/24 0937 2     Pain Loc --      Pain Education --      Exclude from Growth Chart --    No data found.  Updated Vital Signs BP (!) 153/93   Pulse 65   Temp (!) 96.8 F (36 C) (Oral)   Resp 19   SpO2 98%    Physical Exam Vitals and nursing note reviewed. Exam conducted with a chaperone present.  Constitutional:      General: He is not in acute distress.    Appearance: Normal appearance. He is obese. He is not ill-appearing, toxic-appearing or diaphoretic.  HENT:     Head: Normocephalic and atraumatic.     Right Ear: Tympanic membrane, ear canal and external ear normal.     Left Ear: Tympanic membrane, ear canal and external ear normal.     Mouth/Throat:     Mouth: Mucous membranes are moist.     Pharynx: Oropharynx is clear.  Eyes:     Extraocular Movements: Extraocular movements intact.      Conjunctiva/sclera: Conjunctivae normal.     Pupils: Pupils are equal, round, and reactive to light.  Cardiovascular:     Rate and Rhythm: Normal rate and regular rhythm.     Pulses: Normal pulses.     Heart sounds: Normal heart sounds.  Pulmonary:     Effort: Pulmonary effort is normal.     Breath sounds: Normal breath sounds. No wheezing, rhonchi or rales.  Genitourinary:    Penis: Normal.      Comments: Significant swelling of bilateral testicle/scrotum Musculoskeletal:        General: Normal range of motion.     Cervical back: Normal range of motion and neck supple.  Skin:    General: Skin is warm and dry.  Neurological:     General: No focal deficit present.     Mental Status: He is alert and oriented to person, place, and time. Mental status is at baseline.  Psychiatric:        Mood and Affect: Mood normal.        Behavior: Behavior normal.      UC Treatments / Results  Labs (all labs ordered are listed, but only abnormal results are displayed) Labs Reviewed - No data to display  EKG   Radiology US SCROTUM W/DOPPLER Result Date: 02/02/2024 CLINICAL DATA:  Left testicular pain for 1 month.  Swelling. EXAM: SCROTAL ULTRASOUND DOPPLER ULTRASOUND OF THE TESTICLES TECHNIQUE: Complete ultrasound examination of the testicles, epididymis, and other scrotal structures was performed. Color and spectral Doppler ultrasound were also utilized to evaluate blood flow to the testicles.  COMPARISON:  None Available. FINDINGS: Right testicle Measurements: 4.7 x 3.3 x 3.4 cm. No mass or microlithiasis visualized. Left testicle Measurements: 4.1 x 2.7 x 3.2 cm. No mass or microlithiasis visualized. Small calcification within the scrotum compatible with scrotal pearl. Right epididymis:  Small epididymal cyst. Left epididymis:  1 cm epididymal cyst. Hydrocele:  Large bilateral hydroceles. Varicocele:  None visualized. Pulsed Doppler interrogation of both testes demonstrates normal low resistance  arterial and venous waveforms bilaterally. IMPRESSION: No testicular abnormality or evidence of torsion. Large bilateral hydroceles. Electronically Signed   By: Charlett Nose M.D.   On: 02/02/2024 11:54    Procedures Procedures (including critical care time)  Medications Ordered in UC Medications - No data to display  Initial Impression / Assessment and Plan / UC Course  I have reviewed the triage vital signs and the nursing notes.  Pertinent labs & imaging results that were available during my care of the patient were reviewed by me and considered in my medical decision making (see chart for details).     MDM: 1.  Pain in both testicles-ultrasound revealed above; 2.  Bilateral hydrocele-advised patient of ultrasound results and advised if symptoms worsen please follow-up with [facility] health Urology for further evaluation of bilateral hydrocele; 3.  Acute URI-Rx'd Doxycycline 100 mg capsule: Take 1 capsule twice daily x 7 days.  4.  Cough, unspecified type-Rx'd prednisone 20 mg tablet: Take 3 tabs p.o. daily x 5 days. Advised patient of ultrasound of scrotum results with hardcopy provided.  Advised if patient if scrotal pain worsens please follow-up with [facility] health urology for further evaluation.  Advised patient to take medications as directed with food to completion for acute URI and cough.  Patient to take prednisone with first dose of doxycycline for the next 5 of 7 days.  Encouraged increase daily water intake to 64 ounces per day while taking these medications.  Advised if symptoms worsen and/or unresolved please follow-up with PCP or here for further evaluation.  Patient discharged home, hemodynamically stable. Final Clinical Impressions(s) / UC Diagnoses   Final diagnoses:  Pain in both testicles  Cough, unspecified type  Acute URI  Bilateral hydrocele     Discharge Instructions      Advised patient of ultrasound of scrotum results with hardcopy provided.  Advised if patient if  scrotal pain worsens please follow-up with [facility] health urology for further evaluation.  Advised patient to take medications as directed with food to completion for acute URI and cough.  Patient to take prednisone with first dose of doxycycline for the next 5 of 7 days.  Encouraged increase daily water intake to 64 ounces per day while taking these medications.  Advised if symptoms worsen and/or unresolved please follow-up with PCP or here for further evaluation.     ED Prescriptions     Medication Sig Dispense Auth. Provider   doxycycline (VIBRAMYCIN) 100 MG capsule Take 1 capsule (100 mg total) by mouth 2 (two) times daily for 7 days. 14 capsule Trevor Iha, FNP   predniSONE (DELTASONE) 20 MG tablet Take 3 tabs PO daily x 5 days. 15 tablet Trevor Iha, FNP      PDMP not reviewed this encounter.   Trevor Iha, FNP 02/02/24 1219

## 2024-02-02 NOTE — ED Triage Notes (Signed)
Pt presents to uc with co of left testical pain and soreness for one month. Pt reports he thought it was jock itchy but it has gotten worse.   Pt also reports wheezing and cough for 2 weeks.

## 2024-02-02 NOTE — Discharge Instructions (Addendum)
Advised patient of ultrasound of scrotum results with hardcopy provided.  Advised if patient if scrotal pain worsens please follow-up with [facility] health urology for further evaluation.  Advised patient to take medications as directed with food to completion for acute URI and cough.  Patient to take prednisone with first dose of doxycycline for the next 5 of 7 days.  Encouraged increase daily water intake to 64 ounces per day while taking these medications.  Advised if symptoms worsen and/or unresolved please follow-up with PCP or here for further evaluation.

## 2024-02-04 ENCOUNTER — Ambulatory Visit (INDEPENDENT_AMBULATORY_CARE_PROVIDER_SITE_OTHER): Payer: Medicare Other | Admitting: Urology

## 2024-02-04 ENCOUNTER — Encounter: Payer: Self-pay | Admitting: Urology

## 2024-02-04 VITALS — BP 139/65 | HR 83 | Ht 65.0 in | Wt 182.0 lb

## 2024-02-04 DIAGNOSIS — R399 Unspecified symptoms and signs involving the genitourinary system: Secondary | ICD-10-CM | POA: Diagnosis not present

## 2024-02-04 DIAGNOSIS — K4090 Unilateral inguinal hernia, without obstruction or gangrene, not specified as recurrent: Secondary | ICD-10-CM | POA: Diagnosis not present

## 2024-02-04 LAB — URINALYSIS, ROUTINE W REFLEX MICROSCOPIC
Bilirubin, UA: NEGATIVE
Glucose, UA: NEGATIVE
Ketones, UA: NEGATIVE
Leukocytes,UA: NEGATIVE
Nitrite, UA: NEGATIVE
Protein,UA: NEGATIVE
RBC, UA: NEGATIVE
Specific Gravity, UA: >1.03 — ABNORMAL HIGH (ref 1.005–1.030)
Urobilinogen, Ur: 0.2 mg/dL (ref 0.2–1.0)
pH, UA: 5.5 (ref 5.0–7.5)

## 2024-02-04 MED ORDER — TAMSULOSIN HCL 0.4 MG PO CAPS: 0.8000 mg | ORAL_CAPSULE | Freq: Every day | ORAL | 11 refills | Status: AC

## 2024-02-04 NOTE — Progress Notes (Signed)
 Assessment: 1. Left inguinal hernia   2. Lower urinary tract symptoms (LUTS)      Plan: Today I discussed with the patient that it appears that his discomfort is related to a tender but reducible left inguinal hernia.  The patient is scheduled in the next week or so to have cervical spine surgery as he apparently has some degree of cord compression.  I told him that this should take precedent but we will place referral for general surgery for evaluation.  Concerning his lower urinary tract symptoms.  I recommend that he double up on the tamsulosin  taking 0.8 mg daily.  We will see him back in 4 months for symptom assessment and residual urine determination.  Chief Complaint: No chief complaint on file.   History of Present Illness:  Angel Khan is a 83 y.o. male who is seen in consultation from [name], NP for evaluation of testicle pain. Patient recently presented to the urgent care center complaining of a number of complaints including LEFT sided scrotal discomfort that had apparently been present for about a month.  Patient underwent scrotal ultrasound which demonstrated normal testicles bilaterally.  There were incidental findings of bilateral small epididymal cyst as well as hydroceles.  Patient also reports significant lower urinary tract symptoms.  He took tamsulosin  for a few weeks and was subsequently switched to Uroxatrol.  He noted only mild benefit.  On exam today- patient has obvious tender but reducible large left inguinal hernia.  This is where he reports pain and discomfort not testes. DRE reveals a 30 to 40 g benign feeling prostate PSA 08/2023= 1.2    Past Medical History:  Past Medical History:  Diagnosis Date   Adenomatous colon polyp    Barrett esophagus    CAD (coronary artery disease)    Chronic headaches    Complication of anesthesia    Hard to wake up   COPD (chronic obstructive pulmonary disease) (HCC)    Coronary atherosclerosis     Diverticulitis of colon 11/2011   Diagnosed by CT scan [facility]   Dyspnea    GERD (gastroesophageal reflux disease)    Hemorrhoids    History of kidney stones    HLD (hyperlipidemia)    Horseshoe kidney    HTN (hypertension)    Melanoma (HCC)    Poor short term memory    Pre-diabetes    Sleep apnea    Can not use a cpap   Tracheobronchomalacia     Past Surgical History:  Past Surgical History:  Procedure Laterality Date   BIOPSY  05/07/2021   Procedure: BIOPSY;  Surgeon: Avram Lupita BRAVO, MD;  [facility]: [facility];  Service: Endoscopy;;   BRAIN SURGERY     Aneurysm 2004   CARDIAC CATHETERIZATION  01/01/2013   CHOLECYSTECTOMY  01/12/2013   Dr. Prentice El   COLONOSCOPY W/ BIOPSIES AND POLYPECTOMY  09/08/2007   adenomatous polyps, diverticulosis, external hemorrhoids   COLONOSCOPY WITH PROPOFOL  N/A 05/07/2021   Procedure: COLONOSCOPY WITH PROPOFOL ;  Surgeon: Avram Lupita BRAVO, MD;  [facility];  Service: Endoscopy;  Laterality: N/A;   CORONARY ARTERY BYPASS GRAFT  07/11/2009   off-pump CABG: LIMA-LAD 07/11/09; occluded graft 09/07/09, s/p DES LAD just after DIAG   CORONARY STENT PLACEMENT     x 5   ENDARTERECTOMY Left 03/14/2021   Procedure: LEFT CAROTID ARTERY ENDARTERECTOMY;  Surgeon: Serene Gaile ORN, MD;  [facility]: [facility];  Service: Vascular;  Laterality: Left;   ESOPHAGOGASTRODUODENOSCOPY (EGD) WITH  PROPOFOL  N/A 05/07/2021   Procedure: ESOPHAGOGASTRODUODENOSCOPY (EGD) WITH PROPOFOL ;  Surgeon: Avram Lupita BRAVO, MD;  [facility]: [facility];  Service: Endoscopy;  Laterality: N/A;   HERNIA REPAIR     abdominal   LEFT CAROTID ARTERY ENDARTERECTOMY   03/14/2021   POLYPECTOMY  05/07/2021   Procedure: POLYPECTOMY;  Surgeon: Avram Lupita BRAVO, MD;  [facility];  Service: Endoscopy;;   TONSILLECTOMY     TRANSFORAMINAL LUMBAR INTERBODY FUSION W/ MIS 2 LEVEL N/A 04/22/2022   Procedure: Lumbar Four-Five, Lumbar Five-Sacral One Minimally Invasive  Laminectomies with Transforaminal Lumbar Interbody Fusion;  Surgeon: Cheryle Debby LABOR, MD;  Location: [facility];  Service: Neurosurgery;  Laterality: N/A;   UMBILICAL HERNIA REPAIR     UPPER GASTROINTESTINAL ENDOSCOPY  10/12/2010   barrett's, fondic gland polyps, duodenitis    Allergies:  Allergies  Allergen Reactions   Depakote [Divalproex Sodium]     Hair loss    Statins Other (See Comments)    Muscle cramps   Magnesium  Rash    Liquid     Family History:  Family History  Problem Relation Age of Onset   Colitis Other        niece   Diabetes Mother    Heart disease Mother    Heart disease Father    Heart disease Brother    Rheumatic fever Sister    Colon cancer Neg Hx    Stomach cancer Neg Hx    Pancreatic cancer Neg Hx     Social History:  Social History   Tobacco Use   Smoking status: Former    Current packs/day: 0.00    Average packs/day: 1 pack/day for 40.0 years (40.0 ttl pk-yrs)    Types: Cigarettes    Start date: 07/30/1936    Quit date: 07/30/1976    Years since quitting: 47.5   Smokeless tobacco: Never  Vaping Use   Vaping status: Never Used  Substance Use Topics   Alcohol use: No   Drug use: No    Review of symptoms:  Constitutional:  Negative for unexplained weight loss, night sweats, fever, chills ENT:  Negative for nose bleeds, sinus pain, painful swallowing CV:  Negative for chest pain, shortness of breath, exercise intolerance, palpitations, loss of consciousness Resp:  Negative for cough, wheezing, shortness of breath GI:  Negative for nausea, vomiting, diarrhea, bloody stools GU:  Positives noted in HPI; otherwise negative for gross hematuria, dysuria, urinary incontinence Neuro:  Negative for seizures, poor balance, limb weakness, slurred speech Psych:  Negative for lack of energy, depression, anxiety Endocrine:  Negative for polydipsia, polyuria, symptoms of hypoglycemia (dizziness, hunger, sweating) Hematologic:  Negative for anemia,  purpura, petechia, prolonged or excessive bleeding, use of anticoagulants  Allergic:  Negative for difficulty breathing or choking as a result of exposure to anything; no shellfish allergy ; no allergic response (rash/itch) to materials, foods  Physical exam: BP 139/65   Pulse 83   Ht 5' 5 (1.651 m)   Wt 182 lb (82.6 kg)   BMI 30.29 kg/m  GENERAL APPEARANCE:  Well appearing, well developed, well nourished, NAD  GU: Normal phallus.  Normal right testis and cord Normal left testis with superior base tender but reducible left inguinal hernia which comes down to the upper scrotum.  DRE: Normal sphincter tone; prostate is approximately 30 g without evidence of nodules or induration  Results: No results found for this or any previous visit (from the past 24 hours).

## 2024-02-05 ENCOUNTER — Ambulatory Visit: Payer: Medicare Other | Admitting: Allergy

## 2024-02-05 ENCOUNTER — Encounter: Payer: Self-pay | Admitting: Allergy

## 2024-02-05 ENCOUNTER — Encounter: Payer: Self-pay | Admitting: Emergency Medicine

## 2024-02-05 ENCOUNTER — Ambulatory Visit
Admission: EM | Admit: 2024-02-05 | Discharge: 2024-02-05 | Disposition: A | Discharge status: Home / Self Care | Payer: Medicare Other | Attending: Family Medicine | Admitting: Family Medicine

## 2024-02-05 ENCOUNTER — Ambulatory Visit: Payer: Medicare Other

## 2024-02-05 VITALS — BP 140/64 | HR 88 | Temp 97.8°F | Resp 14 | Ht 65.0 in | Wt 192.0 lb

## 2024-02-05 DIAGNOSIS — R053 Chronic cough: Secondary | ICD-10-CM

## 2024-02-05 DIAGNOSIS — J3089 Other allergic rhinitis: Secondary | ICD-10-CM

## 2024-02-05 DIAGNOSIS — J988 Other specified respiratory disorders: Secondary | ICD-10-CM | POA: Diagnosis not present

## 2024-02-05 DIAGNOSIS — R062 Wheezing: Secondary | ICD-10-CM

## 2024-02-05 DIAGNOSIS — R030 Elevated blood-pressure reading, without diagnosis of hypertension: Secondary | ICD-10-CM

## 2024-02-05 DIAGNOSIS — K219 Gastro-esophageal reflux disease without esophagitis: Secondary | ICD-10-CM | POA: Diagnosis not present

## 2024-02-05 DIAGNOSIS — R059 Cough, unspecified: Secondary | ICD-10-CM

## 2024-02-05 DIAGNOSIS — J029 Acute pharyngitis, unspecified: Secondary | ICD-10-CM

## 2024-02-05 LAB — POCT INFLUENZA A/B
Influenza A, POC: NEGATIVE
Influenza B, POC: NEGATIVE

## 2024-02-05 LAB — POCT RAPID STREP A (OFFICE): Rapid Strep A Screen: NEGATIVE

## 2024-02-05 LAB — POC SARS CORONAVIRUS 2 AG -  ED: SARS Coronavirus 2 Ag: NEGATIVE

## 2024-02-05 MED ORDER — ALBUTEROL SULFATE HFA 108 (90 BASE) MCG/ACT IN AERS: 2.0000 | INHALATION_SPRAY | RESPIRATORY_TRACT | 1 refills | Status: DC | PRN

## 2024-02-05 MED ORDER — ALBUTEROL SULFATE (2.5 MG/3ML) 0.083% IN NEBU: 2.5000 mg | INHALATION_SOLUTION | RESPIRATORY_TRACT | 1 refills | Status: DC | PRN

## 2024-02-05 NOTE — Progress Notes (Addendum)
 New Patient Note  RE: LEUL Angel Khan MRN: [id number] DOB: 1941/05/08 Date of Office Visit: 02/05/2024  Consult requested by: [name], NP Primary care provider: [name], NP  Chief Complaint: Wheezing (Has had the wheezing for a while, he has a cold that is pretty bad. Coughing while eating )  History of Present Illness: I had the pleasure of seeing Angel Khan for initial evaluation at the [facility] on 02/05/2024. He is a 83 y.o. male, who is self-referred here for the reestablish care.   Patient was seen by me in 2021 for chronic cough, GERD, allergic rhinitis. Failed to follow up as recommended.  Discussed the use of AI scribe software for clinical note transcription with the patient, who gave verbal consent to proceed.  He has been experiencing wheezing and a persistent cough for a couple of months, with the cough being particularly severe after eating. The chronic shortness of breath worsens at night and when lying flat, causing him to wake up due to breathing difficulties. He uses a nasal spray for relief but does not have an inhaler. He experiences these symptoms three to five times a week. No recent COVID infection, with the last known infection four years ago. He is unsure about recent flu or pneumonia vaccinations. He reports a sore throat and nasal congestion, which he attributes to cold weather. No fever, headaches, or frequent infections requiring antibiotics.  He has a history of gastroesophageal reflux disease (GERD), managed with pantoprazole  and Pepcid , which he takes regularly.  He recently visited urgent care for a left testicular rupture and was prescribed prednisone  and an antibiotic (doxycycline ).      He reports symptoms of shortness of breath, coughing, wheezing, nocturnal awakenings for many years but worse the past few months. Current medications include none.  He tried the following inhalers: none.   Main triggers are  unknown but the coughing is worse after meals.  In the last month, frequency of symptoms: 3-5x/week. Interference with physical activity: yes. Sleep is disturbed. In the last 12 months, emergency room visits/urgent care visits/doctor office visits or hospitalizations due to respiratory issues: 0. In the last 12 months, oral steroids courses: yes. Lifetime history of hospitalization for respiratory issues: no. Prior intubations: no. History of pneumonia: no. Smoking exposure: quit. Up to date with flu vaccine: not sure. Up to date with pneumonia vaccine: one. Prior Covid-19 infection: yes. History of reflux: yes - takes pantoprazole  40mg  daily and famotidine  20mg  daily.  Patient is scheduled for neck surgery in February.  Assessment and Plan: [name] is a 83 y.o. male with: Respiratory infection Patient feels like he is coming down with a cold due to sore throat, head congestion and increased coughing that started today. Denies fevers. If you do not feel better, please go to urgent care or ER to get tested for covid and flu. See below for symptomatic management. Finish the prednisone  as prescribed - this will help with any inflammation in the lungs. Get Chest X-ray - no acute process.   Chronic cough and wheezing Past history - Followed by pulmonology, GI, ENT and cardiology. Tried Advair, Dulera, Symbicort , azithromycin  and flutter valve with no benefit. Currently on PPI for reflux. 2020 CXR no acute disease. 2016 IgE 610 positive to cat, dog, grass, dust mites, trees, ragweed, weed. 09/08/2019 eosinophils 400. 2021 spirometry showed some mild restriction. 2021 skin testing positive to grass and dog. No pets at home.  Interim history - New onset wheezing  and coughing for the past couple of months, exacerbated by lying flat and after meals. Already taking PPI and famotidine . Work up in the past showed mild restriction in spirometry with no response to bronchodilator treatment. Did not perform  spirometry today due to current viral symptoms. This seems to be multifactorial in nature. May use albuterol  rescue inhaler 2 puffs every 4 to 6 hours as needed for shortness of breath, chest tightness, coughing, and wheezing. May use albuterol  rescue inhaler 2 puffs 5 to 15 minutes prior to strenuous physical activities. Monitor frequency of use - if you need to use it more than twice per week on a consistent basis let us  know.   Gastroesophageal reflux disease, unspecified whether esophagitis present Continue lifestyle and dietary modifications. Continue pantoprazole  40mg  once day - nothing to eat or drink for 20-30 minutes afterwards.  Continue famotidine  20mg  once a day.  Other allergic rhinitis Past history - 2016 bloodwork IgE 610, positive to cat, dog, grass, dust mites, trees, ragweed, weed. 09/08/2019 eosinophils 400. 2021 skin testing positive to grass and dog only.  Use over the counter antihistamines such as Zyrtec (cetirizine), Claritin (loratadine), Allegra (fexofenadine), or Xyzal (levocetirizine) daily as needed. May switch antihistamines every few months. Continue Singulair  (montelukast ) 10mg  daily at night.  Elevated blood pressure reading Please follow up with PCP regarding this.    Return in about 2 months (around 04/04/2024).  Meds ordered this encounter  Medications   albuterol  (VENTOLIN  HFA) 108 (90 Base) MCG/ACT inhaler    Sig: Inhale 2 puffs into the lungs every 4 (four) hours as needed for wheezing or shortness of breath (coughing fits).    Dispense:  18 g    Refill:  1   albuterol  (PROVENTIL ) (2.5 MG/3ML) 0.083% nebulizer solution    Sig: Take 3 mLs (2.5 mg total) by nebulization every 4 (four) hours as needed for wheezing or shortness of breath (coughing fits).    Dispense:  75 mL    Refill:  1   Lab Orders  No laboratory test(s) ordered today    Other allergy  screening: Rhino conjunctivitis: yes Food allergy : no Medication allergy : no Hymenoptera  allergy : no Urticaria: no Eczema:no History of recurrent infections suggestive of immunodeficency: no  Diagnostics: None.   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/2010   History of colonic polyps 10/08/2010   Past Medical History:  Diagnosis Date   Adenomatous colon polyp    Barrett esophagus    CAD (coronary artery disease)    Chronic headaches    Complication of anesthesia    Hard to wake up   COPD (chronic obstructive pulmonary disease) (HCC)    Coronary atherosclerosis    Diverticulitis of colon 11/2011   Diagnosed by CT scan [facility]   Dyspnea    GERD (gastroesophageal reflux disease)    Hemorrhoids    History of kidney stones    HLD (hyperlipidemia)    Horseshoe kidney    HTN (hypertension)  Melanoma (HCC)    Poor short term memory    Pre-diabetes    Sleep apnea    Can not use a cpap   Tracheobronchomalacia    Urticaria    Past Surgical History: Past Surgical History:  Procedure Laterality Date   ADENOIDECTOMY     BIOPSY  05/07/2021   Procedure: BIOPSY;  Surgeon: Avram Lupita BRAVO, MD;  Location: [facility];  Service: Endoscopy;;   BRAIN SURGERY     Aneurysm 2004   CARDIAC CATHETERIZATION  01/01/2013   CHOLECYSTECTOMY  01/12/2013   Dr. Prentice El   COLONOSCOPY W/ BIOPSIES AND POLYPECTOMY  09/08/2007   adenomatous polyps, diverticulosis, external  hemorrhoids   COLONOSCOPY WITH PROPOFOL  N/A 05/07/2021   Procedure: COLONOSCOPY WITH PROPOFOL ;  Surgeon: Avram Lupita BRAVO, MD;  Location: [facility];  Service: Endoscopy;  Laterality: N/A;   CORONARY ARTERY BYPASS GRAFT  07/11/2009   off-pump CABG: LIMA-LAD 07/11/09; occluded graft 09/07/09, s/p DES LAD just after DIAG   CORONARY STENT PLACEMENT     x 5   ENDARTERECTOMY Left 03/14/2021   Procedure: LEFT CAROTID ARTERY ENDARTERECTOMY;  Surgeon: Serene Gaile ORN, MD;  Location: [facility] OR;  Service: Vascular;  Laterality: Left;   ESOPHAGOGASTRODUODENOSCOPY (EGD) WITH PROPOFOL  N/A 05/07/2021   Procedure: ESOPHAGOGASTRODUODENOSCOPY (EGD) WITH PROPOFOL ;  Surgeon: Avram Lupita BRAVO, MD;  Location: [facility];  Service: Endoscopy;  Laterality: N/A;   HERNIA REPAIR     abdominal   LEFT CAROTID ARTERY ENDARTERECTOMY   03/14/2021   POLYPECTOMY  05/07/2021   Procedure: POLYPECTOMY;  Surgeon: Avram Lupita BRAVO, MD;  Location: [facility];  Service: Endoscopy;;   SINOSCOPY     TONSILLECTOMY     TRANSFORAMINAL LUMBAR INTERBODY FUSION W/ MIS 2 LEVEL N/A 04/22/2022   Procedure: Lumbar Four-Five, Lumbar Five-Sacral One Minimally Invasive Laminectomies with Transforaminal Lumbar Interbody Fusion;  Surgeon: Cheryle Debby LABOR, MD;  Location: [facility] OR;  Service: Neurosurgery;  Laterality: N/A;   UMBILICAL HERNIA REPAIR     UPPER GASTROINTESTINAL ENDOSCOPY  10/12/2010   barrett's, fondic gland polyps, duodenitis   Medication List:  Current Outpatient Medications  Medication Sig Dispense Refill   albuterol  (PROVENTIL ) (2.5 MG/3ML) 0.083% nebulizer solution Take 3 mLs (2.5 mg total) by nebulization every 4 (four) hours as needed for wheezing or shortness of breath (coughing fits). 75 mL 1   albuterol  (VENTOLIN  HFA) 108 (90 Base) MCG/ACT inhaler Inhale 2 puffs into the lungs every 4 (four) hours as needed for wheezing or shortness of breath (coughing fits). 18 g 1   alfuzosin (UROXATRAL) 10 MG 24 hr tablet Take 10 mg by  mouth daily with breakfast.     Ascorbic Acid  (VITAMIN C WITH ROSE HIPS) 500 MG tablet Take 1,000 mg by mouth daily.     aspirin  325 MG EC tablet Take 1 tablet (325 mg total) by mouth at bedtime. Restart on 04/25/22     azithromycin  (ZITHROMAX ) 250 MG tablet Take 250 mg by mouth once.     benzonatate  (TESSALON  PERLES) 100 MG capsule Take 2 capsules (200 mg total) by mouth 3 (three) times daily as needed for cough. 40 capsule 0   cloNIDine  (CATAPRES ) 0.1 MG tablet Take 0.1 mg by mouth daily.     clopidogrel  (PLAVIX ) 75 MG tablet Take 1 tablet (75 mg total) by mouth at bedtime. Restart on 04/27/22     co-enzyme Q-10 30 MG capsule Take 30 mg by mouth 3 (three) times daily.     diclofenac Sodium (VOLTAREN) 1 % GEL  Apply 2 g topically 4 (four) times daily.     ezetimibe  (ZETIA ) 10 MG tablet Take 10 mg by mouth every morning.     famotidine  (PEPCID ) 20 MG tablet Take 20 mg by mouth daily.     gabapentin (NEURONTIN) 100 MG capsule Take 100 mg by mouth at bedtime.     glucosamine-chondroitin 500-400 MG tablet Take 1 tablet by mouth every evening.     HYDROcodone -acetaminophen  (NORCO/VICODIN) 5-325 MG tablet Take 1 tablet by mouth every 4 (four) hours as needed for severe pain (pain score 7-10).     hydrOXYzine (ATARAX) 25 MG tablet Take 25 mg by mouth 3 (three) times daily as needed.     isosorbide  mononitrate (IMDUR ) 60 MG 24 hr tablet Take 60 mg by mouth at bedtime.     loratadine (CLARITIN) 10 MG tablet Take 10 mg by mouth daily.     losartan  (COZAAR ) 100 MG tablet Take 100 mg by mouth daily.     meclizine (ANTIVERT) 25 MG tablet Take 25 mg by mouth 2 (two) times daily.     methylPREDNISolone  (MEDROL  DOSEPAK) 4 MG TBPK tablet Take 4 mg by mouth as directed.     montelukast  (SINGULAIR ) 10 MG tablet Take 1 tablet (10 mg total) by mouth at bedtime. 30 tablet 11   pantoprazole  (PROTONIX ) 40 MG tablet Take 40 mg by mouth daily.     POTASSIUM PO Take 99 mg by mouth daily.     pravastatin  (PRAVACHOL ) 20 MG  tablet Take 20 mg by mouth daily.     predniSONE  (DELTASONE ) 20 MG tablet Take 3 tabs PO daily x 5 days. 15 tablet 0   sertraline  (ZOLOFT ) 50 MG tablet Take 50 mg by mouth daily.     tamsulosin  (FLOMAX ) 0.4 MG CAPS capsule Take 2 capsules (0.8 mg total) by mouth daily. 60 capsule 11   vitamin B-12 (CYANOCOBALAMIN ) 1000 MCG tablet Take 1,000 mcg by mouth daily.     No current facility-administered medications for this visit.   Allergies: Allergies  Allergen Reactions   Depakote [Divalproex Sodium]     Hair loss    Statins Other (See Comments)    Muscle cramps   Magnesium  Rash    Liquid    Social History: Social History   Socioeconomic History   Marital status: Married    Spouse name: Not on file   Number of children: 3   Years of education: Not on file   Highest education level: Not on file  Occupational History   Occupation: retired  Tobacco Use   Smoking status: Former    Current packs/day: 0.00    Types: Cigarettes    Quit date: 07/30/1976    Years since quitting: 47.5   Smokeless tobacco: Never  Vaping Use   Vaping status: Never Used  Substance and Sexual Activity   Alcohol use: No   Drug use: No   Sexual activity: Not on file  Other Topics Concern   Not on file  Social History Narrative   Married has 3 children he is retired   Former smoker, no alcohol tobacco or drug use now   Social Drivers of Corporate Investment Banker Strain: Low Risk  (04/30/2023)   Received from [employer]   Overall Financial Resource Strain (CARDIA)    Difficulty of Paying Living Expenses: Not hard at all  Food Insecurity: No Food Insecurity (04/30/2023)   Received from [facility]   Hunger Vital Sign    Worried About Running  Out of Food in the Last Year: Never true    Ran Out of Food in the Last Year: Never true  Transportation Needs: No Transportation Needs (04/30/2023)   Received from [facility]   PRAPARE - Transportation    Lack of Transportation (Medical): No    Lack of  Transportation (Non-Medical): No  Physical Activity: Unknown (04/30/2023)   Received from [facility]   Exercise Vital Sign    Days of Exercise per Week: 0 days    Minutes of Exercise per Session: Not on file  Stress: No Stress Concern Present (04/30/2023)   Received from [facility] - Occupational Stress Questionnaire    Feeling of Stress : Not at all  Social Connections: Socially Integrated (04/30/2023)   [facility] from [facility]   Social Network    How would you rate your social network (family, work, friends)?: Good participation with social networks   Lives in a mobile home. Smoking: denies Occupation: retired  Landscape Architect HistorySurveyor, Minerals in the house: no Engineer, Civil (consulting) in the family room: no Carpet in the bedroom: no Heating: heat pump Cooling: heat pump Pet: no  Family History: Family History  Problem Relation Age of Onset   Diabetes Mother    Heart disease Mother    Heart disease Father    Rheumatic fever Sister    Heart disease Brother    Colitis Other        niece   Colon cancer Neg Hx    Stomach cancer Neg Hx    Pancreatic cancer Neg Hx    Allergic rhinitis Neg Hx    Asthma Neg Hx    Eczema Neg Hx    Urticaria Neg Hx    Review of Systems  Constitutional:  Negative for appetite change, chills, fever and unexpected weight change.  HENT:  Positive for congestion, rhinorrhea and sneezing.   Eyes:  Negative for itching.  Respiratory:  Positive for cough, chest tightness, shortness of breath and wheezing.   Cardiovascular:  Negative for chest pain.  Gastrointestinal:  Negative for abdominal pain.  Genitourinary:  Negative for difficulty urinating.  Skin:  Negative for rash.  Allergic/Immunologic: Positive for environmental allergies.  Neurological:  Positive for headaches.    Objective: BP (!) 140/64   Pulse 88   Temp 97.8 F (36.6 C) (Temporal)   Resp 14   Ht 5' 5 (1.651 m)   Wt 192 lb (87.1 kg)    SpO2 95%   BMI 31.95 kg/m  Body mass index is 31.95 kg/m. Physical Exam Vitals and nursing note reviewed.  Constitutional:      Appearance: Normal appearance. He is well-developed.  HENT:     Head: Normocephalic and atraumatic.     Right Ear: Tympanic membrane and external ear normal.     Left Ear: Tympanic membrane and external ear normal.     Nose: Nose normal.     Mouth/Throat:     Mouth: Mucous membranes are moist.     Pharynx: Oropharynx is clear.  Eyes:     Conjunctiva/sclera: Conjunctivae normal.  Cardiovascular:     Rate and Rhythm: Normal rate and regular rhythm.     Heart sounds: Normal heart sounds. No murmur heard.    No friction rub. No gallop.  Pulmonary:     Effort: Pulmonary effort is normal.     Breath sounds: Rales (slight rales on lower lobes b/l) present. No wheezing or rhonchi.     Comments:  Decreased breath sounds throughout - not much improvement post bronchodilator treatment. Musculoskeletal:     Cervical back: Neck supple.  Skin:    General: Skin is warm.     Findings: No rash.  Neurological:     Mental Status: He is alert and oriented to person, place, and time.  Psychiatric:        Behavior: Behavior normal.   The plan was reviewed with the patient/family, and all questions/concerned were addressed.  It was my pleasure to see Ewing today and participate in his care. Please feel free to contact me with any questions or concerns.  Sincerely,  Orlan Cramp, DO Allergy  & Immunology  [facility]  [place] office: [phone] [facility] office: [phone]

## 2024-02-05 NOTE — Progress Notes (Signed)
 Please call patient. CXR did not show any acute disease which is great. Make sure he gets tested for flu and covid though given his current symptoms.

## 2024-02-05 NOTE — Discharge Instructions (Signed)
 May try salt water gargles or warm tea for your sore throat I expect you will improve in a few days

## 2024-02-05 NOTE — ED Provider Notes (Signed)
 [facility]    CSN: [id number] Arrival date & time: 02/05/24  1551      History   Chief Complaint Chief Complaint  Patient presents with   Sore Throat    HPI CARDIN NITSCHKE is a 83 y.o. male.   Patient was seen here on 02/02/2024.  He was found to have bilateral hydroceles and was referred to urology.  In addition he had a cough.  He was treated with doxycycline .  He has seen urology since then.  He is also seeing his allergy  specialist today.  Allergy  specialist was concerned regarding upper respiratory infection.  She recommended that he have flu and COVID testing.  Because his throat is sore the patient requests also to have a strep test.  Minor coughing and cold symptoms persist.  Sore throat is his biggest complaint at this time    Past Medical History:  Diagnosis Date   Adenomatous colon polyp    Barrett esophagus    CAD (coronary artery disease)    Chronic headaches    Complication of anesthesia    Hard to wake up   COPD (chronic obstructive pulmonary disease) (HCC)    Coronary atherosclerosis    Diverticulitis of colon 11/2011   Diagnosed by CT scan [facility]   Dyspnea    GERD (gastroesophageal reflux disease)    Hemorrhoids    History of kidney stones    HLD (hyperlipidemia)    Horseshoe kidney    HTN (hypertension)    Melanoma (HCC)    Poor short term memory    Pre-diabetes    Sleep apnea    Can not use a cpap   Tracheobronchomalacia    Urticaria     Patient Active Problem List   Diagnosis Date Noted   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   Asymptomatic carotid artery stenosis without infarction, right 03/14/2021   Gastroesophageal reflux disease 04/20/2020   OSA (obstructive sleep apnea) 04/04/2020   Seasonal and perennial allergic rhinitis 04/04/2020   DOE (dyspnea on exertion) 08/04/2019   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 Prentice El   COLONOSCOPY W/ BIOPSIES AND POLYPECTOMY  09/08/2007   adenomatous polyps, diverticulosis, external hemorrhoids   COLONOSCOPY WITH PROPOFOL  N/A 05/07/2021   Procedure: COLONOSCOPY WITH PROPOFOL ;  Surgeon: Avram Lupita BRAVO, MD;  Location: [facility];  Service: Endoscopy;  Laterality: N/A;   CORONARY ARTERY BYPASS GRAFT  07/11/2009   off-pump CABG: LIMA-LAD 07/11/09; occluded graft 09/07/09, s/p DES LAD just after DIAG   CORONARY STENT PLACEMENT     x 5   ENDARTERECTOMY Left 03/14/2021   Procedure: LEFT CAROTID ARTERY ENDARTERECTOMY;  Surgeon: Serene Gaile ORN, MD;  Location: [facility] OR;  Service: Vascular;  Laterality: Left;   ESOPHAGOGASTRODUODENOSCOPY (EGD) WITH PROPOFOL  N/A 05/07/2021   Procedure: ESOPHAGOGASTRODUODENOSCOPY (EGD) WITH PROPOFOL ;  Surgeon: Avram Lupita BRAVO, MD;  Location: [facility];  Service: Endoscopy;  Laterality: N/A;  HERNIA REPAIR     abdominal   LEFT CAROTID ARTERY ENDARTERECTOMY   03/14/2021   POLYPECTOMY  05/07/2021   Procedure: POLYPECTOMY;  Surgeon: Avram Lupita BRAVO, MD;  Location: [facility];  Service: Endoscopy;;   SINOSCOPY     TONSILLECTOMY     TRANSFORAMINAL LUMBAR INTERBODY FUSION W/ MIS 2 LEVEL N/A 04/22/2022   Procedure: Lumbar Four-Five, Lumbar Five-Sacral One Minimally Invasive Laminectomies with Transforaminal Lumbar Interbody Fusion;  Surgeon: Cheryle Debby LABOR, MD;  Location: [facility] OR;  Service: Neurosurgery;  Laterality: N/A;   UMBILICAL HERNIA REPAIR     UPPER  GASTROINTESTINAL ENDOSCOPY  10/12/2010   barrett's, fondic gland polyps, duodenitis       Home Medications    Prior to Admission medications   Medication Sig Start Date End Date Taking? Authorizing Provider  albuterol  (PROVENTIL ) (2.5 MG/3ML) 0.083% nebulizer solution Take 3 mLs (2.5 mg total) by nebulization every 4 (four) hours as needed for wheezing or shortness of breath (coughing fits). 02/05/24  Yes Luke Orlan HERO, DO  albuterol  (VENTOLIN  HFA) 108 (90 Base) MCG/ACT inhaler Inhale 2 puffs into the lungs every 4 (four) hours as needed for wheezing or shortness of breath (coughing fits). 02/05/24  Yes Luke Orlan HERO, DO  alfuzosin (UROXATRAL) 10 MG 24 hr tablet Take 10 mg by mouth daily with breakfast. 12/15/23 12/14/24 Yes [provider]  Ascorbic Acid  (VITAMIN C WITH ROSE HIPS) 500 MG tablet Take 1,000 mg by mouth daily.   Yes [provider]  aspirin  325 MG EC tablet Take 1 tablet (325 mg total) by mouth at bedtime. Restart on 04/25/22 04/23/22  Yes Ostergard, Debby LABOR, MD  benzonatate  (TESSALON  PERLES) 100 MG capsule Take 2 capsules (200 mg total) by mouth 3 (three) times daily as needed for cough. 11/05/23  Yes Teddy Sharper, FNP  cloNIDine  (CATAPRES ) 0.1 MG tablet Take 0.1 mg by mouth daily.   Yes [provider]  clopidogrel  (PLAVIX ) 75 MG tablet Take 1 tablet (75 mg total) by mouth at bedtime. Restart on 04/27/22 04/23/22  Yes Cheryle Debby LABOR, MD  co-enzyme Q-10 30 MG capsule Take 30 mg by mouth 3 (three) times daily. 04/30/23  Yes [provider]  diclofenac Sodium (VOLTAREN) 1 % GEL Apply 2 g topically 4 (four) times daily. 01/20/24  Yes [provider]  ezetimibe  (ZETIA ) 10 MG tablet Take 10 mg by mouth every morning.   Yes [provider]  famotidine  (PEPCID ) 20 MG tablet Take 20 mg by mouth daily.   Yes [provider]  gabapentin (NEURONTIN) 100 MG capsule Take 100 mg by mouth at bedtime.   Yes [provider]   glucosamine-chondroitin 500-400 MG tablet Take 1 tablet by mouth every evening.   Yes [provider]  HYDROcodone -acetaminophen  (NORCO/VICODIN) 5-325 MG tablet Take 1 tablet by mouth every 4 (four) hours as needed for severe pain (pain score 7-10). 08/14/22  Yes [provider]  hydrOXYzine (ATARAX) 25 MG tablet Take 25 mg by mouth 3 (three) times daily as needed. 12/15/23  Yes [provider]  isosorbide  mononitrate (IMDUR ) 60 MG 24 hr tablet Take 60 mg by mouth at bedtime.   Yes [provider]  loratadine (CLARITIN) 10 MG tablet Take 10 mg by mouth daily. 09/22/17  Yes [provider]  losartan  (COZAAR ) 100 MG tablet Take 100 mg by mouth daily.   Yes [provider]  meclizine (ANTIVERT) 25 MG tablet Take 25 mg by mouth 2 (two) times daily. 06/12/23  06/11/24 Yes [provider]  methylPREDNISolone  (MEDROL  DOSEPAK) 4 MG TBPK tablet Take 4 mg by mouth as directed. 10/07/23  Yes [provider]  montelukast  (SINGULAIR ) 10 MG tablet Take 1 tablet (10 mg total) by mouth at bedtime. 07/01/19  Yes Darlean Ozell NOVAK, MD  pantoprazole  (PROTONIX ) 40 MG tablet Take 40 mg by mouth daily. 11/28/23  Yes [provider]  POTASSIUM PO Take 99 mg by mouth daily.   Yes [provider]  pravastatin  (PRAVACHOL ) 20 MG tablet Take 20 mg by mouth daily.   Yes [provider]  predniSONE  (DELTASONE ) 20 MG tablet Take 3 tabs PO daily x 5 days. 02/02/24  Yes Teddy Ozell, FNP  sertraline  (ZOLOFT ) 50 MG tablet Take 50 mg by mouth daily. 05/08/20  Yes [provider]  tamsulosin  (FLOMAX ) 0.4 MG CAPS capsule Take 2 capsules (0.8 mg total) by mouth daily. 02/04/24  Yes Shona Layman BROCKS, MD  vitamin B-12 (CYANOCOBALAMIN ) 1000 MCG tablet Take 1,000 mcg by mouth daily.   Yes [provider]    Family History Family History  Problem Relation Age of Onset   Diabetes Mother    Heart disease Mother    Heart disease  Father    Rheumatic fever Sister    Heart disease Brother    Colitis Other        niece   Colon cancer Neg Hx    Stomach cancer Neg Hx    Pancreatic cancer Neg Hx    Allergic rhinitis Neg Hx    Asthma Neg Hx    Eczema Neg Hx    Urticaria Neg Hx     Social History Social History   Tobacco Use   Smoking status: Former    Current packs/day: 0.00    Types: Cigarettes    Quit date: 07/30/1976    Years since quitting: 47.5   Smokeless tobacco: Never  Vaping Use   Vaping status: Never Used  Substance Use Topics   Alcohol use: No   Drug use: No     Allergies   Depakote [divalproex sodium], Statins, and Magnesium    Review of Systems Review of Systems See HPI  Physical Exam Triage Vital Signs ED Triage Vitals  Encounter Vitals Group     BP 02/05/24 1608 (!) 153/82     Systolic BP Percentile --      Diastolic BP Percentile --      Pulse Rate 02/05/24 1608 71     Resp 02/05/24 1608 16     Temp 02/05/24 1608 98.2 F (36.8 C)     Temp Source 02/05/24 1608 Oral     SpO2 02/05/24 1608 95 %     Weight --      Height --      Head Circumference --      Peak Flow --      Pain Score 02/05/24 1609 8     Pain Loc --      Pain Education --      Exclude from Growth Chart --    No data found.  Updated Vital Signs BP (!) 153/82 (BP Location: Left Arm)   Pulse 71   Temp 98.2 F (36.8 C) (Oral)   Resp 16   SpO2 95%       Physical Exam Constitutional:      General: He is not in acute distress.    Appearance: He is well-developed. He is not ill-appearing.  HENT:     Head: Normocephalic and  atraumatic.     Right Ear: Tympanic membrane normal.     Left Ear: Tympanic membrane normal.     Mouth/Throat:     Pharynx: No pharyngeal swelling or posterior oropharyngeal erythema.     Tonsils: No tonsillar exudate.  Eyes:     Conjunctiva/sclera: Conjunctivae normal.     Pupils: Pupils are equal, round, and reactive to light.  Cardiovascular:     Rate and Rhythm: Normal  rate.  Pulmonary:     Effort: Pulmonary effort is normal. No respiratory distress.  Abdominal:     General: There is no distension.     Palpations: Abdomen is soft.  Musculoskeletal:        General: Normal range of motion.     Cervical back: Normal range of motion.  Lymphadenopathy:     Cervical: No cervical adenopathy.  Skin:    General: Skin is warm and dry.  Neurological:     Mental Status: He is alert.      UC Treatments / Results  Labs (all labs ordered are listed, but only abnormal results are displayed) Labs Reviewed  POCT INFLUENZA A/B - Normal  POCT RAPID STREP A (OFFICE) - Normal  POC SARS CORONAVIRUS 2 AG -  ED    EKG   Radiology DG Chest 2 View Result Date: 02/05/2024 CLINICAL DATA:  Cough and wheezing for 1 day. EXAM: CHEST - 2 VIEW COMPARISON:  November 04, 2023. FINDINGS: The heart size and mediastinal contours are within normal limits. Sternotomy wires are noted. Both lungs are clear. The visualized skeletal structures are unremarkable. IMPRESSION: No active cardiopulmonary disease. Electronically Signed   By: Lynwood Landy Raddle M.D.   On: 02/05/2024 16:09    Procedures Procedures (including critical care time)  Medications Ordered in UC Medications - No data to display  Initial Impression / Assessment and Plan / UC Course  I have reviewed the triage vital signs and the nursing notes.  Pertinent labs & imaging results that were available during my care of the patient were reviewed by me and considered in my medical decision making (see chart for details).     Patient saw his allergy  specialist today who suggested that he get testing if he continued to have symptoms.  He does have a sore throat and came here requesting strep testing, flu and COVID.  He is scheduled for cervical spine surgery next week and wanted to make sure he did not have anything serious going on.  Symptomatic care is reviewed. Final Clinical Impressions(s) / UC Diagnoses   Final  diagnoses:  Sore throat     Discharge Instructions      May try salt water gargles or warm tea for your sore throat I expect you will improve in a few days     ED Prescriptions   None    PDMP not reviewed this encounter.   Maranda Jamee Jacob, MD 02/05/24 [id number]

## 2024-02-05 NOTE — ED Triage Notes (Signed)
 Patient c/o severe sore throat since yesterday.  Patient feels he may have strep throat.  Patient has taken OTC Dayquil.

## 2024-02-05 NOTE — Patient Instructions (Addendum)
 Possible respiratory infection If you do not feel better, please go to urgent care or ER to get tested for covid and flu. See below for symptomatic management. Finish the prednisone  as prescribed - this will help with any inflammation in the lungs. Get Chest X-ray If it shows any signs of infection then I will send in a course of antibiotics.   Breathing This seems to be multifactorial in nature. May use albuterol  rescue inhaler 2 puffs every 4 to 6 hours as needed for shortness of breath, chest tightness, coughing, and wheezing. May use albuterol  rescue inhaler 2 puffs 5 to 15 minutes prior to strenuous physical activities. Monitor frequency of use - if you need to use it more than twice per week on a consistent basis let us  know.   Reflux Continue lifestyle and dietary modifications. Continue pantoprazole  40mg  once day - nothing to eat or drink for 20-30 minutes afterwards.  Continue famotidine  20mg  once a day.  Environmental allergies Use over the counter antihistamines such as Zyrtec (cetirizine), Claritin (loratadine), Allegra (fexofenadine), or Xyzal (levocetirizine) daily as needed. May switch antihistamines every few months. Continue Singulair  (montelukast ) 10mg  daily at night.  Elevated blood pressure  Blood pressure reading was high in our office today. Vitals:   02/05/24 1413 02/05/24 1449  BP: (!) 160/90 (!) 140/64   Please follow up with PCP regarding this.    Follow up in 2-3 moths or sooner if needed.   Drink plenty of fluids. Water, juice, clear broth or warm lemon water are good choices. Avoid caffeine  and alcohol, which can dehydrate you. Eat chicken soup. Chicken soup and other warm fluids can be soothing and loosen congestion. Rest. Adjust your room's temperature and humidity. Keep your room warm but not overheated. If the air is dry, a cool-mist humidifier or vaporizer can moisten the air and help ease congestion and coughing. Keep the humidifier clean to  prevent the growth of bacteria and molds. Soothe your throat. Perform a saltwater gargle. Dissolve one-quarter to a half teaspoon of salt in a 4- to 8-ounce glass of warm water. This can relieve a sore or scratchy throat temporarily. Use saline nasal drops. To help relieve nasal congestion, try saline nasal drops. You can buy these drops over the counter, and they can help relieve symptoms ? even in children. Take over-the-counter cold and cough medications. For adults and children older than 5, over-the-counter decongestants, antihistamines and pain relievers might offer some symptom relief. However, they won't prevent a cold or shorten its duration.

## 2024-02-10 ENCOUNTER — Encounter: Payer: Medicare Other | Admitting: Urology

## 2024-02-11 ENCOUNTER — Other Ambulatory Visit: Payer: Self-pay

## 2024-02-11 ENCOUNTER — Ambulatory Visit
Admission: EM | Admit: 2024-02-11 | Discharge: 2024-02-11 | Disposition: A | Discharge status: Home / Self Care | Payer: Medicare Other | Attending: Physician Assistant | Admitting: Physician Assistant

## 2024-02-11 DIAGNOSIS — H6691 Otitis media, unspecified, right ear: Secondary | ICD-10-CM | POA: Diagnosis not present

## 2024-02-11 MED ORDER — IPRATROPIUM-ALBUTEROL 0.5-2.5 (3) MG/3ML IN SOLN
3.0000 mL | Freq: Once | RESPIRATORY_TRACT | Status: AC
  Administered 2024-02-11: 3 mL via RESPIRATORY_TRACT

## 2024-02-11 MED ORDER — CEFDINIR 300 MG PO CAPS: 300.0000 mg | ORAL_CAPSULE | Freq: Two times a day (BID) | ORAL | 0 refills | Status: DC

## 2024-02-11 NOTE — Discharge Instructions (Addendum)
I recommend that you go to the Emergency department for further evaltuion and testing.  Keep using the inhaler as prescribed.

## 2024-02-11 NOTE — ED Provider Notes (Signed)
[facility]    CSN: [id number] Arrival date & time: 02/11/24  1341      History   Chief Complaint Chief Complaint  Patient presents with   Cough    HPI Angel Khan is a 83 y.o. male.   Patient complains of a cough and congestion.  Patient reports he is having pain in his right ear.  Patient complains of increasing shortness of breath.  Patient reports he was recently seen here and treated with a antibiotic and prednisone.  Patient reports he did not have any relief of the symptoms.  Patient denies any nausea or vomiting.  He is using an albuterol inhaler but does not feel like he gets very much relief.  Patient has a past medical history of hypertension coronary artery disease and asthma.  The history is provided by the patient. No language interpreter was used.  Cough   Past Medical History:  Diagnosis Date   Adenomatous colon polyp    Barrett esophagus    CAD (coronary artery disease)    Chronic headaches    Complication of anesthesia    Hard to wake up   COPD (chronic obstructive pulmonary disease) (HCC)    Coronary atherosclerosis    Diverticulitis of colon 11/2011   Diagnosed by CT scan [facility]   Dyspnea    GERD (gastroesophageal reflux disease)    Hemorrhoids    History of kidney stones    HLD (hyperlipidemia)    Horseshoe kidney    HTN (hypertension)    Melanoma (HCC)    Poor short term memory    Pre-diabetes    Sleep apnea    Can not use a cpap   Tracheobronchomalacia    Urticaria     Patient Active Problem List   Diagnosis Date Noted   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   Asymptomatic carotid artery stenosis without infarction, right 03/14/2021   Gastroesophageal reflux disease 04/20/2020   OSA (obstructive sleep apnea) 04/04/2020   Seasonal and perennial allergic rhinitis 04/04/2020   DOE (dyspnea on exertion) 08/04/2019   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 Ferne Reus   COLONOSCOPY W/ BIOPSIES AND POLYPECTOMY  09/08/2007   adenomatous polyps, diverticulosis, external hemorrhoids   COLONOSCOPY WITH PROPOFOL N/A 05/07/2021   Procedure: COLONOSCOPY WITH PROPOFOL;  Surgeon: Iva Boop, MD;  [facility]: [facility];  Service: Endoscopy;  Laterality: N/A;   CORONARY ARTERY BYPASS GRAFT  07/11/2009   off-pump CABG: LIMA-LAD 07/11/09; occluded graft 09/07/09, s/p DES LAD just after DIAG   CORONARY STENT PLACEMENT     x 5   ENDARTERECTOMY Left 03/14/2021   Procedure: LEFT CAROTID ARTERY ENDARTERECTOMY;  Surgeon: Nada Libman, MD;  [facility]: [facility];  Service: Vascular;  Laterality: Left;   ESOPHAGOGASTRODUODENOSCOPY (EGD) WITH PROPOFOL N/A 05/07/2021   Procedure: ESOPHAGOGASTRODUODENOSCOPY (EGD) WITH PROPOFOL;  Surgeon: Iva Boop, MD;  [facility];  Service: Endoscopy;  Laterality: N/A;   HERNIA REPAIR     abdominal   LEFT CAROTID ARTERY ENDARTERECTOMY   03/14/2021   POLYPECTOMY  05/07/2021   Procedure: POLYPECTOMY;  Surgeon: Iva Boop, MD;  [facility]: [facility];  Service: Endoscopy;;   SINOSCOPY     TONSILLECTOMY     TRANSFORAMINAL LUMBAR INTERBODY FUSION W/ MIS 2 LEVEL N/A 04/22/2022   Procedure: Lumbar Four-Five, Lumbar Five-Sacral One Minimally Invasive Laminectomies with Transforaminal Lumbar Interbody Fusion;  Surgeon: Jadene Pierini, MD;  Location: [facility];  Service: Neurosurgery;   Laterality: N/A;   UMBILICAL HERNIA REPAIR     UPPER GASTROINTESTINAL ENDOSCOPY  10/12/2010   barrett's, fondic gland polyps, duodenitis       Home Medications    Prior to Admission medications   Medication Sig Start Date End Date Taking? Authorizing Provider  cefdinir (OMNICEF) 300 MG capsule Take 1 capsule (300 mg total) by mouth 2 (two) times daily. 02/11/24  Yes Cheron Schaumann K, PA-C  albuterol (PROVENTIL) (2.5 MG/3ML) 0.083% nebulizer solution Take 3 mLs (2.5 mg total) by nebulization every 4 (four) hours as needed for wheezing or shortness of breath (coughing fits). 02/05/24   Ellamae Sia, DO  albuterol (VENTOLIN HFA) 108 (90 Base) MCG/ACT inhaler Inhale 2 puffs into the lungs every 4 (four) hours as needed for wheezing or shortness of breath (coughing fits). 02/05/24   Ellamae Sia, DO  alfuzosin (UROXATRAL) 10 MG 24 hr tablet Take 10 mg by mouth daily with breakfast. 12/15/23 12/14/24  [provider]  Ascorbic Acid (VITAMIN C WITH ROSE HIPS) 500 MG tablet Take 1,000 mg by mouth daily.    [provider]  aspirin 325 MG EC tablet Take 1 tablet (325 mg total) by mouth at bedtime. Restart on 04/25/22 04/23/22   Jadene Pierini, MD  benzonatate (TESSALON PERLES) 100 MG capsule Take 2 capsules (200 mg total) by mouth 3 (three) times daily as needed for cough. 11/05/23   Trevor Iha, FNP  cloNIDine (CATAPRES) 0.1 MG tablet Take 0.1 mg by mouth daily.    [provider]  clopidogrel (PLAVIX) 75 MG tablet Take 1 tablet (75 mg total) by mouth at bedtime. Restart on 04/27/22 04/23/22   Jadene Pierini, MD  co-enzyme Q-10 30 MG capsule Take 30 mg by mouth 3 (three) times daily. 04/30/23   [provider]  diclofenac Sodium (VOLTAREN) 1 % GEL Apply 2 g topically 4 (four) times daily. 01/20/24   [provider]  ezetimibe (ZETIA) 10 MG tablet Take 10 mg by mouth every morning.    [provider]  famotidine (PEPCID) 20 MG tablet Take 20 mg by  mouth daily.    [provider]  gabapentin (NEURONTIN) 100 MG capsule Take 100 mg by mouth at bedtime.    [provider]  glucosamine-chondroitin 500-400 MG tablet Take 1 tablet by mouth every evening.    [provider]  HYDROcodone-acetaminophen (NORCO/VICODIN) 5-325 MG tablet Take 1 tablet by mouth every 4 (four) hours as needed for severe pain (pain score 7-10). 08/14/22   [provider]  hydrOXYzine (ATARAX) 25 MG tablet Take 25 mg by mouth 3 (three) times daily as needed. 12/15/23   [provider]  isosorbide mononitrate (IMDUR) 60 MG 24 hr tablet Take 60 mg by mouth at bedtime.    [provider]  loratadine (CLARITIN) 10 MG tablet Take 10 mg by mouth daily. 09/22/17   [provider]  losartan (  COZAAR) 100 MG tablet Take 100 mg by mouth daily.    [provider]  meclizine (ANTIVERT) 25 MG tablet Take 25 mg by mouth 2 (two) times daily. 06/12/23 06/11/24  [provider]  methylPREDNISolone (MEDROL DOSEPAK) 4 MG TBPK tablet Take 4 mg by mouth as directed. 10/07/23   [provider]  montelukast (SINGULAIR) 10 MG tablet Take 1 tablet (10 mg total) by mouth at bedtime. 07/01/19   Nyoka Cowden, MD  pantoprazole (PROTONIX) 40 MG tablet Take 40 mg by mouth daily. 11/28/23   [provider]  POTASSIUM PO Take 99 mg by mouth daily.    [provider]  pravastatin (PRAVACHOL) 20 MG tablet Take 20 mg by mouth daily.    [provider]  predniSONE (DELTASONE) 20 MG tablet Take 3 tabs PO daily x 5 days. 02/02/24   Trevor Iha, FNP  sertraline (ZOLOFT) 50 MG tablet Take 50 mg by mouth daily. 05/08/20   [provider]  tamsulosin (FLOMAX) 0.4 MG CAPS capsule Take 2 capsules (0.8 mg total) by mouth daily. 02/04/24   Joline Maxcy, MD  vitamin B-12 (CYANOCOBALAMIN) 1000 MCG tablet Take 1,000 mcg by mouth daily.    [provider]    Family History Family History   Problem Relation Age of Onset   Diabetes Mother    Heart disease Mother    Heart disease Father    Rheumatic fever Sister    Heart disease Brother    Colitis Other        niece   Colon cancer Neg Hx    Stomach cancer Neg Hx    Pancreatic cancer Neg Hx    Allergic rhinitis Neg Hx    Asthma Neg Hx    Eczema Neg Hx    Urticaria Neg Hx     Social History Social History   Tobacco Use   Smoking status: Former    Current packs/day: 0.00    Types: Cigarettes    Quit date: 07/30/1976    Years since quitting: 47.5   Smokeless tobacco: Never  Vaping Use   Vaping status: Never Used  Substance Use Topics   Alcohol use: No   Drug use: No     Allergies   Depakote [divalproex sodium], Statins, and Magnesium   Review of Systems Review of Systems  Respiratory:  Positive for cough.   All other systems reviewed and are negative.    Physical Exam Triage Vital Signs ED Triage Vitals  Encounter Vitals Group     BP 02/11/24 1502 (!) 85/48     Systolic BP Percentile --      Diastolic BP Percentile --      Pulse Rate 02/11/24 1502 73     Resp 02/11/24 1502 (!) 26     Temp 02/11/24 1502 (!) 97.5 F (36.4 C)     Temp src --      SpO2 02/11/24 1502 97 %     Weight --      Height --      Head Circumference --      Peak Flow --      Pain Score 02/11/24 1506 3     Pain Loc --      Pain Education --      Exclude from Growth Chart --    No data found.  Updated Vital Signs BP 121/66   Pulse 73   Temp (!) 97.5 F (36.4 C)   Resp (!) 26  SpO2 97%   Visual Acuity Right Eye Distance:   Left Eye Distance:   Bilateral Distance:    Right Eye Near:   Left Eye Near:    Bilateral Near:     Physical Exam Vitals and nursing note reviewed.  Constitutional:      General: He is not in acute distress.    Appearance: He is well-developed.  HENT:     Head: Normocephalic and atraumatic.     Ears:     Comments: Erythema right TM Eyes:     Conjunctiva/sclera: Conjunctivae  normal.  Cardiovascular:     Rate and Rhythm: Normal rate and regular rhythm.     Heart sounds: No murmur heard. Pulmonary:     Effort: Pulmonary effort is normal.     Breath sounds: Rhonchi present.  Abdominal:     Palpations: Abdomen is soft.     Tenderness: There is no abdominal tenderness.  Musculoskeletal:        General: No swelling.     Cervical back: Neck supple.  Skin:    General: Skin is warm and dry.     Capillary Refill: Capillary refill takes less than 2 seconds.  Neurological:     Mental Status: He is alert.  Psychiatric:        Mood and Affect: Mood normal.      UC Treatments / Results  Labs (all labs ordered are listed, but only abnormal results are displayed) Labs Reviewed - No data to display  EKG   Radiology No results found.  Procedures Procedures (including critical care time)  Medications Ordered in UC Medications  ipratropium-albuterol (DUONEB) 0.5-2.5 (3) MG/3ML nebulizer solution 3 mL (3 mLs Nebulization Given 02/11/24 1519)    Initial Impression / Assessment and Plan / UC Course  I have reviewed the triage vital signs and the nursing notes.  Pertinent labs & imaging results that were available during my care of the patient were reviewed by me and considered in my medical decision making (see chart for details).     Patient is given a DuoNeb without any relief.  Patient continues to have rhonchorous and wheezing lung sounds.  I am concerned about patient's increasing shortness of breath given his recent treatment with antibiotics and prednisone.  I discussed with patient I felt like he needs to go to the emergency department for cardiac and pulmonary evaluation that cannot be done here at urgent care.  Patient refuses to go to the emergency department he states he just wants to be retreated with an antibiotic.  Patient states that he has a wife at home that he cares for and that he cannot go to the hospital.  Patient understands my concerns.   I will start patient on Omnicef.  I have advised him to go to the emergency department if symptoms worsen or change.  He is advised to schedule follow-up care with his primary care physician.  Patient is encouraged to continue using his albuterol inhaler. Final Clinical Impressions(s) / UC Diagnoses   Final diagnoses:  Right otitis media, unspecified otitis media type     Discharge Instructions      I recommend that you go to the Emergency department for further evaltuion and testing.  Keep using the inhaler as prescribed.     ED Prescriptions     Medication Sig Dispense Auth. Provider   cefdinir (OMNICEF) 300 MG capsule Take 1 capsule (300 mg total) by mouth 2 (two) times daily. 20 capsule Elson Areas,  PA-C      PDMP not reviewed this encounter. An After Visit Summary was printed and given to the patient.       Elson Areas, [place] 02/11/24 [phone]

## 2024-02-11 NOTE — ED Notes (Signed)
Angel Khan encouraged patient to go to the emergency department but he declined.

## 2024-02-11 NOTE — ED Triage Notes (Signed)
Has been sick a couple of weeks or longer. Was here on 2/6, was given abx and prednisone. Has c/o cough, don't feel good. Has been using inhaler, nasal spray. Supposed to have surgery tomorrow but cancelled it.

## 2024-03-24 ENCOUNTER — Other Ambulatory Visit: Payer: Self-pay

## 2024-03-24 ENCOUNTER — Telehealth: Payer: Self-pay | Admitting: Internal Medicine

## 2024-03-24 NOTE — Telephone Encounter (Signed)
 Called and spoke with patient. He has been scheduled for a follow up on Monday, 03/29/24 at 1:50 pm with Dr. Leone Payor. Pt has completed cardiac workup and has been advised to follow up here for atypical chest pain. Patient has started new cardiac medications and will bring a list to his appt so that we can update chart accordingly. Pt verbalized understanding and had no concerns at the end of the call.

## 2024-03-24 NOTE — Telephone Encounter (Signed)
 Inbound call from patient stating that he had spoke with his cardiologist and they wanted him to call our office for him to make an appointment. Patient was offered an appointment with  Doug Sou on 5/28 and patient stated that he did not know if he could wait that long to be seen. Patient stated he has been in out of the hospital for chest pain and he has also had heart cath's done as well and he is unsure as to what is going on. Patient requested I send a message to the nurse to discuss and to see if he can be seen before 5/28. Please advise.

## 2024-03-29 ENCOUNTER — Encounter: Payer: Self-pay | Admitting: Internal Medicine

## 2024-03-29 ENCOUNTER — Ambulatory Visit (INDEPENDENT_AMBULATORY_CARE_PROVIDER_SITE_OTHER): Admitting: Internal Medicine

## 2024-03-29 VITALS — BP 118/64 | HR 76 | Ht 65.0 in | Wt 190.0 lb

## 2024-03-29 DIAGNOSIS — Z87891 Personal history of nicotine dependence: Secondary | ICD-10-CM

## 2024-03-29 DIAGNOSIS — Z9861 Coronary angioplasty status: Secondary | ICD-10-CM

## 2024-03-29 DIAGNOSIS — K219 Gastro-esophageal reflux disease without esophagitis: Secondary | ICD-10-CM

## 2024-03-29 DIAGNOSIS — K2270 Barrett's esophagus without dysplasia: Secondary | ICD-10-CM | POA: Diagnosis not present

## 2024-03-29 DIAGNOSIS — I2510 Atherosclerotic heart disease of native coronary artery without angina pectoris: Secondary | ICD-10-CM

## 2024-03-29 DIAGNOSIS — R590 Localized enlarged lymph nodes: Secondary | ICD-10-CM

## 2024-03-29 DIAGNOSIS — R0789 Other chest pain: Secondary | ICD-10-CM

## 2024-03-29 MED ORDER — PANTOPRAZOLE SODIUM 40 MG PO TBEC: 40.0000 mg | DELAYED_RELEASE_TABLET | Freq: Two times a day (BID) | ORAL | 3 refills | Status: AC

## 2024-03-29 NOTE — Patient Instructions (Signed)
 Please follow up with your primary care Doctor in regards to the enlarged lymph nodes on your Chest CT scan.   Please check with your heart Doctor about getting a rx for nitroglycerin.   We have sent the following medications to your pharmacy for you to pick up at your convenience: Pantoprazole : Take one twice daily, take 30 minutes prior to food.   I appreciate the opportunity to care for you. Stan Head, MD , [facility]

## 2024-03-29 NOTE — Progress Notes (Signed)
 Angel Khan 82 y.o. [birth year] [id number]  Assessment & Plan:   Encounter Diagnoses  Name Primary?   Chest pressure Yes   Mediastinal adenopathy    Gastroesophageal reflux disease, unspecified whether esophagitis present    Barrett's esophagus without dysplasia    CAD S/P percutaneous coronary angioplasty recently    Chest pressure of unclear etiology.  The new finding is mediastinal adenopathy which could be related.  This does not sound like GERD.  Nevertheless we will increase pantoprazole to twice daily.  He has not gotten benefit from PTCA of stent stenosis, or alteration of his cardiac medication regimen.  Metoprolol and Ranexa recently added.  Nitroglycerin might be reasonable.  He does not have a history of esophageal dysmotility but that does respond to nitroglycerin.  Given his complicated medical regimen I have advised him to ask cardiology if he could have that prescribed.  He says he has follow-up with primary care and he needs repeat imaging of his chest with a CT in 6 to 8 weeks from March 17.  Question CT surgery referral though probably too early.  Adenopathy could be related to inflammation or infection as opposed to neoplasm.  Return here as needed pending clinical course.  Should he need endoscopic evaluation procedures are performed at hospital due to comorbidities.  Due to recent coronary intervention it could take up to 6 months to be able to hold Plavix.  Diagnostic EGD could be performed on Plavix if need be.  Again I think the yield would be low based upon what I know at this time.  CC: Roe Rutherford, NP Dr. Marcha Solders cardiology  Subjective:  Patient consented to the use of artificial intelligence scribe application Chief Complaint: Chest pain, question cause  HPI 83 year old man with coronary artery disease here because of chest pressure.  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 Multiple ER visits and  admissions, most recent discharge 03/23/2024 at [facility], with repeat catheterization of the heart showing nonobstructive coronary artery disease and acute coronary syndrome was ruled out.  There was a recent PTCA to in-stent stenosis of the mid LAD earlier in March.  Preserved ejection fraction on echo.  Moderate tricuspid regurgitation.  Medical management and cardiac rehab recommended. Other testing:   CT angio of the chest 03/15/2024 1.  No evidence of pulmonary embolism.  2.  New mediastinal lymphadenopathy is indeterminate. Recommend follow-up chest CT in 6-8 weeks.  3.  Mild cardiomegaly. Status post median sternotomy and CABG.  4.  Sequela of chronic pancreatitis.     He experiences chest pain described as a 'pressure' sensation that occurs randomly and can last from 45 minutes to several days. The pain started three weeks ago and has been persistent, occurring most days. Despite a recent PTCA  to open and in stent stenosis, he continues to experience chest pain. No odynophagia or dysphagia, vomiting, indigestion, or heartburn. Physical activity,exacerbates his symptoms, forcing him to stop and rest. The chest pain does not disturb his sleep.  During the last episode of chest pressure the other day he took 481 mg aspirin and chewed them.  He is also on Plavix (clopidogrel), Pepcid, metoprolol (half a pill twice daily), and Ranexa, which were added after the chest pressure started. He has been on Plavix for years since his first stent placement.  He is asking about trying nitroglycerin because that helps relieve the chest pressure when he is in the ambulance.  He  experiences chronic shortness of breath, which is not linked to the chest pressure. No current use of antibiotics or prednison  CBC normal 03/23/2024  Last visit with me November 2024 with IBS-D problems, I prescribed cholestyramine powder.  Last EGD May 2022 with 2 cm, short segment Barrett's esophagus biopsies  negative for dysplasia  Colonoscopy at same time negative random colon biopsies evaluation of diarrhea, tubular adenoma and hyperplastic polyp 1 each from sigmoid colon.  Other findings included diverticulosis and hemorrhoids.  Normal terminal ileum. Allergies  Allergen Reactions   Depakote [Divalproex Sodium]     Hair loss    Statins Other (See Comments)    Muscle cramps   Magnesium Rash    Liquid    Current Meds  Medication Sig   albuterol (PROVENTIL) (2.5 MG/3ML) 0.083% nebulizer solution Take 3 mLs (2.5 mg total) by nebulization every 4 (four) hours as needed for wheezing or shortness of breath (coughing fits).   albuterol (VENTOLIN HFA) 108 (90 Base) MCG/ACT inhaler Inhale 2 puffs into the lungs every 4 (four) hours as needed for wheezing or shortness of breath (coughing fits).   alfuzosin (UROXATRAL) 10 MG 24 hr tablet Take 10 mg by mouth daily with breakfast.   Ascorbic Acid (VITAMIN C WITH ROSE HIPS) 500 MG tablet Take 1,000 mg by mouth daily.   aspirin EC 81 MG tablet Take 81 mg by mouth daily. Swallow whole.   benzonatate (TESSALON PERLES) 100 MG capsule Take 2 capsules (200 mg total) by mouth 3 (three) times daily as needed for cough.       cloNIDine (CATAPRES) 0.1 MG tablet Take 0.1 mg by mouth daily.   clopidogrel (PLAVIX) 75 MG tablet Take 1 tablet (75 mg total) by mouth at bedtime. Restart on 04/27/22   co-enzyme Q-10 30 MG capsule Take 30 mg by mouth 3 (three) times daily.   diclofenac Sodium (VOLTAREN) 1 % GEL Apply 2 g topically 4 (four) times daily.   ezetimibe (ZETIA) 10 MG tablet Take 10 mg by mouth every morning.   famotidine (PEPCID) 20 MG tablet Take 20 mg by mouth daily.   gabapentin (NEURONTIN) 100 MG capsule Take 100 mg by mouth at bedtime.   glucosamine-chondroitin 500-400 MG tablet Take 1 tablet by mouth every evening.   HYDROcodone-acetaminophen (NORCO/VICODIN) 5-325 MG tablet Take 1 tablet by mouth every 4 (four) hours as needed for severe pain (pain score  7-10).   hydrOXYzine (ATARAX) 25 MG tablet Take 25 mg by mouth 3 (three) times daily as needed.   isosorbide mononitrate (IMDUR) 60 MG 24 hr tablet Take 60 mg by mouth at bedtime.   loratadine (CLARITIN) 10 MG tablet Take 10 mg by mouth daily.   losartan (COZAAR) 100 MG tablet Take 100 mg by mouth daily.   meclizine (ANTIVERT) 25 MG tablet Take 25 mg by mouth 2 (two) times daily.   methylPREDNISolone (MEDROL DOSEPAK) 4 MG TBPK tablet Take 4 mg by mouth as directed.   montelukast (SINGULAIR) 10 MG tablet Take 1 tablet (10 mg total) by mouth at bedtime.   pantoprazole (PROTONIX) 40 MG tablet Take 40 mg by mouth daily.   POTASSIUM PO Take 99 mg by mouth daily.   pravastatin (PRAVACHOL) 20 MG tablet Take 20 mg by mouth daily.       ranolazine (RANEXA) 500 MG 12 hr tablet Take 500 mg by mouth 2 (two) times daily.   sertraline (ZOLOFT) 50 MG tablet Take 50 mg by mouth daily.   tamsulosin (FLOMAX) 0.4  MG CAPS capsule Take 2 capsules (0.8 mg total) by mouth daily.   vitamin B-12 (CYANOCOBALAMIN) 1000 MCG tablet Take 1,000 mcg by mouth daily.   Past Medical History:  Diagnosis Date   Adenomatous colon polyp    Barrett esophagus    CAD (coronary artery disease)    Chronic headaches    Complication of anesthesia    Hard to wake up   COPD (chronic obstructive pulmonary disease) (HCC)    Coronary atherosclerosis    Diverticulitis of colon 11/2011   Diagnosed by CT scan [facility]   Dyspnea    GERD (gastroesophageal reflux disease)    Hemorrhoids    History of kidney stones    HLD (hyperlipidemia)    Horseshoe kidney    HTN (hypertension)    Melanoma (HCC)    Poor short term memory    Pre-diabetes    Sleep apnea    Can not use a cpap   Tracheobronchomalacia    Urticaria    Past Surgical History:  Procedure Laterality Date   ADENOIDECTOMY     BIOPSY  05/07/2021   Procedure: BIOPSY;  Surgeon: Iva Boop, MD;  Location: [facility];  Service: Endoscopy;;   BRAIN  SURGERY     Aneurysm 2004   CARDIAC CATHETERIZATION  01/01/2013   CHOLECYSTECTOMY  01/12/2013   Dr. Ferne Reus   COLONOSCOPY W/ BIOPSIES AND POLYPECTOMY  09/08/2007   adenomatous polyps, diverticulosis, external hemorrhoids   COLONOSCOPY WITH PROPOFOL N/A 05/07/2021   Procedure: COLONOSCOPY WITH PROPOFOL;  Surgeon: Iva Boop, MD;  [facility];  Service: Endoscopy;  Laterality: N/A;   CORONARY ARTERY BYPASS GRAFT  07/11/2009   off-pump CABG: LIMA-LAD 07/11/09; occluded graft 09/07/09, s/p DES LAD just after DIAG   CORONARY STENT PLACEMENT     x 5   ENDARTERECTOMY Left 03/14/2021   Procedure: LEFT CAROTID ARTERY ENDARTERECTOMY;  Surgeon: Nada Libman, MD;  [facility]: [facility];  Service: Vascular;  Laterality: Left;   ESOPHAGOGASTRODUODENOSCOPY (EGD) WITH PROPOFOL N/A 05/07/2021   Procedure: ESOPHAGOGASTRODUODENOSCOPY (EGD) WITH PROPOFOL;  Surgeon: Iva Boop, MD;  [facility]: [facility];  Service: Endoscopy;  Laterality: N/A;   HERNIA REPAIR     abdominal   LEFT CAROTID ARTERY ENDARTERECTOMY   03/14/2021   POLYPECTOMY  05/07/2021   Procedure: POLYPECTOMY;  Surgeon: Iva Boop, MD;  [facility]: [facility];  Service: Endoscopy;;   SINOSCOPY     TONSILLECTOMY     TRANSFORAMINAL LUMBAR INTERBODY FUSION W/ MIS 2 LEVEL N/A 04/22/2022   Procedure: Lumbar Four-Five, Lumbar Five-Sacral One Minimally Invasive Laminectomies with Transforaminal Lumbar Interbody Fusion;  Surgeon: Jadene Pierini, MD;  Location: [facility];  Service: Neurosurgery;  Laterality: N/A;   UMBILICAL HERNIA REPAIR     UPPER GASTROINTESTINAL ENDOSCOPY  10/12/2010   barrett's, fondic gland polyps, duodenitis   Social History   Social History Narrative   Married has 3 children he is retired   Former smoker, no alcohol tobacco or drug use now   family history includes Colitis in an other family member; Diabetes in his mother; Heart disease in his brother, father, and mother; Rheumatic fever in  his sister.   Review of Systems As per HPI  Objective:   Physical Exam @BP  118/64   Pulse 76   Ht 5\' 5"  (1.651 m)   Wt 190 lb (86.2 kg)   BMI 31.62 kg/m @  General:  NAD Eyes:   anicteric Lungs:  Clear Chest mildly teder over  left nipple + CABG scars Heart::  S1S2 no rubs, murmurs or gallops Abdomen:  soft and obese and mildly tender infraumbilical, BS+ Ext:   no edema, cyanosis or clubbing    Data Reviewed:  See HPI

## 2024-04-05 NOTE — Progress Notes (Deleted)
 Follow Up Note  RE: Angel Khan MRN: [id number] DOB: [birth year] Date of Office Visit: 04/06/2024  Referring provider: Roe Rutherford, NP Primary care provider: Roe Rutherford, NP  Chief Complaint: No chief complaint on file.  History of Present Illness: I had the pleasure of seeing [name] for a [facility]  on 04/05/2024. He is a 83 y.o. male, who is being followed for chronic cough and wheezing, GERD, allergic rhinitis. His previous allergy office visit was on 02/05/2024 with Dr. Selena Batten. Today is a regular follow up visit.  Discussed the use of AI scribe software for clinical note transcription with the patient, who gave verbal consent to proceed.  History of Present Illness            ***  Assessment and Plan: Angel Khan is a 83 y.o. male with: Respiratory infection Patient feels like he is coming down with a "cold" due to sore throat, head congestion and increased coughing that started today. Denies fevers. If you do not feel better, please go to urgent care or ER to get tested for covid and flu. See below for symptomatic management. Finish the prednisone as prescribed - this will help with any inflammation in the lungs. Get Chest X-ray - no acute process.    Chronic cough and wheezing Past history - Followed by pulmonology, GI, ENT and cardiology. Tried Advair, Dulera, Symbicort, azithromycin and flutter valve with no benefit. Currently on PPI for reflux. 2020 CXR no acute disease. 2016 IgE 610 positive to cat, dog, grass, dust mites, trees, ragweed, weed. 09/08/2019 eosinophils 400. 2021 spirometry showed some mild restriction. 2021 skin testing positive to grass and dog. No pets at home.  Interim history - New onset wheezing and coughing for the past couple of months, exacerbated by lying flat and after meals. Already taking PPI and famotidine. Work up in the past showed mild restriction in spirometry with no response to  bronchodilator treatment. Did not perform spirometry today due to current viral symptoms. This seems to be multifactorial in nature. May use albuterol rescue inhaler 2 puffs every 4 to 6 hours as needed for shortness of breath, chest tightness, coughing, and wheezing. May use albuterol rescue inhaler 2 puffs 5 to 15 minutes prior to strenuous physical activities. Monitor frequency of use - if you need to use it more than twice per week on a consistent basis let us know.    Gastroesophageal reflux disease, unspecified whether esophagitis present Continue lifestyle and dietary modifications. Continue pantoprazole 40mg  once day - nothing to eat or drink for 20-30 minutes afterwards.  Continue famotidine 20mg  once a day.   Other allergic rhinitis Past history - 2016 bloodwork IgE 610, positive to cat, dog, grass, dust mites, trees, ragweed, weed. 09/08/2019 eosinophils 400. 2021 skin testing positive to grass and dog only.  Use over the counter antihistamines such as Zyrtec (cetirizine), Claritin (loratadine), Allegra (fexofenadine), or Xyzal (levocetirizine) daily as needed. May switch antihistamines every few months. Continue Singulair (montelukast) 10mg  daily at night.   Elevated blood pressure reading Please follow up with PCP regarding this.   Assessment and Plan              No follow-ups on file.  No orders of the defined types were placed in this encounter.  Lab Orders  No laboratory test(s) ordered today    Diagnostics: Spirometry:  Tracings reviewed. His effort: {Blank single:19197::"Good reproducible efforts.","It was hard to get consistent efforts and  there is a question as to whether this reflects a maximal maneuver.","Poor effort, data can not be interpreted."} FVC: ***L FEV1: ***L, ***% predicted FEV1/FVC ratio: ***% Interpretation: {Blank single:19197::"Spirometry consistent with mild obstructive disease","Spirometry consistent with moderate obstructive  disease","Spirometry consistent with severe obstructive disease","Spirometry consistent with possible restrictive disease","Spirometry consistent with mixed obstructive and restrictive disease","Spirometry uninterpretable due to technique","Spirometry consistent with normal pattern","No overt abnormalities noted given today's efforts"}.  Please see scanned spirometry results for details.  Skin Testing: {Blank single:19197::"Select foods","Environmental allergy panel","Environmental allergy panel and select foods","Food allergy panel","None","Deferred due to recent antihistamines use"}. *** Results discussed with patient/family.   Medication List:  Current Outpatient Medications  Medication Sig Dispense Refill  . albuterol (PROVENTIL) (2.5 MG/3ML) 0.083% nebulizer solution Take 3 mLs (2.5 mg total) by nebulization every 4 (four) hours as needed for wheezing or shortness of breath (coughing fits). 75 mL 1  . albuterol (VENTOLIN HFA) 108 (90 Base) MCG/ACT inhaler Inhale 2 puffs into the lungs every 4 (four) hours as needed for wheezing or shortness of breath (coughing fits). 18 g 1  . alfuzosin (UROXATRAL) 10 MG 24 hr tablet Take 10 mg by mouth daily with breakfast.    . Ascorbic Acid (VITAMIN C WITH ROSE HIPS) 500 MG tablet Take 1,000 mg by mouth daily.    [name] aspirin EC 81 MG tablet Take 81 mg by mouth daily. Swallow whole.    . benzonatate (TESSALON PERLES) 100 MG capsule Take 2 capsules (200 mg total) by mouth 3 (three) times daily as needed for cough. 40 capsule 0  . cloNIDine (CATAPRES) 0.1 MG tablet Take 0.1 mg by mouth daily.    . clopidogrel (PLAVIX) 75 MG tablet Take 1 tablet (75 mg total) by mouth at bedtime. Restart on 04/27/22    . co-enzyme Q-10 30 MG capsule Take 30 mg by mouth 3 (three) times daily.    . diclofenac Sodium (VOLTAREN) 1 % GEL Apply 2 g topically 4 (four) times daily.    [name] ezetimibe (ZETIA) 10 MG tablet Take 10 mg by mouth every morning.    . famotidine (PEPCID) 20 MG  tablet Take 20 mg by mouth daily.    [name] gabapentin (NEURONTIN) 100 MG capsule Take 100 mg by mouth at bedtime.    [name] glucosamine-chondroitin 500-400 MG tablet Take 1 tablet by mouth every evening.    [name] HYDROcodone-acetaminophen (NORCO/VICODIN) 5-325 MG tablet Take 1 tablet by mouth every 4 (four) hours as needed for severe pain (pain score 7-10).    . hydrOXYzine (ATARAX) 25 MG tablet Take 25 mg by mouth 3 (three) times daily as needed.    . isosorbide mononitrate (IMDUR) 60 MG 24 hr tablet Take 60 mg by mouth at bedtime.    [name] loratadine (CLARITIN) 10 MG tablet Take 10 mg by mouth daily.    [name] losartan (COZAAR) 100 MG tablet Take 100 mg by mouth daily.    . meclizine (ANTIVERT) 25 MG tablet Take 25 mg by mouth 2 (two) times daily.    . methylPREDNISolone (MEDROL DOSEPAK) 4 MG TBPK tablet Take 4 mg by mouth as directed.    . metoprolol tartrate (LOPRESSOR) 25 MG tablet Take 12.5 mg by mouth 2 (two) times daily.    . montelukast (SINGULAIR) 10 MG tablet Take 1 tablet (10 mg total) by mouth at bedtime. 30 tablet 11  . pantoprazole (PROTONIX) 40 MG tablet Take 1 tablet (40 mg total) by mouth 2 (two) times daily before a meal. 180 tablet 3  .  POTASSIUM PO Take 99 mg by mouth daily.    . pravastatin (PRAVACHOL) 20 MG tablet Take 20 mg by mouth daily.    . ranolazine (RANEXA) 500 MG 12 hr tablet Take 500 mg by mouth 2 (two) times daily.    . sertraline (ZOLOFT) 50 MG tablet Take 50 mg by mouth daily.    . tamsulosin (FLOMAX) 0.4 MG CAPS capsule Take 2 capsules (0.8 mg total) by mouth daily. 60 capsule 11  . vitamin B-12 (CYANOCOBALAMIN) 1000 MCG tablet Take 1,000 mcg by mouth daily.     No current facility-administered medications for this visit.   Allergies: Allergies  Allergen Reactions  . Depakote [Divalproex Sodium]     Hair loss   . Statins Other (See Comments)    Muscle cramps  . Magnesium Rash    Liquid    I reviewed his past medical history, social history, family history, and  environmental history and no significant changes have been reported from his previous visit.  Review of Systems  Constitutional:  Negative for appetite change, chills, fever and unexpected weight change.  HENT:  Positive for congestion, rhinorrhea and sneezing.   Eyes:  Negative for itching.  Respiratory:  Positive for cough, chest tightness, shortness of breath and wheezing.   Cardiovascular:  Negative for chest pain.  Gastrointestinal:  Negative for abdominal pain.  Genitourinary:  Negative for difficulty urinating.  Skin:  Negative for rash.  Allergic/Immunologic: Positive for environmental allergies.  Neurological:  Positive for headaches.   Objective: There were no vitals taken for this visit. There is no height or weight on file to calculate BMI. Physical Exam Vitals and nursing note reviewed.  Constitutional:      Appearance: Normal appearance. He is well-developed.  HENT:     Head: Normocephalic and atraumatic.     Right Ear: Tympanic membrane and external ear normal.     Left Ear: Tympanic membrane and external ear normal.     Nose: Nose normal.     Mouth/Throat:     Mouth: Mucous membranes are moist.     Pharynx: Oropharynx is clear.  Eyes:     Conjunctiva/sclera: Conjunctivae normal.  Cardiovascular:     Rate and Rhythm: Normal rate and regular rhythm.     Heart sounds: Normal heart sounds. No murmur heard.    No friction rub. No gallop.  Pulmonary:     Effort: Pulmonary effort is normal.     Breath sounds: Rales (slight rales on lower lobes b/l) present. No wheezing or rhonchi.     Comments: Decreased breath sounds throughout - not much improvement post bronchodilator treatment. Musculoskeletal:     Cervical back: Neck supple.  Skin:    General: Skin is warm.     Findings: No rash.  Neurological:     Mental Status: He is alert and oriented to person, place, and time.  Psychiatric:        Behavior: Behavior normal.  Previous notes and tests were  reviewed. The plan was reviewed with the patient/family, and all questions/concerned were addressed.  It was my pleasure to see Angel Khan today and participate in his care. Please feel free to contact me with any questions or concerns.  Sincerely,  Wyline Mood, DO Allergy & Immunology  [facility] office: [phone] [facility] office: [phone]

## 2024-04-06 ENCOUNTER — Ambulatory Visit: Payer: Medicare Other | Admitting: Allergy

## 2024-04-20 ENCOUNTER — Ambulatory Visit (INDEPENDENT_AMBULATORY_CARE_PROVIDER_SITE_OTHER)

## 2024-04-20 ENCOUNTER — Ambulatory Visit (INDEPENDENT_AMBULATORY_CARE_PROVIDER_SITE_OTHER): Admitting: Family Medicine

## 2024-04-20 ENCOUNTER — Ambulatory Visit: Payer: Self-pay

## 2024-04-20 ENCOUNTER — Encounter: Payer: Self-pay | Admitting: Family Medicine

## 2024-04-20 VITALS — BP 90/50 | HR 65 | Temp 97.7°F | Resp 22 | Wt 191.0 lb

## 2024-04-20 DIAGNOSIS — J4541 Moderate persistent asthma with (acute) exacerbation: Secondary | ICD-10-CM

## 2024-04-20 DIAGNOSIS — J45909 Unspecified asthma, uncomplicated: Secondary | ICD-10-CM

## 2024-04-20 DIAGNOSIS — R053 Chronic cough: Secondary | ICD-10-CM

## 2024-04-20 DIAGNOSIS — R059 Cough, unspecified: Secondary | ICD-10-CM

## 2024-04-20 DIAGNOSIS — R062 Wheezing: Secondary | ICD-10-CM

## 2024-04-20 DIAGNOSIS — R0602 Shortness of breath: Secondary | ICD-10-CM

## 2024-04-20 HISTORY — DX: Unspecified asthma, uncomplicated: J45.909

## 2024-04-20 MED ORDER — BUDESONIDE 0.5 MG/2ML IN SUSP: 0.5000 mg | Freq: Two times a day (BID) | RESPIRATORY_TRACT | 0 refills | Status: DC

## 2024-04-20 NOTE — Patient Instructions (Addendum)
 Cough/wheeze/reactive airway disease Get a chest x-ray Begin Pulmicort  0.5 mg by nebulizer twice a day for 2 weeks Continue albuterol  2 puffs every 4 hours as needed for cough or wheeze OR Instead use albuterol  0.083% solution via nebulizer one unit vial every 4 hours as needed for cough or wheeze Return to the clinic or go to the ED if your symptoms worsen or do not improve  Allergic rhinitis Continue allergen avoidance measures directed toward cat, dog, grass, dust mite, tree pollen, weed pollen, and ragweed pollen as listed below Continue montelukast  10 mg once a day Continue Flonase nasal spray 2 sprays in each nostril once a day if needed for runny nose or itch Continue azelastine 2 sprays in each nostril up to twice a day if needed for runny nose Consider saline nasal rinses as needed for nasal symptoms. Use this before any medicated nasal sprays for best result  Reflux Continue famotidine  and pantoprazole  as previously ordered Continue to follow-up with your GI doctor as recommended  Headache Follow-up with neurology for intermittent right sided frontal headache  Call the clinic if this treatment plan is not working well for you.    Follow up in 2 weeks or sooner if needed.

## 2024-04-20 NOTE — Progress Notes (Unsigned)
 [address] [place] [address] Dept: [phone]  FOLLOW UP NOTE  Patient ID: Angel Khan, male    DOB: Jan 21, 1941  Age: 83 y.o. MRN: [id number] Date of Office Visit: 04/20/2024  Assessment  Chief Complaint: Wheezing (That has got worse over the past 3 weeks. Has been to the ER 3 times. ) and Fatigue  HPI Angel Khan is an 83 year old male who presents to the clinic for an evaluation of wheeze. He was last seen in this clinic on 02/05/2024 as a new patient for evaluation of allergic rhintiis, cough, wheeze, and reflux. At that time, he was started on an antibiotic for respiratory infection. In the interim, he received cefdinir  on 02/20/2024 for otitis media.  He has recently been to the emergency department 3 times over the last several weeks for evaluation of chest pain.  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  His problem list includes essential hypertension, CAD, tracheomalacia, obstructive sleep apnea, reflux with Barrett's esophagus  At today's visit, he reports his breathing has been not well controlled over the last several weeks with symptoms including shortness of breath which is worse with activity, wheeze, and cough producing mucus occurring in the daytime and nighttime.  He reports that he uses albuterol  frequently over the last several weeks with only moderate relief of symptoms.  Allergic rhinitis is reported as moderately well-controlled with symptoms including clear rhinorrhea, nasal and nasal congestion.  He continues montelukast  10 mg once a day and is not currently using nasal steroid spray, nasal saline rinses, or taking an antihistamine.    He reports headache occurring in the right forehead or temple area over the last 3 weeks which lasts for several minutes at a time before full resolution.  He denies dizziness, vision changes, gait changes, or diaphoresis.  He continues to  follow-up with [facility] neurology and sleep/[place].  His current medications are listed in the chart.  Chart review Cardiac Catheterization:  Conclusion: Non-obstructive CAD as mentioned above. Normal LVEDP   Echocardiogram: Echocardiogram Complete WO Enhancing Agent Result Date: 03/12/2024 Left Ventricle: Systolic function is low normal. EF: 50-55%. Right Ventricle: Systolic function is normal. Tricuspid Valve: There is moderate regurgitation. Tricuspid Valve: The right ventricular systolic pressure is mildly elevated (37-49 mmHg). 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 AVA by VTI is 1.7 cm. VTI ratio 0.56.  Echo Ltd W/Dop and Color Flow W/O Enhancing Agent Result Date: 03/23/2024 Left Ventricle: Left ventricle size is normal. Left Ventricle: Systolic function is normal. EF: 55-60%. Right Ventricle: Right ventricle size is normal. Right Ventricle: Systolic function is normal. Tricuspid Valve: There is moderate regurgitation. Pericardium: There is no pericardial effusion.   Drug Allergies:  Allergies  Allergen Reactions  . Depakote [Divalproex Sodium]     Hair loss   . Statins Other (See Comments)    Muscle cramps  . Magnesium  Rash    Liquid     Physical Exam: BP (!) 90/50   Pulse 65   Temp 97.7 F (36.5 C)   Resp (!) 22   Wt 191 lb (86.6 kg)   SpO2 96%   BMI 31.78 kg/m    Physical Exam Vitals reviewed.  Constitutional:      Appearance: Normal appearance.  HENT:     Head: Normocephalic and atraumatic.     Right Ear: Tympanic membrane normal.     Left Ear: Tympanic membrane normal.  Nose:     Comments: Bilateral naris slightly erythematous with thin clear nasal drainage noted.  Pharynx normal.  Ears normal.  Eyes normal.    Mouth/Throat:     Pharynx: Oropharynx is clear.  Eyes:     Conjunctiva/sclera: Conjunctivae normal.  Cardiovascular:     Rate and Rhythm: Normal rate and regular rhythm.     Heart sounds: Normal heart sounds. No murmur  heard. Pulmonary:     Effort: Pulmonary effort is normal.     Breath sounds: Normal breath sounds.     Comments: Bilateral expiratory wheeze.  No rhonchi Musculoskeletal:        General: Normal range of motion.     Cervical back: Normal range of motion and neck supple.  Skin:    General: Skin is warm and dry.  Neurological:     Mental Status: He is alert and oriented to person, place, and time.  Psychiatric:        Mood and Affect: Mood normal.        Behavior: Behavior normal.        Thought Content: Thought content normal.        Judgment: Judgment normal.    Diagnostics: Patient refused spirometry  Assessment and Plan: 1. Moderate persistent reactive airway disease with acute exacerbation   2. Chronic cough   3. Wheeze     Meds ordered this encounter  Medications  . DISCONTD: budesonide  (PULMICORT ) 0.5 MG/2ML nebulizer solution    Sig: Take 2 mLs (0.5 mg total) by nebulization 2 (two) times daily for 14 days.    Dispense:  56 mL    Refill:  0  . budesonide  (PULMICORT ) 0.5 MG/2ML nebulizer solution    Sig: Take 2 mLs (0.5 mg total) by nebulization 2 (two) times daily for 14 days.    Dispense:  56 mL    Refill:  0    Patient Instructions  Cough/wheeze/reactive airway disease Get a chest x-ray Begin Pulmicort  0.5 mg by nebulizer twice a day for 2 weeks Continue albuterol  2 puffs every 4 hours as needed for cough or wheeze OR Instead use albuterol  0.083% solution via nebulizer one unit vial every 4 hours as needed for cough or wheeze Return to the clinic or go to the ED if your symptoms worsen or do not improve  Allergic rhinitis Continue allergen avoidance measures directed toward cat, dog, grass, dust mite, tree pollen, weed pollen, and ragweed pollen as listed below Continue montelukast  10 mg once a day Continue Flonase nasal spray 2 sprays in each nostril once a day if needed for runny nose or itch Continue azelastine 2 sprays in each nostril up to twice a day if  needed for runny nose Consider saline nasal rinses as needed for nasal symptoms. Use this before any medicated nasal sprays for best result  Reflux Continue famotidine  and pantoprazole  as previously ordered Continue to follow-up with your GI doctor as recommended  Headache Follow-up with neurology for intermittent right sided frontal headache  Call the clinic if this treatment plan is not working well for you.    Follow up in 2 weeks or sooner if needed.   No follow-ups on file.    Thank you for the opportunity to care for this patient.  Please do not hesitate to contact me with questions.  Marinus Sic, FNP [facility] 

## 2024-04-20 NOTE — Progress Notes (Signed)
 Can you please call this patient and let him know his chest xray was normal and we will continue with the plan that was outlined earlier. Thank you

## 2024-04-20 NOTE — Telephone Encounter (Signed)
 Chief Complaint: SOB Symptoms: mild SOB, wheezing, cough Frequency: x 1 month, worsening Pertinent Negatives: Patient denies N/A. Disposition: [] ED /[] Urgent Care (no appt availability in office) / [] Appointment(In office/virtual)/ []  [place] [facility]/ [] Home Care/ [] Refused Recommended Disposition /[] [place] [facility]/ [x]  Transferred call to Specialist Additional Notes: Patient states he has been in and out of the emergency room over the past month with heart and lung problems. Patient is established [facility]. Patient states he was trying to reach [facility] Allergist office. Triaged patient and transferred to [facility] [place] at [facility].  Copied from CRM [id number]. Topic: Clinical - Red Word Triage >> Apr 20, 2024  9:17 AM Earnestine Goes B wrote: [facility] that prompted transfer to Nurse Triage:pt called stating he can't breathe. Sob Reason for Disposition  [1] MILD difficulty breathing (e.g., minimal/no SOB at rest, SOB with walking, pulse <100) AND [2] NEW-onset or WORSE than normal  Answer Assessment - Initial Assessment Questions 1. RESPIRATORY STATUS: "Describe your breathing?" (e.g., wheezing, shortness of breath, unable to speak, severe coughing)      Shortness of breath. Patient states he stays that way.  2. ONSET: "When did this breathing problem begin?"      Worsening for the past month.  3. PATTERN "Does the difficult breathing come and go, or has it been constant since it started?"      He states if he exerts he is SOB and states he has a cold in addition.  4. SEVERITY: "How bad is your breathing?" (e.g., mild, moderate, severe)    - MILD: No SOB at rest, mild SOB with walking, speaks normally in sentences, can lie down, no retractions, pulse < 100.    - MODERATE: SOB at rest, SOB with minimal exertion and prefers to sit, cannot lie down flat, speaks in phrases, mild retractions, audible wheezing, pulse 100-120.    -  SEVERE: Very SOB at rest, speaks in single words, struggling to breathe, sitting hunched forward, retractions, pulse > 120      Mild.  5. RECURRENT SYMPTOM: "Have you had difficulty breathing before?" If Yes, ask: "When was the last time?" and "What happened that time?"      Yes.  6. CARDIAC HISTORY: "Do you have any history of heart disease?" (e.g., heart attack, angina, bypass surgery, angioplasty)      Cardiac stents/blockage, open heart surgery.  7. LUNG HISTORY: "Do you have any history of lung disease?"  (e.g., pulmonary embolus, asthma, emphysema)     Denies.  8. CAUSE: "What do you think is causing the breathing problem?"      Patient states he was supposed to be following up with [place] [facility] [place] at [facility].  9. OTHER SYMPTOMS: "Do you have any other symptoms? (e.g., dizziness, runny nose, cough, chest pain, fever)     He states in the past 3 weeks he has been to the ED for chest pains three times and had stents placed.  10. O2 SATURATION MONITOR:  "Do you use an oxygen saturation monitor (pulse oximeter) at home?" If Yes, ask: "What is your reading (oxygen level) today?" "What is your usual oxygen saturation reading?" (e.g., 95%)       He states he doesn't check that at home.  11. PREGNANCY: "Is there any chance you are pregnant?" "When was your last menstrual period?"       N/A.  12. TRAVEL: "Have you traveled  out of the country in the last month?" (e.g., travel history, exposures)       Denies.  Protocols used: Breathing Difficulty-A-AH

## 2024-04-21 ENCOUNTER — Telehealth: Payer: Self-pay

## 2024-04-21 NOTE — Telephone Encounter (Signed)
 I called the patient and to check on him and he is feeling better as he started to use the Pulmicort  nebulizer solution. He asked if we could refer him somewhere to get a cpap implant procedure as he cannot use the mask because of the strap on the back of his head. I asked the patient if he goes to see pulmonary and he said he did. I informed the patient that pulmonary does cpap management and to follow up with them. The patient would still like to know if there's someone he can be referred to.

## 2024-04-21 NOTE — Telephone Encounter (Signed)
-----   Message from Marinus Sic sent at 04/21/2024  8:19 AM EDT ----- Can you please check on how this patient is doing? Thank you

## 2024-04-23 NOTE — Telephone Encounter (Signed)
 His pulmonary specialist will have the most current update on sleep apnea implants. Thank you

## 2024-04-27 ENCOUNTER — Ambulatory Visit
Admission: EM | Admit: 2024-04-27 | Discharge: 2024-04-27 | Disposition: A | Discharge status: Home / Self Care | Attending: Family Medicine | Admitting: Family Medicine

## 2024-04-27 ENCOUNTER — Encounter: Payer: Self-pay | Admitting: Emergency Medicine

## 2024-04-27 DIAGNOSIS — M62830 Muscle spasm of back: Secondary | ICD-10-CM

## 2024-04-27 DIAGNOSIS — M545 Low back pain, unspecified: Secondary | ICD-10-CM

## 2024-04-27 MED ORDER — BACLOFEN 10 MG PO TABS: 10.0000 mg | ORAL_TABLET | Freq: Three times a day (TID) | ORAL | 0 refills | Status: AC

## 2024-04-27 MED ORDER — HYDROCODONE-ACETAMINOPHEN 10-325 MG PO TABS: 1.0000 | ORAL_TABLET | Freq: Three times a day (TID) | ORAL | 0 refills | Status: AC | PRN

## 2024-04-27 NOTE — ED Provider Notes (Signed)
 [facility]    CSN: [id number] Arrival date & time: 04/27/24  4098      History   Chief Complaint Chief Complaint  Patient presents with   Back Pain    HPI Angel Khan is a 83 y.o. male.   HPI Very pleasant 83 year old male presents with back pain for 2 days.  PMH significant for CAD (s/p PTCA), COPD, and HTN.  Patient is currently on Plavix  and denies any unusual bleeding.  Patient reports right-sided low back pain no apparent injury; however, did receive steroid injection into right side of lower back from Ortho which was very painful at time of injection and continues to be painful.  Patient reports currently taking Vicodin (last dose at 5 AM this morning) and applying cream to the area with little to no relief.  Patient reports Ortho has not returned his calls for pain relief or answering questions of why injection is hurting so much.  Past Medical History:  Diagnosis Date   Adenomatous colon polyp    Barrett esophagus    CAD (coronary artery disease)    Chronic headaches    Complication of anesthesia    Hard to wake up   COPD (chronic obstructive pulmonary disease) (HCC)    Coronary atherosclerosis    Diverticulitis of colon 11/2011   Diagnosed by CT scan [facility]   Dyspnea    GERD (gastroesophageal reflux disease)    Hemorrhoids    History of kidney stones    HLD (hyperlipidemia)    Horseshoe kidney    HTN (hypertension)    Melanoma (HCC)    Poor short term memory    Pre-diabetes    Reactive airway disease 04/20/2024   Sleep apnea    Can not use a cpap   Tracheobronchomalacia    Urticaria     Patient Active Problem List   Diagnosis Date Noted   Reactive airway disease 04/20/2024   Wheeze 04/20/2024   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   Asymptomatic carotid artery stenosis without infarction, right 03/14/2021   Gastroesophageal reflux disease 04/20/2020   OSA (obstructive sleep apnea) 04/04/2020    Seasonal and perennial allergic rhinitis 04/04/2020   DOE (dyspnea on exertion) 08/04/2019   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 Jerri Morale   COLONOSCOPY W/ BIOPSIES AND POLYPECTOMY  09/08/2007   adenomatous polyps, diverticulosis, external hemorrhoids   COLONOSCOPY WITH PROPOFOL  N/A 05/07/2021   Procedure: COLONOSCOPY WITH PROPOFOL ;  Surgeon: Kenney Peacemaker, MD;  Location: [facility];  Service: Endoscopy;  Laterality: N/A;   CORONARY ARTERY BYPASS GRAFT  07/11/2009   off-pump CABG: LIMA-LAD 07/11/09; occluded graft 09/07/09, s/p DES LAD just after DIAG   CORONARY STENT PLACEMENT     x 5   ENDARTERECTOMY Left 03/14/2021   Procedure: LEFT CAROTID ARTERY ENDARTERECTOMY;  Surgeon: Margherita Shell, MD;  Location: [facility] OR;  Service: Vascular;  Laterality: Left;  ESOPHAGOGASTRODUODENOSCOPY (EGD) WITH PROPOFOL  N/A 05/07/2021   Procedure: ESOPHAGOGASTRODUODENOSCOPY (EGD) WITH PROPOFOL ;  Surgeon: Kenney Peacemaker, MD;  Location: [facility];  Service: Endoscopy;  Laterality: N/A;   HERNIA REPAIR     abdominal   LEFT CAROTID ARTERY ENDARTERECTOMY   03/14/2021   POLYPECTOMY  05/07/2021   Procedure: POLYPECTOMY;  Surgeon: Kenney Peacemaker, MD;  Location: [facility];  Service: Endoscopy;;   SINOSCOPY     TONSILLECTOMY     TRANSFORAMINAL LUMBAR INTERBODY FUSION W/ MIS 2 LEVEL N/A 04/22/2022   Procedure: Lumbar Four-Five, Lumbar Five-Sacral One Minimally  Invasive Laminectomies with Transforaminal Lumbar Interbody Fusion;  Surgeon: Cannon Champion, MD;  Location: [facility] OR;  Service: Neurosurgery;  Laterality: N/A;   UMBILICAL HERNIA REPAIR     UPPER GASTROINTESTINAL ENDOSCOPY  10/12/2010   barrett's, fondic gland polyps, duodenitis       Home Medications    Prior to Admission medications   Medication Sig Start Date End Date Taking? Authorizing Provider  albuterol  (PROVENTIL ) (2.5 MG/3ML) 0.083% nebulizer solution Take 3 mLs (2.5 mg total) by nebulization every 4 (four) hours as needed for wheezing or shortness of breath (coughing fits). 02/05/24  Yes Trudy Fusi, DO  albuterol  (VENTOLIN  HFA) 108 (90 Base) MCG/ACT inhaler Inhale 2 puffs into the lungs every 4 (four) hours as needed for wheezing or shortness of breath (coughing fits). 02/05/24  Yes Trudy Fusi, DO  alfuzosin (UROXATRAL) 10 MG 24 hr tablet Take 10 mg by mouth daily with breakfast. 12/15/23 12/14/24 Yes [provider]  Ascorbic Acid  (VITAMIN C WITH ROSE HIPS) 500 MG tablet Take 1,000 mg by mouth daily.   Yes [provider]  aspirin  EC 81 MG tablet Take 81 mg by mouth daily. Swallow whole.   Yes [provider]  baclofen (LIORESAL) 10 MG tablet Take 1 tablet (10 mg total) by mouth 3 (three) times daily for 7 days. 04/27/24 05/04/24 Yes Leonides Ramp, FNP  benzonatate  (TESSALON  PERLES) 100 MG capsule Take 2 capsules (200 mg total) by mouth 3 (three) times daily as needed for cough. 11/05/23  Yes Leonides Ramp, FNP  budesonide  (PULMICORT ) 0.5 MG/2ML nebulizer solution Take 2 mLs (0.5 mg total) by nebulization 2 (two) times daily for 14 days. 04/20/24 05/04/24 Yes Ambs, Jeanmarie Millet, FNP  cloNIDine  (CATAPRES ) 0.1 MG tablet Take 0.1 mg by mouth daily.   Yes [provider]  clopidogrel  (PLAVIX ) 75 MG tablet Take 1 tablet (75 mg total) by mouth at bedtime. Restart on 04/27/22 04/23/22  Yes Cannon Champion, MD  co-enzyme Q-10 30 MG capsule Take 30 mg by mouth 3  (three) times daily. 04/30/23  Yes [provider]  diclofenac Sodium (VOLTAREN) 1 % GEL Apply 2 g topically 4 (four) times daily. 01/20/24  Yes [provider]  ezetimibe  (ZETIA ) 10 MG tablet Take 10 mg by mouth every morning.   Yes [provider]  famotidine  (PEPCID ) 20 MG tablet Take 20 mg by mouth daily.   Yes [provider]  gabapentin (NEURONTIN) 100 MG capsule Take 100 mg by mouth at bedtime.   Yes [provider]  glipiZIDE (GLUCOTROL XL) 2.5 MG 24 hr tablet Take 2.5 mg by mouth. 04/07/24  Yes [provider]  glucosamine-chondroitin 500-400 MG tablet Take 1 tablet by mouth every evening.   Yes [provider]  HYDROcodone-acetaminophen  (NORCO) 10-325 MG tablet Take 1 tablet by mouth every 8 (eight) hours as needed for up to 7 days. 04/27/24 05/04/24 Yes  Joleene Burnham, FNP  hydrOXYzine (ATARAX) 25 MG tablet Take 25 mg by mouth 3 (three) times daily as needed. 12/15/23  Yes [provider]  isosorbide  mononitrate (IMDUR ) 60 MG 24 hr tablet Take 60 mg by mouth at bedtime.   Yes [provider]  loratadine (CLARITIN) 10 MG tablet Take 10 mg by mouth daily. 09/22/17  Yes [provider]  losartan  (COZAAR ) 100 MG tablet Take 100 mg by mouth daily.   Yes [provider]  meclizine (ANTIVERT) 25 MG tablet Take 25 mg by mouth 2 (two) times daily. 06/12/23 06/11/24 Yes [provider]  metoprolol  tartrate (LOPRESSOR ) 25 MG tablet Take 12.5 mg by mouth 2 (two) times daily. 03/13/24  Yes [provider]  montelukast  (SINGULAIR ) 10 MG tablet Take 1 tablet (10 mg total) by mouth at bedtime. 07/01/19  Yes Diamond Formica, MD  nitroGLYCERIN (NITROSTAT) 0.4 MG SL tablet Place 0.4 mg under the tongue. 03/29/24  Yes [provider]  pantoprazole  (PROTONIX ) 40 MG tablet Take 1 tablet (40 mg total) by mouth 2 (two) times daily before a meal. 03/29/24  Yes Kenney Peacemaker, MD  POTASSIUM PO Take 99 mg  by mouth daily.   Yes [provider]  pravastatin  (PRAVACHOL ) 20 MG tablet Take 20 mg by mouth daily.   Yes [provider]  ranolazine (RANEXA) 500 MG 12 hr tablet Take 500 mg by mouth 2 (two) times daily. 03/23/24  Yes [provider]  sertraline  (ZOLOFT ) 50 MG tablet Take 50 mg by mouth daily. 05/08/20  Yes [provider]  tamsulosin  (FLOMAX ) 0.4 MG CAPS capsule Take 2 capsules (0.8 mg total) by mouth daily. 02/04/24  Yes Scarlet Curly, MD  vitamin B-12 (CYANOCOBALAMIN ) 1000 MCG tablet Take 1,000 mcg by mouth daily.   Yes [provider]    Family History Family History  Problem Relation Age of Onset   Diabetes Mother    Heart disease Mother    Heart disease Father    Rheumatic fever Sister    Heart disease Brother    Colitis Other        niece   Colon cancer Neg Hx    Stomach cancer Neg Hx    Pancreatic cancer Neg Hx    Allergic rhinitis Neg Hx    Asthma Neg Hx    Eczema Neg Hx    Urticaria Neg Hx     Social History Social History   Tobacco Use   Smoking status: Former    Current packs/day: 0.00    Types: Cigarettes    Quit date: 07/30/1976    Years since quitting: 47.7   Smokeless tobacco: Never  Vaping Use   Vaping status: Never Used  Substance Use Topics   Alcohol use: No   Drug use: No     Allergies   Depakote [divalproex sodium], Statins, and Magnesium    Review of Systems Review of Systems  Musculoskeletal:  Positive for back pain.     Physical Exam Triage Vital Signs ED Triage Vitals  Encounter Vitals Group     BP      Systolic BP Percentile      Diastolic BP Percentile      Pulse      Resp      Temp      Temp src      SpO2      Weight      Height      Head Circumference  Peak Flow      Pain Score      Pain Loc      Pain Education      Exclude from Growth Chart    No data found.  Updated Vital Signs BP 137/72 (BP Location: Right Arm)   Pulse 71   Temp 97.8 F (36.6 C) (Oral)    Resp 20   SpO2 95%    Physical Exam Vitals and nursing note reviewed.  Constitutional:      Appearance: Normal appearance. He is obese.  HENT:     Head: Normocephalic and atraumatic.     Mouth/Throat:     Mouth: Mucous membranes are moist.     Pharynx: Oropharynx is clear.  Eyes:     Extraocular Movements: Extraocular movements intact.     Conjunctiva/sclera: Conjunctivae normal.     Pupils: Pupils are equal, round, and reactive to light.  Cardiovascular:     Rate and Rhythm: Normal rate and regular rhythm.     Pulses: Normal pulses.     Heart sounds: Normal heart sounds.  Pulmonary:     Effort: Pulmonary effort is normal.     Breath sounds: Normal breath sounds. No wheezing, rhonchi or rales.  Musculoskeletal:        General: Normal range of motion.     Cervical back: Normal range of motion and neck supple.     Comments: Lumbar spine (right inferior aspect): TTP patient in considerable pain today on exam, exam limited due to pain  Skin:    General: Skin is warm and dry.  Neurological:     General: No focal deficit present.     Mental Status: He is alert and oriented to person, place, and time. Mental status is at baseline.  Psychiatric:        Mood and Affect: Mood normal.        Behavior: Behavior normal.      UC Treatments / Results  Labs (all labs ordered are listed, but only abnormal results are displayed) Labs Reviewed - No data to display  EKG   Radiology No results found.  Procedures Procedures (including critical care time)  Medications Ordered in UC Medications - No data to display  Initial Impression / Assessment and Plan / UC Course  I have reviewed the triage vital signs and the nursing notes.  Pertinent labs & imaging results that were available during my care of the patient were reviewed by me and considered in my medical decision making (see chart for details).     MDM: 1.  Acute right-sided low back pain without sciatica-Rx'd Norco  10/325 mg tablet: Take 1 tablet every 8 hours as needed, as needed for acute/severe right sided lower back pain; 2.  Muscle spasm of back-Rx'd baclofen 10 mg tablet: Take 1 tablet 3 times daily, as needed for muscle spasms of lower back. Advised patient to take medication daily as needed for severe/acute back pain.  Patient advised of sedative effects of this medication.  Advised may use baclofen daily or as needed for accompanying back spasms of lower back.  Encouraged increased daily water intake to 64 ounces per day while taking these medications.  Advised if symptoms worsen and/or unresolved please follow-up with your PCP, orthopedist, or here for further evaluation.  Final Clinical Impressions(s) / UC Diagnoses   Final diagnoses:  Acute right-sided low back pain without sciatica  Muscle spasm of back     Discharge Instructions      Advised  patient to take medication daily as needed for severe/acute back pain.  Patient advised of sedative effects of this medication.  Advised may use baclofen daily or as needed for accompanying back spasms of lower back.  Encouraged increased daily water intake to 64 ounces per day while taking these medications.  Advised if symptoms worsen and/or unresolved please follow-up with your PCP, orthopedist, or here for further evaluation.     ED Prescriptions     Medication Sig Dispense Auth. Provider   HYDROcodone-acetaminophen  (NORCO) 10-325 MG tablet Take 1 tablet by mouth every 8 (eight) hours as needed for up to 7 days. 21 tablet Zebedee Segundo, FNP   baclofen (LIORESAL) 10 MG tablet Take 1 tablet (10 mg total) by mouth 3 (three) times daily for 7 days. 21 tablet Chancellor Vanderloop, FNP      I have reviewed the PDMP during this encounter.   Leonides Ramp, FNP 04/27/24 1000

## 2024-04-27 NOTE — Discharge Instructions (Addendum)
 Advised patient to take medication daily as needed for severe/acute back pain.  Patient advised of sedative effects of this medication.  Advised may use baclofen daily or as needed for accompanying back spasms of lower back.  Encouraged increased daily water intake to 64 ounces per day while taking these medications.  Advised if symptoms worsen and/or unresolved please follow-up with your PCP, orthopedist, or here for further evaluation.

## 2024-04-27 NOTE — ED Triage Notes (Signed)
 Patient c/o right sided low back pain x 1 day.  No apparent injury.  Patient did receive a steroid injection on Friday from an ortho.  Very painful.  Patient has taken Hydrocodone and applying cream to the area.

## 2024-04-28 ENCOUNTER — Telehealth: Payer: Self-pay

## 2024-04-28 NOTE — Telephone Encounter (Signed)
 Pt had question regarding tick bite, wants to get abx. Sts he will come by later.

## 2024-05-03 NOTE — Telephone Encounter (Signed)
 Pt called and had a question about baclofen 

## 2024-05-03 NOTE — Patient Instructions (Incomplete)
 Cough/wheeze/reactive airway disease Get a chest x-ray Begin Pulmicort  0.5 mg by nebulizer twice a day for 2 weeks Continue albuterol  2 puffs every 4 hours as needed for cough or wheeze OR Instead use albuterol  0.083% solution via nebulizer one unit vial every 4 hours as needed for cough or wheeze Return to the clinic or go to the ED if your symptoms worsen or do not improve  Allergic rhinitis Continue allergen avoidance measures directed toward cat, dog, grass, dust mite, tree pollen, weed pollen, and ragweed pollen as listed below Continue montelukast  10 mg once a day Continue Flonase nasal spray 2 sprays in each nostril once a day if needed for runny nose or itch Continue azelastine 2 sprays in each nostril up to twice a day if needed for runny nose Consider saline nasal rinses as needed for nasal symptoms. Use this before any medicated nasal sprays for best result  Reflux Continue famotidine  and pantoprazole  as previously ordered Continue to follow-up with your GI doctor as recommended  Headache Follow-up with neurology for intermittent right sided frontal headache  Call the clinic if this treatment plan is not working well for you.    Follow up in 2 weeks or sooner if needed.

## 2024-05-03 NOTE — Progress Notes (Unsigned)
   [address] [place] [address] Dept: [phone]  FOLLOW UP NOTE  Patient ID: Angel Khan, male    DOB: [birth year]  Age: 83 y.o. MRN: [id number] Date of Office Visit: 05/04/2024  Assessment  Chief Complaint: No chief complaint on file.  HPI Angel Khan is an 83 year old male who presents to the clinic for a follow up visit. He was last seen in this clinic on 04/20/2024 by Marinus Sic, FNP, for evaluation of cough, wheeze, allergic rhinitis, reflux, and headache.  His problem list includes essential hypertension, CAD, tracheomalacia, obstructive sleep apnea, reflux with Barrett's esophagus   His last environmental allergy  testing was on 04/20/2020 and was positive to grass pollen, weed pollen, and dog.   Discussed the use of AI scribe software for clinical note transcription with the patient, who gave verbal consent to proceed.  History of Present Illness    Chart review: CLINICAL DATA:  Wheezing, shortness of breath, cough   EXAM: CHEST - 2 VIEW   COMPARISON:  02/05/2024   FINDINGS: Prior CABG. Heart and mediastinal contours are within normal limits. No focal opacities or effusions. No acute bony abnormality. Aortic atherosclerosis.   IMPRESSION: No active cardiopulmonary disease.     Electronically Signed   By: Janeece Mechanic M.D.   On: 04/20/2024 14:50  Drug Allergies:  Allergies  Allergen Reactions   Depakote [Divalproex Sodium]     Hair loss    Statins Other (See Comments)    Muscle cramps   Magnesium  Rash    Liquid     Physical Exam: There were no vitals taken for this visit.   Physical Exam  Diagnostics:    Assessment and Plan: No diagnosis found.  No orders of the defined types were placed in this encounter.   There are no Patient Instructions on file for this visit.  No follow-ups on file.    Thank you for the opportunity to care for this patient.  Please do not hesitate to contact me with questions.  Marinus Sic,  FNP [facility]

## 2024-05-04 ENCOUNTER — Ambulatory Visit (INDEPENDENT_AMBULATORY_CARE_PROVIDER_SITE_OTHER): Admitting: Family Medicine

## 2024-05-04 ENCOUNTER — Other Ambulatory Visit: Payer: Self-pay

## 2024-05-04 ENCOUNTER — Encounter: Payer: Self-pay | Admitting: Family Medicine

## 2024-05-04 VITALS — BP 132/76 | HR 66 | Temp 97.7°F | Resp 20 | Wt 187.4 lb

## 2024-05-04 DIAGNOSIS — K219 Gastro-esophageal reflux disease without esophagitis: Secondary | ICD-10-CM

## 2024-05-04 DIAGNOSIS — J3089 Other allergic rhinitis: Secondary | ICD-10-CM | POA: Diagnosis not present

## 2024-05-04 DIAGNOSIS — J4520 Mild intermittent asthma, uncomplicated: Secondary | ICD-10-CM

## 2024-05-04 DIAGNOSIS — J302 Other seasonal allergic rhinitis: Secondary | ICD-10-CM

## 2024-05-04 DIAGNOSIS — R062 Wheezing: Secondary | ICD-10-CM

## 2024-05-11 ENCOUNTER — Encounter: Payer: Self-pay | Admitting: Allergy

## 2024-05-11 ENCOUNTER — Ambulatory Visit (INDEPENDENT_AMBULATORY_CARE_PROVIDER_SITE_OTHER): Admitting: Allergy

## 2024-05-11 VITALS — BP 110/60 | HR 78 | Temp 97.9°F | Resp 20

## 2024-05-11 DIAGNOSIS — J3089 Other allergic rhinitis: Secondary | ICD-10-CM | POA: Diagnosis not present

## 2024-05-11 DIAGNOSIS — J302 Other seasonal allergic rhinitis: Secondary | ICD-10-CM

## 2024-05-11 DIAGNOSIS — R053 Chronic cough: Secondary | ICD-10-CM | POA: Diagnosis not present

## 2024-05-11 DIAGNOSIS — K219 Gastro-esophageal reflux disease without esophagitis: Secondary | ICD-10-CM

## 2024-05-11 DIAGNOSIS — J988 Other specified respiratory disorders: Secondary | ICD-10-CM | POA: Diagnosis not present

## 2024-05-11 MED ORDER — AZITHROMYCIN 250 MG PO TABS: ORAL_TABLET | ORAL | 0 refills | Status: DC

## 2024-05-11 MED ORDER — METHYLPREDNISOLONE 4 MG PO TBPK: ORAL_TABLET | ORAL | 0 refills | Status: DC

## 2024-05-11 MED ORDER — NYSTATIN 100000 UNIT/ML MT SUSP: 5.0000 mL | Freq: Four times a day (QID) | OROMUCOSAL | 0 refills | Status: DC

## 2024-05-11 NOTE — Progress Notes (Signed)
 Follow Up Note  RE: Angel Khan MRN: [id number] DOB: [birth year] Date of Office Visit: 05/11/2024  Referring provider: Lorre Rosin, NP Primary care provider: Lorre Rosin, NP  Chief Complaint: Cough (He was feeling better but is now worse than when he was last seen. Is having congestion, runny nose and has been using his nebulizer )  History of Present Illness: I had the pleasure of seeing Angel Khan for a follow up visit at the [facility] on 05/11/2024. He is a 83 y.o. male, who is being followed for RAD, allergic rhinitis, reflux. His previous allergy  office visit was on 5/6/12025 with Marinus Sic, FNP. Today is a new complaint visit of coughing.  Discussed the use of AI scribe software for clinical note transcription with the patient, who gave verbal consent to proceed.    He began experiencing respiratory symptoms after plowing a four-acre field with a tractor last Thursday or Friday. The symptoms started with a sensation of a 'cold in my chest and my head' and have persisted since then. He has nasal congestion with a gooey consistency, coughing, wheezing, shortness of breath, and chest tightness. Symptoms worsen at night and in the morning, making it difficult for him to 'get halfway straightened out.' No fever or chills are present.  He has been using a nebulizer and has previously taken prednisone  and antibiotics, which initially provided relief. However, symptoms recurred after further dust/pollen exposure while riding the tractor. He also uses Flonase nasal spray, montelukast  at night, and continues his reflux medication. He has not been rinsing his mouth after using the nebulizer and his mouth has been feeling "raw".     Assessment and Plan: Angel Khan is a 83 y.o. male with: Respiratory infection Flared with recent dust/pollen exposure after riding in tractor in the field. Cxr normal recently. Just finished course of antibiotics and steroids for  recent tick bite. Using nebulizer machine with unknown benefit. Start medrol  pak. Start Zpak. Concerns for thrush - Use nystatin swish and swallow 5mL four times a day.  During respiratory infections/flares:  Start pulmicort  nebulizer twice a day  for 1-2 weeks until your breathing symptoms return to baseline.  Rinse mouth after each use. Pretreat with albuterol  2 puffs or albuterol  nebulizer.  If you need to use your albuterol  nebulizer machine back to back within 15-30 minutes with no relief then please go to the ER/urgent care for further evaluation.  May use albuterol  rescue inhaler 2 puffs or nebulizer every 4 to 6 hours as needed for shortness of breath, chest tightness, coughing, and wheezing. May use albuterol  rescue inhaler 2 puffs 5 to 15 minutes prior to strenuous physical activities. Monitor frequency of use - if you need to use it more than twice per week on a consistent basis let us  know.   Seasonal and perennial allergic rhinitis Past history - 2016 bloodwork IgE 610, positive to cat, dog, grass, dust mites, trees, ragweed, weed. 09/08/2019 eosinophils 400. 2021 skin testing positive to grass and dog only.  Use over the counter antihistamines such as Zyrtec (cetirizine), Claritin (loratadine), Allegra (fexofenadine), or Xyzal (levocetirizine) daily as needed. May switch antihistamines every few months. Continue Singulair  (montelukast ) 10mg  daily at night. Stressed importance of using saline rinse after being outdoors in his tractor to prevent another flare.  Use Flonase (fluticasone) nasal spray 1-2 sprays per nostril once a day as needed for nasal congestion.  Nasal saline spray (i.e., Simply Saline) or nasal saline lavage (i.e., NeilMed) is  recommended as needed and prior to medicated nasal sprays.  Gastroesophageal reflux disease, unspecified whether esophagitis present Continue lifestyle and dietary modifications. Continue pantoprazole  40mg  once day - nothing to eat or drink  for 20-30 minutes afterwards.  Continue famotidine  20mg  once a day.   Chronic cough Past history - Followed by pulmonology, GI, ENT and cardiology. Tried Advair, Dulera, Symbicort , azithromycin and flutter valve with no benefit. Currently on PPI for reflux. 2020 CXR no acute disease. 2016 IgE 610 positive to cat, dog, grass, dust mites, trees, ragweed, weed. 09/08/2019 eosinophils 400. 2021 spirometry showed some mild restriction. 2021 skin testing positive to grass and dog. No pets at home.  During respiratory infections/flares:  Start pulmicort  nebulizer twice a day  for 1-2 weeks until your breathing symptoms return to baseline.  Rinse mouth after each use. Pretreat with albuterol  2 puffs or albuterol  nebulizer.  If you need to use your albuterol  nebulizer machine back to back within 15-30 minutes with no relief then please go to the ER/urgent care for further evaluation.  May use albuterol  rescue inhaler 2 puffs or nebulizer every 4 to 6 hours as needed for shortness of breath, chest tightness, coughing, and wheezing. May use albuterol  rescue inhaler 2 puffs 5 to 15 minutes prior to strenuous physical activities. Monitor frequency of use - if you need to use it more than twice per week on a consistent basis let us  know.  Get spirometry at next visit.   Return in about 3 months (around 08/11/2024).  Meds ordered this encounter  Medications   methylPREDNISolone  (MEDROL  DOSEPAK) 4 MG TBPK tablet    Sig: Take 6 tablets on day 1, 5 tablets on day 2, 4 tabs on day 3, 3 tabs on day 4, 2 tabs on day 5, 1 tab on day 6.    Dispense:  21 tablet    Refill:  0   azithromycin (ZITHROMAX Z-PAK) 250 MG tablet    Sig: Take 2 tablets on day 1, then 1 tablet once a day.    Dispense:  6 each    Refill:  0   nystatin (MYCOSTATIN) 100000 UNIT/ML suspension    Sig: Take 5 mLs (500,000 Units total) by mouth 4 (four) times daily. Swish and swallow.    Dispense:  60 mL    Refill:  0   Lab Orders  No  laboratory test(s) ordered today    Diagnostics: None.   Medication List:  Current Outpatient Medications  Medication Sig Dispense Refill   albuterol  (PROVENTIL ) (2.5 MG/3ML) 0.083% nebulizer solution Take 3 mLs (2.5 mg total) by nebulization every 4 (four) hours as needed for wheezing or shortness of breath (coughing fits). 75 mL 1   albuterol  (VENTOLIN  HFA) 108 (90 Base) MCG/ACT inhaler Inhale 2 puffs into the lungs every 4 (four) hours as needed for wheezing or shortness of breath (coughing fits). 18 g 1   alfuzosin (UROXATRAL) 10 MG 24 hr tablet Take 10 mg by mouth daily with breakfast.     Ascorbic Acid  (VITAMIN C WITH ROSE HIPS) 500 MG tablet Take 1,000 mg by mouth daily.     aspirin  EC 81 MG tablet Take 81 mg by mouth daily. Swallow whole.     azithromycin (ZITHROMAX Z-PAK) 250 MG tablet Take 2 tablets on day 1, then 1 tablet once a day. 6 each 0   cloNIDine  (CATAPRES ) 0.1 MG tablet Take 0.1 mg by mouth daily.     clopidogrel  (PLAVIX ) 75 MG tablet Take 1 tablet (75  mg total) by mouth at bedtime. Restart on 04/27/22     co-enzyme Q-10 30 MG capsule Take 30 mg by mouth 3 (three) times daily.     diclofenac Sodium (VOLTAREN) 1 % GEL Apply 2 g topically 4 (four) times daily.     ezetimibe  (ZETIA ) 10 MG tablet Take 10 mg by mouth every morning.     famotidine  (PEPCID ) 20 MG tablet Take 20 mg by mouth daily.     gabapentin (NEURONTIN) 100 MG capsule Take 100 mg by mouth at bedtime.     glipiZIDE (GLUCOTROL XL) 2.5 MG 24 hr tablet Take 2.5 mg by mouth.     glucosamine-chondroitin 500-400 MG tablet Take 1 tablet by mouth every evening.     hydrOXYzine (ATARAX) 25 MG tablet Take 25 mg by mouth 3 (three) times daily as needed.     isosorbide  mononitrate (IMDUR ) 60 MG 24 hr tablet Take 60 mg by mouth at bedtime.     loratadine (CLARITIN) 10 MG tablet Take 10 mg by mouth daily.     losartan  (COZAAR ) 100 MG tablet Take 100 mg by mouth daily.     meclizine (ANTIVERT) 25 MG tablet Take 25 mg by  mouth 2 (two) times daily.     methylPREDNISolone  (MEDROL  DOSEPAK) 4 MG TBPK tablet Take 6 tablets on day 1, 5 tablets on day 2, 4 tabs on day 3, 3 tabs on day 4, 2 tabs on day 5, 1 tab on day 6. 21 tablet 0   metoprolol  tartrate (LOPRESSOR ) 25 MG tablet Take 12.5 mg by mouth 2 (two) times daily.     montelukast  (SINGULAIR ) 10 MG tablet Take 1 tablet (10 mg total) by mouth at bedtime. 30 tablet 11   nitroGLYCERIN (NITROSTAT) 0.4 MG SL tablet Place 0.4 mg under the tongue.     nystatin (MYCOSTATIN) 100000 UNIT/ML suspension Take 5 mLs (500,000 Units total) by mouth 4 (four) times daily. Swish and swallow. 60 mL 0   pantoprazole  (PROTONIX ) 40 MG tablet Take 1 tablet (40 mg total) by mouth 2 (two) times daily before a meal. 180 tablet 3   POTASSIUM PO Take 99 mg by mouth daily.     pravastatin  (PRAVACHOL ) 20 MG tablet Take 20 mg by mouth daily.     ranolazine (RANEXA) 500 MG 12 hr tablet Take 500 mg by mouth 2 (two) times daily.     sertraline  (ZOLOFT ) 50 MG tablet Take 50 mg by mouth daily.     tamsulosin  (FLOMAX ) 0.4 MG CAPS capsule Take 2 capsules (0.8 mg total) by mouth daily. 60 capsule 11   vitamin B-12 (CYANOCOBALAMIN ) 1000 MCG tablet Take 1,000 mcg by mouth daily.     budesonide  (PULMICORT ) 0.5 MG/2ML nebulizer solution Take 2 mLs (0.5 mg total) by nebulization 2 (two) times daily for 14 days. 56 mL 0   No current facility-administered medications for this visit.   Allergies: Allergies  Allergen Reactions   Depakote [Divalproex Sodium]     Hair loss    Statins Other (See Comments)    Muscle cramps   Magnesium  Rash    Liquid    I reviewed his past medical history, social history, family history, and environmental history and no significant changes have been reported from his previous visit.  Review of Systems  Constitutional:  Negative for appetite change, chills, fever and unexpected weight change.  HENT:  Positive for congestion, rhinorrhea and sneezing.   Eyes:  Negative for  itching.  Respiratory:  Positive for cough,  chest tightness, shortness of breath and wheezing.   Cardiovascular:  Negative for chest pain.  Gastrointestinal:  Negative for abdominal pain.  Genitourinary:  Negative for difficulty urinating.  Skin:  Negative for rash.  Allergic/Immunologic: Positive for environmental allergies.  Neurological:  Negative for headaches.    Objective: BP 110/60   Pulse 78   Temp 97.9 F (36.6 C)   Resp 20   SpO2 94%  There is no height or weight on file to calculate BMI. Physical Exam Vitals and nursing note reviewed.  Constitutional:      Appearance: Normal appearance. He is well-developed.  HENT:     Head: Normocephalic and atraumatic.     Right Ear: Tympanic membrane and external ear normal.     Left Ear: Tympanic membrane and external ear normal.     Nose: Nose normal.     Mouth/Throat:     Mouth: Mucous membranes are moist.     Pharynx: Oropharynx is clear.  Eyes:     Conjunctiva/sclera: Conjunctivae normal.  Cardiovascular:     Rate and Rhythm: Normal rate and regular rhythm.     Heart sounds: Normal heart sounds. No murmur heard.    No friction rub. No gallop.  Pulmonary:     Effort: Pulmonary effort is normal.     Breath sounds: Wheezing (slight wheezing on lower lobes) present. No rhonchi.  Musculoskeletal:     Cervical back: Neck supple.  Skin:    General: Skin is warm.     Findings: No rash.  Neurological:     Mental Status: He is alert and oriented to person, place, and time.  Psychiatric:        Behavior: Behavior normal.    Previous notes and tests were reviewed. The plan was reviewed with the patient/family, and all questions/concerned were addressed.  It was my pleasure to see Angel Khan today and participate in his care. Please feel free to contact me with any questions or concerns.  Sincerely,  Eudelia Hero, DO Allergy  & Immunology  [facility]  [place] office: [phone] [facility]  office: [phone]

## 2024-05-11 NOTE — Patient Instructions (Addendum)
 Respiratory infection Start medrol  pack Start Zpak.  Thrush? Use nystatin swish and swallow 5mL four times a day.   Breathing During respiratory infections/flares:  Start pulmicort  nebulizer twice a day  for 1-2 weeks until your breathing symptoms return to baseline.  Rinse mouth after each use. Pretreat with albuterol  2 puffs or albuterol  nebulizer.  If you need to use your albuterol  nebulizer machine back to back within 15-30 minutes with no relief then please go to the ER/urgent care for further evaluation.  May use albuterol  rescue inhaler 2 puffs or nebulizer every 4 to 6 hours as needed for shortness of breath, chest tightness, coughing, and wheezing. May use albuterol  rescue inhaler 2 puffs 5 to 15 minutes prior to strenuous physical activities. Monitor frequency of use - if you need to use it more than twice per week on a consistent basis let us  know.  Breathing control goals:  Full participation in all desired activities (may need albuterol  before activity) Albuterol  use two times or less a week on average (not counting use with activity) Cough interfering with sleep two times or less a month Oral steroids no more than once a year No hospitalizations  Reflux Continue lifestyle and dietary modifications. Continue pantoprazole  40mg  once day - nothing to eat or drink for 20-30 minutes afterwards.  Continue famotidine  20mg  once a day.  Environmental allergies Use over the counter antihistamines such as Zyrtec (cetirizine), Claritin (loratadine), Allegra (fexofenadine), or Xyzal (levocetirizine) daily as needed. May switch antihistamines every few months. Continue Singulair  (montelukast ) 10mg  daily at night. Use Flonase (fluticasone) nasal spray 1-2 sprays per nostril once a day as needed for nasal congestion.  Nasal saline spray (i.e., Simply Saline) or nasal saline lavage (i.e., NeilMed) is recommended as needed and prior to medicated nasal sprays.  Follow up in 3 moths or  sooner if needed.   Drink plenty of fluids. Water, juice, clear broth or warm lemon water are good choices. Avoid caffeine and alcohol, which can dehydrate you. Eat chicken soup. Chicken soup and other warm fluids can be soothing and loosen congestion. Rest. Adjust your room's temperature and humidity. Keep your room warm but not overheated. If the air is dry, a cool-mist humidifier or vaporizer can moisten the air and help ease congestion and coughing. Keep the humidifier clean to prevent the growth of bacteria and molds. Soothe your throat. Perform a saltwater gargle. Dissolve one-quarter to a half teaspoon of salt in a 4- to 8-ounce glass of warm water. This can relieve a sore or scratchy throat temporarily. Use saline nasal drops. To help relieve nasal congestion, try saline nasal drops. You can buy these drops over the counter, and they can help relieve symptoms ? even in children. Take over-the-counter cold and cough medications. For adults and children older than 5, over-the-counter decongestants, antihistamines and pain relievers might offer some symptom relief. However, they won't prevent a cold or shorten its duration.

## 2024-06-03 ENCOUNTER — Ambulatory Visit: Payer: Medicare Other | Admitting: Urology

## 2024-06-03 NOTE — Progress Notes (Deleted)
 Assessment: 1. Lower urinary tract symptoms (LUTS)     Plan: Continue tamsulosin  0.8 mg daily.  Chief Complaint: No chief complaint on file.   HPI: Angel Khan is a 83 y.o. male who presents for continued evaluation of lower urinary tract symptoms. He was seen by Dr. Del Favia in February 2025 for evaluation of testicular pain.  Brief history from his visit on 02/04/2024 as follows: Patient recently presented to the urgent care center complaining of a number of complaints including LEFT sided scrotal discomfort that had apparently been present for about a month.  Patient underwent scrotal ultrasound which demonstrated normal testicles bilaterally.  There were incidental findings of bilateral small epididymal cyst as well as hydroceles.   Patient also reports significant lower urinary tract symptoms.  He took tamsulosin  for a few weeks and was subsequently switched to Uroxatrol.  He noted only mild benefit.   On exam today- patient has obvious tender but reducible large left inguinal hernia.  This is where he reports pain and discomfort not testes. DRE reveals a 30 to 40 g benign feeling prostate PSA 08/2023= 1.2  His dose of tamsulosin  was increased to 0.8 mg daily.  He was referred to general surgery for the inguinal hernia but canceled his appointment.  Portions of the above documentation were copied from a prior visit for review purposes only.  Allergies: Allergies  Allergen Reactions   Depakote [Divalproex Sodium]     Hair loss    Statins Other (See Comments)    Muscle cramps   Magnesium  Rash    Liquid     PMH: Past Medical History:  Diagnosis Date   Adenomatous colon polyp    Barrett esophagus    CAD (coronary artery disease)    Chronic headaches    Complication of anesthesia    Hard to wake up   COPD (chronic obstructive pulmonary disease) (HCC)    Coronary atherosclerosis    Diverticulitis of colon 11/2011   Diagnosed by CT scan [facility]    Dyspnea    GERD (gastroesophageal reflux disease)    Hemorrhoids    History of kidney stones    HLD (hyperlipidemia)    Horseshoe kidney    HTN (hypertension)    Melanoma (HCC)    Poor short term memory    Pre-diabetes    Reactive airway disease 04/20/2024   Sleep apnea    Can not use a cpap   Tracheobronchomalacia    Urticaria     PSH: Past Surgical History:  Procedure Laterality Date   ADENOIDECTOMY     BIOPSY  05/07/2021   Procedure: BIOPSY;  Surgeon: Kenney Peacemaker, MD;  Location: [facility];  Service: Endoscopy;;   BRAIN SURGERY     Aneurysm 2004   CARDIAC CATHETERIZATION  01/01/2013   CHOLECYSTECTOMY  01/12/2013   Dr. Jerri Morale   COLONOSCOPY W/ BIOPSIES AND POLYPECTOMY  09/08/2007   adenomatous polyps, diverticulosis, external hemorrhoids   COLONOSCOPY WITH PROPOFOL  N/A 05/07/2021   Procedure: COLONOSCOPY WITH PROPOFOL ;  Surgeon: Kenney Peacemaker, MD;  Location: [facility];  Service: Endoscopy;  Laterality: N/A;   CORONARY ARTERY BYPASS GRAFT  07/11/2009   off-pump CABG: LIMA-LAD 07/11/09; occluded graft 09/07/09, s/p DES LAD just after DIAG   CORONARY STENT PLACEMENT     x 5   ENDARTERECTOMY Left 03/14/2021   Procedure: LEFT CAROTID ARTERY ENDARTERECTOMY;  Surgeon: Margherita Shell, MD;  Location: [facility] OR;  Service: Vascular;  Laterality: Left;   ESOPHAGOGASTRODUODENOSCOPY (  EGD) WITH PROPOFOL  N/A 05/07/2021   Procedure: ESOPHAGOGASTRODUODENOSCOPY (EGD) WITH PROPOFOL ;  Surgeon: Kenney Peacemaker, MD;  Location: [facility];  Service: Endoscopy;  Laterality: N/A;   HERNIA REPAIR     abdominal   LEFT CAROTID ARTERY ENDARTERECTOMY   03/14/2021   POLYPECTOMY  05/07/2021   Procedure: POLYPECTOMY;  Surgeon: Kenney Peacemaker, MD;  Location: [facility];  Service: Endoscopy;;   SINOSCOPY     TONSILLECTOMY     TRANSFORAMINAL LUMBAR INTERBODY FUSION W/ MIS 2 LEVEL N/A 04/22/2022   Procedure: Lumbar Four-Five, Lumbar Five-Sacral One Minimally Invasive Laminectomies  with Transforaminal Lumbar Interbody Fusion;  Surgeon: Cannon Champion, MD;  Location: [facility] OR;  Service: Neurosurgery;  Laterality: N/A;   UMBILICAL HERNIA REPAIR     UPPER GASTROINTESTINAL ENDOSCOPY  10/12/2010   barrett's, fondic gland polyps, duodenitis    SH: Social History   Tobacco Use   Smoking status: Former    Current packs/day: 0.00    Types: Cigarettes    Quit date: 07/30/1976    Years since quitting: 47.8   Smokeless tobacco: Never  Vaping Use   Vaping status: Never Used  Substance Use Topics   Alcohol use: No   Drug use: No    ROS: Constitutional:  Negative for fever, chills, weight loss CV: Negative for chest pain, previous MI, hypertension Respiratory:  Negative for shortness of breath, wheezing, sleep apnea, frequent cough GI:  Negative for nausea, vomiting, bloody stool, GERD  PE: There were no vitals taken for this visit. GENERAL APPEARANCE:  Well appearing, well developed, well nourished, NAD HEENT:  Atraumatic, normocephalic, oropharynx clear NECK:  Supple without lymphadenopathy or thyromegaly ABDOMEN:  Soft, non-tender, no masses EXTREMITIES:  Moves all extremities well, without clubbing, cyanosis, or edema NEUROLOGIC:  Alert and oriented x 3, normal gait, CN II-XII grossly intact MENTAL STATUS:  appropriate BACK:  Non-tender to palpation, No CVAT SKIN:  Warm, dry, and intact   Results: U/A:  PVR =

## 2024-06-08 ENCOUNTER — Ambulatory Visit
Admission: EM | Admit: 2024-06-08 | Discharge: 2024-06-08 | Disposition: A | Discharge status: Home / Self Care | Attending: Family Medicine | Admitting: Family Medicine

## 2024-06-08 ENCOUNTER — Ambulatory Visit

## 2024-06-08 DIAGNOSIS — R062 Wheezing: Secondary | ICD-10-CM | POA: Diagnosis not present

## 2024-06-08 DIAGNOSIS — R059 Cough, unspecified: Secondary | ICD-10-CM

## 2024-06-08 DIAGNOSIS — J069 Acute upper respiratory infection, unspecified: Secondary | ICD-10-CM | POA: Diagnosis not present

## 2024-06-08 MED ORDER — PREDNISONE 20 MG PO TABS: ORAL_TABLET | ORAL | 0 refills | Status: DC

## 2024-06-08 MED ORDER — HYDROCODONE BIT-HOMATROP MBR 5-1.5 MG/5ML PO SOLN: 5.0000 mL | Freq: Four times a day (QID) | ORAL | 0 refills | Status: DC | PRN

## 2024-06-08 MED ORDER — IPRATROPIUM-ALBUTEROL 0.5-2.5 (3) MG/3ML IN SOLN
3.0000 mL | RESPIRATORY_TRACT | Status: AC
  Administered 2024-06-08: 3 mL via RESPIRATORY_TRACT

## 2024-06-08 MED ORDER — METHYLPREDNISOLONE SODIUM SUCC 125 MG IJ SOLR
125.0000 mg | Freq: Once | INTRAMUSCULAR | Status: AC
  Administered 2024-06-08: 125 mg via INTRAMUSCULAR

## 2024-06-08 MED ORDER — AMOXICILLIN-POT CLAVULANATE 875-125 MG PO TABS: 1.0000 | ORAL_TABLET | Freq: Two times a day (BID) | ORAL | 0 refills | Status: DC

## 2024-06-08 NOTE — Discharge Instructions (Addendum)
 Advised patient chest x-ray was normal with no active cardiopulmonary process.  Advised patient to take medications as directed with food to completion.  Advised patient to take prednisone  with first dose of Augmentin  for the next 5 of 7 days.  Advised may use Hycodan cough syrup at night prior to sleep for cough due to sedative effects.  Encouraged to increase daily water intake to 64 ounces per day while taking these medications.  Advised if symptoms worsen and/or unresolved please follow-up with your PCP or here for further evaluation.

## 2024-06-08 NOTE — ED Provider Notes (Signed)
 [facility]    CSN: [id number] Arrival date & time: 06/08/24  1654      History   Chief Complaint Chief Complaint  Patient presents with   URI    HPI Angel Khan is a 83 y.o. male.   HPI 83 year old male presents with cough and congestion for 1 month.  Past Medical History:  Diagnosis Date   Adenomatous colon polyp    Barrett esophagus    CAD (coronary artery disease)    Chronic headaches    Complication of anesthesia    Hard to wake up   COPD (chronic obstructive pulmonary disease) (HCC)    Coronary atherosclerosis    Diverticulitis of colon 11/2011   Diagnosed by CT scan [facility]   Dyspnea    GERD (gastroesophageal reflux disease)    Hemorrhoids    History of kidney stones    HLD (hyperlipidemia)    Horseshoe kidney    HTN (hypertension)    Melanoma (HCC)    Poor short term memory    Pre-diabetes    Reactive airway disease 04/20/2024   Sleep apnea    Can not use a cpap   Tracheobronchomalacia    Urticaria     Patient Active Problem List   Diagnosis Date Noted   Reactive airway disease 04/20/2024   Wheeze 04/20/2024   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   Asymptomatic carotid artery stenosis without infarction, right 03/14/2021   Gastroesophageal reflux disease 04/20/2020   OSA (obstructive sleep apnea) 04/04/2020   Seasonal and perennial allergic rhinitis 04/04/2020   DOE (dyspnea on exertion) 08/04/2019   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 Jerri Morale   COLONOSCOPY W/ BIOPSIES AND POLYPECTOMY  09/08/2007   adenomatous polyps, diverticulosis, external hemorrhoids   COLONOSCOPY WITH PROPOFOL  N/A 05/07/2021   Procedure: COLONOSCOPY WITH PROPOFOL ;  Surgeon: Kenney Peacemaker, MD;  Location: [facility];  Service: Endoscopy;  Laterality: N/A;   CORONARY ARTERY BYPASS GRAFT  07/11/2009   off-pump CABG: LIMA-LAD 07/11/09; occluded graft 09/07/09, s/p DES LAD just after DIAG   CORONARY STENT PLACEMENT     x 5   ENDARTERECTOMY Left 03/14/2021   Procedure: LEFT CAROTID ARTERY ENDARTERECTOMY;  Surgeon: Margherita Shell, MD;  Location: [facility];  Service: Vascular;  Laterality: Left;   ESOPHAGOGASTRODUODENOSCOPY (EGD) WITH PROPOFOL  N/A 05/07/2021   Procedure: ESOPHAGOGASTRODUODENOSCOPY (EGD) WITH PROPOFOL ;  Surgeon: Kenney Peacemaker, MD;  Location: [facility];  Service: Endoscopy;  Laterality: N/A;   HERNIA REPAIR     abdominal   LEFT CAROTID ARTERY ENDARTERECTOMY   03/14/2021   POLYPECTOMY  05/07/2021   Procedure: POLYPECTOMY;  Surgeon: Kenney Peacemaker, MD;  Location: [facility];  Service: Endoscopy;;   SINOSCOPY     TONSILLECTOMY     TRANSFORAMINAL LUMBAR INTERBODY FUSION W/ MIS 2 LEVEL N/A 04/22/2022   Procedure: Lumbar Four-Five, Lumbar Five-Sacral One Minimally Invasive Laminectomies with Transforaminal  Lumbar Interbody Fusion;  Surgeon: Cannon Champion, MD;  Location: [facility];  Service: Neurosurgery;  Laterality: N/A;   UMBILICAL HERNIA REPAIR     UPPER GASTROINTESTINAL ENDOSCOPY  10/12/2010   barrett's, fondic gland polyps, duodenitis       Home Medications    Prior to Admission medications   Medication Sig Start Date End Date Taking? Authorizing Provider  amoxicillin -clavulanate (AUGMENTIN ) 875-125 MG tablet Take 1 tablet by mouth every 12 (twelve) hours. 06/08/24  Yes Leonides Ramp, FNP  HYDROcodone  bit-homatropine (HYCODAN)  5-1.5 MG/5ML syrup Take 5 mLs by mouth every 6 (six) hours as needed for cough. 06/08/24  Yes Leonides Ramp, FNP  predniSONE  (DELTASONE ) 20 MG tablet Take 3 tabs PO daily x 5 days. 06/08/24  Yes Leonides Ramp, FNP  albuterol  (PROVENTIL ) (2.5 MG/3ML) 0.083% nebulizer solution Take 3 mLs (2.5 mg total) by nebulization every 4 (four) hours as needed for wheezing or shortness of breath (coughing fits). 02/05/24   Trudy Fusi, DO  albuterol  (VENTOLIN  HFA) 108 (90 Base) MCG/ACT inhaler Inhale 2 puffs into the lungs every 4 (four) hours as needed for wheezing or shortness of breath (coughing fits). 02/05/24   Trudy Fusi, DO  alfuzosin (UROXATRAL) 10 MG 24 hr tablet Take 10 mg by mouth daily with breakfast. 12/15/23 12/14/24  [provider]  Ascorbic Acid  (VITAMIN C WITH ROSE HIPS) 500 MG tablet Take 1,000 mg by mouth daily.    [provider]  aspirin  EC 81 MG tablet Take 81 mg by mouth daily. Swallow whole.    [provider]  budesonide  (PULMICORT ) 0.5 MG/2ML nebulizer solution Take 2 mLs (0.5 mg total) by nebulization 2 (two) times daily for 14 days. 04/20/24 05/04/24  Ardie Kras, FNP  cloNIDine  (CATAPRES ) 0.1 MG tablet Take 0.1 mg by mouth daily.    [provider]  clopidogrel  (PLAVIX ) 75 MG tablet Take 1 tablet (75 mg total) by mouth at bedtime. Restart on 04/27/22 04/23/22   Cannon Champion, MD  co-enzyme Q-10 30 MG capsule Take 30 mg by mouth 3 (three) times daily. 04/30/23   [provider]  diclofenac Sodium (VOLTAREN) 1 % GEL Apply 2 g topically 4 (four) times daily. 01/20/24   [provider]  ezetimibe  (ZETIA ) 10 MG tablet Take 10 mg by mouth every morning.    [provider]  famotidine  (PEPCID ) 20 MG tablet Take 20 mg by mouth daily.    [provider]  gabapentin (NEURONTIN) 100 MG capsule Take 100 mg by mouth at bedtime.    [provider]  glipiZIDE (GLUCOTROL XL) 2.5 MG 24 hr tablet Take 2.5 mg by mouth. 04/07/24    [provider]  glucosamine-chondroitin 500-400 MG tablet Take 1 tablet by mouth every evening.    [provider]  hydrOXYzine (ATARAX) 25 MG tablet Take 25 mg by mouth 3 (three) times daily as needed. 12/15/23   [provider]  isosorbide  mononitrate (IMDUR ) 60 MG 24 hr tablet Take 60 mg by mouth at bedtime.    [provider]  loratadine (CLARITIN) 10 MG tablet Take 10 mg by mouth daily. 09/22/17   [provider]  losartan  (COZAAR ) 100 MG tablet Take 100 mg by mouth daily.    [provider]  meclizine (ANTIVERT) 25 MG tablet Take 25 mg by mouth 2 (two) times daily. 06/12/23 06/11/24  [provider]  metoprolol  tartrate (LOPRESSOR ) 25 MG tablet Take 12.5 mg by mouth 2 (two) times daily.  03/13/24   [provider]  montelukast  (SINGULAIR ) 10 MG tablet Take 1 tablet (10 mg total) by mouth at bedtime. 07/01/19   Diamond Formica, MD  nitroGLYCERIN (NITROSTAT) 0.4 MG SL tablet Place 0.4 mg under the tongue. 03/29/24   [provider]  nystatin  (MYCOSTATIN ) 100000 UNIT/ML suspension Take 5 mLs (500,000 Units total) by mouth 4 (four) times daily. Swish and swallow. 05/11/24   Trudy Fusi, DO  pantoprazole  (PROTONIX ) 40 MG tablet Take 1 tablet (40 mg total) by mouth 2 (two) times daily before a meal. 03/29/24   Kenney Peacemaker, MD  POTASSIUM PO Take 99 mg by mouth daily.    [provider]  pravastatin  (PRAVACHOL ) 20 MG tablet Take 20 mg by mouth daily.    [provider]  ranolazine (RANEXA) 500 MG 12 hr tablet Take 500 mg by mouth 2 (two) times daily. 03/23/24   [provider]  sertraline  (ZOLOFT ) 50 MG tablet Take 50 mg by mouth daily. 05/08/20   [provider]  tamsulosin  (FLOMAX ) 0.4 MG CAPS capsule Take 2 capsules (0.8 mg total) by mouth daily. 02/04/24   Scarlet Curly, MD  vitamin B-12 (CYANOCOBALAMIN ) 1000 MCG tablet Take 1,000 mcg by mouth daily.    [provider]     Family History Family History  Problem Relation Age of Onset   Diabetes Mother    Heart disease Mother    Heart disease Father    Rheumatic fever Sister    Heart disease Brother    Colitis Other        niece   Colon cancer Neg Hx    Stomach cancer Neg Hx    Pancreatic cancer Neg Hx    Allergic rhinitis Neg Hx    Asthma Neg Hx    Eczema Neg Hx    Urticaria Neg Hx     Social History Social History   Tobacco Use   Smoking status: Former    Current packs/day: 0.00    Types: Cigarettes    Quit date: 07/30/1976    Years since quitting: 47.8   Smokeless tobacco: Never  Vaping Use   Vaping status: Never Used  Substance Use Topics   Alcohol use: No   Drug use: No     Allergies   Depakote [divalproex sodium], Statins, and Magnesium    Review of Systems Review of Systems  HENT:  Positive for congestion.   Respiratory:  Positive for cough.   All other systems reviewed and are negative.    Physical Exam Triage Vital Signs ED Triage Vitals  Encounter Vitals Group     BP 06/08/24 1748 (!) 147/82     Systolic BP Percentile --      Diastolic BP Percentile --      Pulse Rate 06/08/24 1747 73     Resp 06/08/24 1747 19     Temp 06/08/24 1747 98.7 F (37.1 C)     Temp src --      SpO2 06/08/24 1747 98 %     Weight --      Height --      Head Circumference --      Peak Flow --      Pain Score 06/08/24 1745 0     Pain Loc --      Pain Education --      Exclude from Growth Chart --    No data found.  Updated Vital Signs BP (!) 147/82   Pulse 73  Temp 98.7 F (37.1 C)   Resp (!) 24   SpO2 98%    Physical Exam Vitals and nursing note reviewed.  Constitutional:      Appearance: Normal appearance. He is normal weight.  HENT:     Head: Normocephalic and atraumatic.     Right Ear: Tympanic membrane, ear canal and external ear normal.     Left Ear: Tympanic membrane, ear canal and external ear normal.     Mouth/Throat:     Mouth: Mucous membranes are  moist.     Pharynx: Oropharynx is clear.  Eyes:     Extraocular Movements: Extraocular movements intact.     Conjunctiva/sclera: Conjunctivae normal.     Pupils: Pupils are equal, round, and reactive to light.  Cardiovascular:     Rate and Rhythm: Normal rate and regular rhythm.     Pulses: Normal pulses.     Heart sounds: Normal heart sounds.  Pulmonary:     Effort: Pulmonary effort is normal.     Breath sounds: Normal breath sounds. No wheezing, rhonchi or rales.  Chest:     Chest wall: No tenderness.  Musculoskeletal:        General: Normal range of motion.     Cervical back: Normal range of motion and neck supple.  Skin:    General: Skin is warm and dry.  Neurological:     General: No focal deficit present.     Mental Status: He is alert and oriented to person, place, and time. Mental status is at baseline.  Psychiatric:        Mood and Affect: Mood normal.        Behavior: Behavior normal.      UC Treatments / Results  Labs (all labs ordered are listed, but only abnormal results are displayed) Labs Reviewed - No data to display  EKG   Radiology No results found.  Procedures Procedures (including critical care time)  Medications Ordered in UC Medications  methylPREDNISolone  sodium succinate (SOLU-MEDROL ) 125 mg/2 mL injection 125 mg (125 mg Intramuscular Given 06/08/24 1825)  ipratropium-albuterol  (DUONEB) 0.5-2.5 (3) MG/3ML nebulizer solution 3 mL (3 mLs Nebulization Given 06/08/24 1825)    Initial Impression / Assessment and Plan / UC Course  I have reviewed the triage vital signs and the nursing notes.  Pertinent labs & imaging results that were available during my care of the patient were reviewed by me and considered in my medical decision making (see chart for details).     MDM: 1.  Acute URI-Rx'd Augmentin  875/225 mg tablet: Take 1 tablet twice daily x 7 days; 2.  Cough, unspecified type-chest x-ray results revealed above, Rx'd prednisone  20 mg  tablet: Take 3 tablets p.o. daily x 5 days, Rx'd Hycodan 5-1.5 Mg/5 mL syrup: Take 5 mL every 6 hours as needed for cough.;  3.  Wheezes-IM Solu-Medrol  125 mg given once in clinic DuoNeb nebulizer given once in clinic. Advised patient chest x-ray was normal with no active cardiopulmonary process.  Advised patient to take medications as directed with food to completion.  Advised patient to take prednisone  with first dose of Augmentin  for the next 5 of 7 days.  Advised may use Hycodan cough syrup at night prior to sleep for cough due to sedative effects.  Encouraged to increase daily water intake to 64 ounces per day while taking these medications.  Advised if symptoms worsen and/or unresolved please follow-up with your PCP or here for further evaluation. Final Clinical Impressions(s) / UC  Diagnoses   Final diagnoses:  Cough, unspecified type  Wheezes  Acute URI     Discharge Instructions      Advised patient chest x-ray was normal with no active cardiopulmonary process.  Advised patient to take medications as directed with food to completion.  Advised patient to take prednisone  with first dose of Augmentin  for the next 5 of 7 days.  Advised may use Hycodan cough syrup at night prior to sleep for cough due to sedative effects.  Encouraged to increase daily water intake to 64 ounces per day while taking these medications.  Advised if symptoms worsen and/or unresolved please follow-up with your PCP or here for further evaluation.   ED Prescriptions     Medication Sig Dispense Auth. Provider   amoxicillin -clavulanate (AUGMENTIN ) 875-125 MG tablet Take 1 tablet by mouth every 12 (twelve) hours. 14 tablet Frannie Shedrick, FNP   predniSONE  (DELTASONE ) 20 MG tablet Take 3 tabs PO daily x 5 days. 15 tablet Ahmere Hemenway, FNP   HYDROcodone  bit-homatropine (HYCODAN) 5-1.5 MG/5ML syrup Take 5 mLs by mouth every 6 (six) hours as needed for cough. 120 mL Leonides Ramp, FNP      I have reviewed the PDMP  during this encounter.   Leonides Ramp, FNP 06/08/24 1924

## 2024-06-08 NOTE — ED Triage Notes (Signed)
 Pt presents to uc with uri pt reports cough congestion for one month. Pt has been using some old inhalers and nasal spray.

## 2024-06-10 ENCOUNTER — Ambulatory Visit (HOSPITAL_COMMUNITY): Payer: Self-pay

## 2024-06-10 ENCOUNTER — Telehealth: Payer: Self-pay | Admitting: Allergy

## 2024-06-10 DIAGNOSIS — R062 Wheezing: Secondary | ICD-10-CM

## 2024-06-10 MED ORDER — BUDESONIDE 0.5 MG/2ML IN SUSP
RESPIRATORY_TRACT | 1 refills | Status: DC
Start: 2024-06-10 — End: 2024-06-11

## 2024-06-10 NOTE — Telephone Encounter (Signed)
 I called the patient's pharmacy. They did tell me that the Albuterol  inhaler- Brand Ventolin  is covered by the insurance but he has a copay of $55. They did tell me they could run the generic without insurance and it would be $28.23. I informed the budesonide  neb solution has been resent as the pharmacy told me the rx had expired. I called the patient and informed. The patient told me that his insurance needs us  to call them as his medications aren't being covered and he usually gets his prescriptions anywhere from $0-5. He provided me with the insurance number: [phone]. I informed the patient I would call them to see what we can figure out.

## 2024-06-10 NOTE — Telephone Encounter (Signed)
 Patient called and stated that insurance is not paying for his budesonide  and albuterol  inhalers. Patients pharmacy is [facility] in [place]. Patients call back number is [phone]

## 2024-06-11 MED ORDER — ALBUTEROL SULFATE HFA 108 (90 BASE) MCG/ACT IN AERS: 2.0000 | INHALATION_SPRAY | RESPIRATORY_TRACT | 1 refills | Status: AC | PRN

## 2024-06-11 MED ORDER — BUDESONIDE 0.5 MG/2ML IN SUSP: RESPIRATORY_TRACT | 1 refills | Status: DC

## 2024-06-11 MED ORDER — ALBUTEROL SULFATE (2.5 MG/3ML) 0.083% IN NEBU: 2.5000 mg | INHALATION_SOLUTION | RESPIRATORY_TRACT | 1 refills | Status: AC | PRN

## 2024-06-11 NOTE — Telephone Encounter (Signed)
 I called [employer] and they did inform me that the Albuterol  is covered under Part B. Medications have been resent to be filed under Part B. They informed me that the Budesonide  is still pending. Medications have been resent. I called the patient and left a detailed message informing.

## 2024-06-11 NOTE — Addendum Note (Signed)
 Addended by: Corinda Dibbles on: 06/11/2024 02:26 PM   Modules accepted: Orders

## 2024-06-15 NOTE — Telephone Encounter (Signed)
 Please call patient and let him know that his insurance DENIED the budesonide  nebulizer vial medication.  He can use goodrx instead and it looks like would cost him between $50-$65 depending which pharmacy he goes to.   Otherwise, I can try to give him an HFA inhaler to use such as Flovent or Asmanex which you don't use a nebulizer machine with.  Let us  know what he prefers.

## 2024-06-16 MED ORDER — FLUTICASONE PROPIONATE HFA 110 MCG/ACT IN AERO: 2.0000 | INHALATION_SPRAY | Freq: Two times a day (BID) | RESPIRATORY_TRACT | 3 refills | Status: AC

## 2024-06-16 NOTE — Addendum Note (Signed)
 Addended by: Trudy Fusi on: 06/16/2024 02:57 PM   Modules accepted: Orders

## 2024-06-16 NOTE — Telephone Encounter (Signed)
 Called patient - DOB/NEED DPR verified- advised of provider notation below.  Patient stated Albuterol (Ventolin ) HFA inhaler has already been sent to pharmacy - if provider want him to use something else - Flovent or Asmanex would be fine.  Forwarding updated message to provider.

## 2024-06-16 NOTE — Telephone Encounter (Signed)
Sent in Flovent.

## 2024-06-24 ENCOUNTER — Other Ambulatory Visit (HOSPITAL_COMMUNITY): Payer: Self-pay

## 2024-06-24 ENCOUNTER — Telehealth: Payer: Self-pay

## 2024-06-24 NOTE — Telephone Encounter (Signed)
 Please do PA due to history of frequent thrush.  Thank you.

## 2024-06-24 NOTE — Telephone Encounter (Signed)
PA has now been approved

## 2024-06-24 NOTE — Telephone Encounter (Signed)
*  AA  Pharmacy Patient Advocate Encounter   Received notification from CoverMyMeds that prior authorization for Fluticasone Propionate HFA 110MCG/ACT aerosol  is required/requested.   Insurance verification completed.   The patient is insured through [employer] .   Per test claim: PA required; PA submitted to above mentioned insurance via CoverMyMeds Key/confirmation #/EOC [id number] Status is pending

## 2024-06-24 NOTE — Telephone Encounter (Signed)
 PA Case: [id number], Status: Approved, Coverage Starts on: 12/31/2023 12:00:00 AM, Coverage Ends on: 12/29/2024 12:00:00 AM. Questions? Contact [phone].

## 2024-06-28 ENCOUNTER — Ambulatory Visit

## 2024-06-28 ENCOUNTER — Other Ambulatory Visit: Payer: Self-pay

## 2024-06-28 ENCOUNTER — Ambulatory Visit
Admission: EM | Admit: 2024-06-28 | Discharge: 2024-06-28 | Disposition: A | Discharge status: Home / Self Care | Attending: Family Medicine | Admitting: Family Medicine

## 2024-06-28 DIAGNOSIS — G44201 Tension-type headache, unspecified, intractable: Secondary | ICD-10-CM

## 2024-06-28 DIAGNOSIS — R0602 Shortness of breath: Secondary | ICD-10-CM

## 2024-06-28 DIAGNOSIS — J3489 Other specified disorders of nose and nasal sinuses: Secondary | ICD-10-CM | POA: Diagnosis not present

## 2024-06-28 MED ORDER — BUTALBITAL-APAP-CAFFEINE 50-325-40 MG PO TABS: 1.0000 | ORAL_TABLET | Freq: Four times a day (QID) | ORAL | 0 refills | Status: AC | PRN

## 2024-06-28 MED ORDER — BACLOFEN 10 MG PO TABS
10.0000 mg | ORAL_TABLET | Freq: Every evening | ORAL | 0 refills | Status: AC
Start: 2024-06-28 — End: ?

## 2024-06-28 NOTE — ED Provider Notes (Signed)
 [facility]    CSN: [id number] Arrival date & time: 06/28/24  1301      History   Chief Complaint Chief Complaint  Patient presents with   Headache    HPI Angel Khan is a 83 y.o. male.   Pleasant 83 year old man known to me from prior visits.  He is here today with a complaint of headache.  He states he has had it off and on for some time.  He is here because it has been worse lately.  He does have a history of chronic headaches.  He also has a history of surgery for brain aneurysm, many years ago.  He has chronic allergies and sinus congestion.  He complains of pain in the back of his head and pain across his forehead.  Gets worse if he leans over.  Denies runny or stuffy nose.  Denies sinus drainage.  No visual symptoms.  No nausea and vomiting.  No head injury. In addition he has chronic shortness of breath.  He thinks it is worse lately.  He had a cardiology workup including catheterization and stent placement.  He states he has been reassured by his cardiologist that he is doing well.  He does not feel well.  He states he is tired appearing this would like to see another cardiologist for an opinion.  He request that I place the referral for him.  I told him that I will put the paperwork in, but he needs to discuss this with his primary care doctor.    Past Medical History:  Diagnosis Date   Adenomatous colon polyp    Barrett esophagus    CAD (coronary artery disease)    Chronic headaches    Complication of anesthesia    Hard to wake up   COPD (chronic obstructive pulmonary disease) (HCC)    Coronary atherosclerosis    Diverticulitis of colon 11/2011   Diagnosed by CT scan [facility]   Dyspnea    GERD (gastroesophageal reflux disease)    Hemorrhoids    History of kidney stones    HLD (hyperlipidemia)    Horseshoe kidney    HTN (hypertension)    Melanoma (HCC)    Poor short term memory    Pre-diabetes    Reactive airway disease  04/20/2024   Sleep apnea    Can not use a cpap   Tracheobronchomalacia    Urticaria     Patient Active Problem List   Diagnosis Date Noted   Reactive airway disease 04/20/2024   Wheeze 04/20/2024   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   Asymptomatic carotid artery stenosis without infarction, right 03/14/2021   Gastroesophageal reflux disease 04/20/2020   OSA (obstructive sleep apnea) 04/04/2020   Seasonal and perennial allergic rhinitis 04/04/2020   DOE (dyspnea on exertion) 08/04/2019   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 Prentice El   COLONOSCOPY W/ BIOPSIES AND POLYPECTOMY  09/08/2007   adenomatous polyps, diverticulosis, external hemorrhoids   COLONOSCOPY WITH PROPOFOL  N/A 05/07/2021   Procedure: COLONOSCOPY WITH PROPOFOL ;  Surgeon: Avram Lupita BRAVO, MD;  Location: [facility];  Service: Endoscopy;  Laterality: N/A;   CORONARY ARTERY BYPASS GRAFT  07/11/2009   off-pump CABG: LIMA-LAD 07/11/09; occluded graft 09/07/09, s/p DES LAD just after DIAG   CORONARY STENT PLACEMENT     x 5   ENDARTERECTOMY Left 03/14/2021   Procedure: LEFT CAROTID ARTERY ENDARTERECTOMY;  Surgeon: Serene Gaile ORN, MD;  Location: [facility] OR;  Service: Vascular;  Laterality: Left;   ESOPHAGOGASTRODUODENOSCOPY (EGD) WITH PROPOFOL  N/A 05/07/2021   Procedure: ESOPHAGOGASTRODUODENOSCOPY (EGD) WITH PROPOFOL ;  Surgeon: Avram Lupita BRAVO, MD;  Location: [facility]  ENDOS[facility];  Service: Endoscopy;  Laterality: N/A;   HERNIA REPAIR     abdominal   LEFT CAROTID ARTERY ENDARTERECTOMY   03/14/2021   POLYPECTOMY  05/07/2021   Procedure: POLYPECTOMY;  Surgeon: Avram Lupita BRAVO, MD;  Location: [facility];  Service: Endoscopy;;   SINOSCOPY     TONSILLECTOMY     TRANSFORAMINAL LUMBAR INTERBODY FUSION W/ MIS 2 LEVEL N/A 04/22/2022   Procedure: Lumbar Four-Five, Lumbar Five-Sacral One Minimally Invasive Laminectomies with Transforaminal Lumbar Interbody Fusion;  Surgeon: Cheryle Debby LABOR, MD;  Location: [facility] OR;  Service: Neurosurgery;  Laterality: N/A;   UMBILICAL HERNIA REPAIR     UPPER GASTROINTESTINAL ENDOSCOPY  10/12/2010   barrett's, fondic gland polyps, duodenitis       Home Medications    Prior to Admission medications   Medication Sig Start Date End Date Taking? Authorizing Provider  baclofen  (LIORESAL ) 10 MG tablet Take 1 tablet (10 mg total) by mouth at bedtime. 06/28/24  Yes Maranda Jamee Jacob, MD  butalbital-acetaminophen -caffeine (FIORICET) 50-325-40 MG tablet Take 1 tablet by mouth every 6 (six) hours as needed for headache. 06/28/24 06/28/25 Yes Maranda Jamee Jacob, MD  albuterol  (PROVENTIL ) (2.5 MG/3ML) 0.083% nebulizer solution Take 3 mLs (2.5 mg total) by nebulization every 4 (four) hours as needed for wheezing or shortness of breath (coughing fits). 06/11/24   Luke Orlan HERO, DO  albuterol  (VENTOLIN  HFA) 108 (90 Base) MCG/ACT inhaler Inhale 2 puffs into the lungs every 4 (four) hours as needed for wheezing or shortness of breath (coughing fits). 06/11/24   Luke Orlan HERO, DO  alfuzosin (UROXATRAL) 10 MG 24 hr tablet Take 10 mg by mouth daily with breakfast. 12/15/23 12/14/24  [provider]  Ascorbic Acid  (VITAMIN C WITH ROSE HIPS) 500 MG tablet Take 1,000 mg by mouth daily.    [provider]  aspirin  EC 81 MG tablet Take 81 mg by mouth daily. Swallow whole.    [provider]  cloNIDine  (CATAPRES ) 0.1 MG tablet Take 0.1  mg by mouth daily.    [provider]  clopidogrel  (PLAVIX ) 75 MG tablet Take 1 tablet (75 mg total) by mouth at bedtime. Restart on 04/27/22 04/23/22   Cheryle Debby LABOR, MD  co-enzyme Q-10 30 MG capsule Take 30 mg by mouth 3 (three) times daily. 04/30/23   [provider]  diclofenac Sodium (VOLTAREN) 1 % GEL Apply 2 g topically 4 (four) times daily. 01/20/24   [provider]  ezetimibe  (ZETIA ) 10 MG tablet Take 10 mg by mouth every morning.    [provider]  famotidine  (PEPCID ) 20 MG tablet Take 20 mg by mouth daily.    [provider]  fluticasone  (FLOVENT  HFA) 110 MCG/ACT inhaler Inhale 2 puffs into the lungs in the morning and  at bedtime. Use for 1-2 weeks at a time during asthma flare. Use with spacer and rinse mouth after each use. 06/16/24   Luke Orlan HERO, DO  gabapentin (NEURONTIN) 100 MG capsule Take 100 mg by mouth at bedtime.    [provider]  glipiZIDE (GLUCOTROL XL) 2.5 MG 24 hr tablet Take 2.5 mg by mouth. 04/07/24   [provider]  glucosamine-chondroitin 500-400 MG tablet Take 1 tablet by mouth every evening.    [provider]  hydrOXYzine (ATARAX) 25 MG tablet Take 25 mg by mouth 3 (three) times daily as needed. 12/15/23   [provider]  isosorbide  mononitrate (IMDUR ) 60 MG 24 hr tablet Take 60 mg by mouth at bedtime.    [provider]  loratadine (CLARITIN) 10 MG tablet Take 10 mg by mouth daily. 09/22/17   [provider]  losartan  (COZAAR ) 100 MG tablet Take 100 mg by mouth daily.    [provider]  metoprolol  tartrate (LOPRESSOR ) 25 MG tablet Take 12.5 mg by mouth 2 (two) times daily. 03/13/24   [provider]  montelukast  (SINGULAIR ) 10 MG tablet Take 1 tablet (10 mg total) by mouth at bedtime. 07/01/19   Darlean Ozell NOVAK, MD  nitroGLYCERIN (NITROSTAT) 0.4 MG SL tablet Place 0.4 mg under the tongue. 03/29/24   [provider]  pantoprazole  (PROTONIX ) 40  MG tablet Take 1 tablet (40 mg total) by mouth 2 (two) times daily before a meal. 03/29/24   Avram Lupita BRAVO, MD  POTASSIUM PO Take 99 mg by mouth daily.    [provider]  pravastatin  (PRAVACHOL ) 20 MG tablet Take 20 mg by mouth daily.    [provider]  ranolazine (RANEXA) 500 MG 12 hr tablet Take 500 mg by mouth 2 (two) times daily. 03/23/24   [provider]  sertraline  (ZOLOFT ) 50 MG tablet Take 50 mg by mouth daily. 05/08/20   [provider]  tamsulosin  (FLOMAX ) 0.4 MG CAPS capsule Take 2 capsules (0.8 mg total) by mouth daily. 02/04/24   Shona Layman BROCKS, MD  vitamin B-12 (CYANOCOBALAMIN ) 1000 MCG tablet Take 1,000 mcg by mouth daily.    [provider]    Family History Family History  Problem Relation Age of Onset   Diabetes Mother    Heart disease Mother    Heart disease Father    Rheumatic fever Sister    Heart disease Brother    Colitis Other        niece   Colon cancer Neg Hx    Stomach cancer Neg Hx    Pancreatic cancer Neg Hx    Allergic rhinitis Neg Hx    Asthma Neg Hx    Eczema Neg Hx    Urticaria Neg Hx     Social History Social History   Tobacco Use   Smoking status: Former    Current packs/day: 0.00    Types: Cigarettes    Quit date: 07/30/1976    Years since quitting: 47.9   Smokeless tobacco: Never  Vaping Use   Vaping status: Never Used  Substance Use Topics   Alcohol use: No   Drug use: No     Allergies   Depakote [divalproex sodium], Statins, and Magnesium    Review of Systems Review of Systems See HPI  Physical Exam Triage Vital Signs ED Triage Vitals  Encounter Vitals Group     BP 06/28/24 1324 131/72     Girls Systolic BP Percentile --  Girls Diastolic BP Percentile --      Boys Systolic BP Percentile --      Boys Diastolic BP Percentile --      Pulse Rate 06/28/24 1324 83     Resp 06/28/24 1324 18     Temp 06/28/24 1324 97.8 F (36.6 C)     Temp src --      SpO2 06/28/24 1324  95 %     Weight --      Height --      Head Circumference --      Peak Flow --      Pain Score 06/28/24 1327 7     Pain Loc --      Pain Education --      Exclude from Growth Chart --    No data found.  Updated Vital Signs BP 131/72   Pulse 83   Temp 97.8 F (36.6 C)   Resp 18   SpO2 95%      Physical Exam Vitals reviewed.  Constitutional:      General: He is not in acute distress.    Appearance: He is well-developed and normal weight. He is not ill-appearing.  HENT:     Head: Normocephalic and atraumatic.     Right Ear: Tympanic membrane normal.     Left Ear: Tympanic membrane normal.     Nose: Septal deviation present.     Right Turbinates: Not swollen.     Left Turbinates: Not swollen.     Right Sinus: Maxillary sinus tenderness and frontal sinus tenderness present.     Left Sinus: Maxillary sinus tenderness and frontal sinus tenderness present.     Mouth/Throat:     Mouth: Mucous membranes are moist.   Eyes:     General: No visual field deficit.    Extraocular Movements: Extraocular movements intact.     Conjunctiva/sclera: Conjunctivae normal.     Pupils: Pupils are equal, round, and reactive to light.    Cardiovascular:     Rate and Rhythm: Normal rate and regular rhythm.  Pulmonary:     Effort: Pulmonary effort is normal. No respiratory distress.     Breath sounds: Normal breath sounds.   Musculoskeletal:        General: Normal range of motion.     Cervical back: Normal range of motion. No rigidity.   Skin:    General: Skin is warm and dry.   Neurological:     Mental Status: He is alert.     Cranial Nerves: No cranial nerve deficit, dysarthria or facial asymmetry.     Gait: Gait normal.     Deep Tendon Reflexes: Reflexes normal.   Psychiatric:        Mood and Affect: Mood normal.      UC Treatments / Results  Labs (all labs ordered are listed, but only abnormal results are displayed) Labs Reviewed - No data to  display  EKG   Radiology DG Sinuses Complete Result Date: 06/28/2024 CLINICAL DATA:  Facial pain.  Frontal sinus pressure. EXAM: PARANASAL SINUSES - COMPLETE 3 + VIEW COMPARISON:  Sinus CT 07/13/2019 FINDINGS: The paranasal sinus are aerated. There is no evidence of sinus opacification air-fluid levels or mucosal thickening. No significant bone abnormalities are seen. Patient is edentulous. Embolization coils noted in the intracranial wall. IMPRESSION: No radiographic evidence of sinus disease. Electronically Signed   By: Andrea Gasman M.D.   On: 06/28/2024 14:16    Procedures Procedures (including critical care time)  Medications Ordered in UC Medications - No data to display  Initial Impression / Assessment and Plan / UC Course  I have reviewed the triage vital signs and the nursing notes.  Pertinent labs & imaging results that were available during my care of the patient were reviewed by me and considered in my medical decision making (see chart for details).     Because of the patient's history of allergies and his tenderness over the sinuses like to get x-rays.  They are negative.  Patient states it does not feel like a sinus infection.  He does state that he is under a lot of stress between himself and his wife illness and concerns.  I explained that muscle tension headache will cause pain at the back of the head in a bandlike pain across the forehead.  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  I think the risk is too high.  I reviewed his chart and he has a history of headaches for which she gets Fioricet.  I will refill this instead, and try muscle relaxer at bedtime.  I have encouraged him to go back to his PCP for follow-up Final Clinical Impressions(s) / UC Diagnoses   Final diagnoses:  Frontal sinus pain  Acute intractable tension-type headache  SOB (shortness of breath) on  exertion     Discharge Instructions      I cannot prescribe anti-inflammatory drugs because of your use of Plavix  and history of bleeding ulcers I am going to refill Fioricet for your headaches.  I see this is in your chart from previous use Take baclofen  at bedtime.  This is a mild muscle relaxer See your primary care doctor if this fails to improve  Referral to cardiology placed at your request   ED Prescriptions     Medication Sig Dispense Auth. Provider   baclofen  (LIORESAL ) 10 MG tablet Take 1 tablet (10 mg total) by mouth at bedtime. 10 each Maranda Jamee Jacob, MD   butalbital-acetaminophen -caffeine (FIORICET) 50-325-40 MG tablet Take 1 tablet by mouth every 6 (six) hours as needed for headache. 20 tablet Maranda Jamee Jacob, MD      PDMP not reviewed this encounter.   Maranda Jamee Jacob, MD 06/28/24 [id number]

## 2024-06-28 NOTE — ED Triage Notes (Addendum)
 I don't have a headache but my head hurts, especially when I bend over. Also has back of head pain. Pain has been going on for over a month. No otc meds. Also wants cardiology referral.

## 2024-06-28 NOTE — Discharge Instructions (Signed)
 I cannot prescribe anti-inflammatory drugs because of your use of Plavix  and history of bleeding ulcers I am going to refill Fioricet for your headaches.  I see this is in your chart from previous use Take baclofen  at bedtime.  This is a mild muscle relaxer See your primary care doctor if this fails to improve  Referral to cardiology placed at your request

## 2024-07-02 NOTE — Progress Notes (Signed)
 [facility] Case Management Discharge Note   Patient:   Angel Khan MR Number:  [id number] Patient Date of Birth: [birth year] Age/Sex:  83 y.o./male   Discharge Assessment   Facility/Agency Type: Home, no needs  Home Health Services/Durable Medical Equipment at Discharge             Discharge Assistance     Discharge Transportation: Private vehicle   The level of care algorithm has been used for appropriate discharge planning.  Patient to return home with caregiver support and back to established providers. Case Management has assessed this patient/family or caregiver's readiness, willingness and ability to provide or support self-management activities when needed after discharge from the acute care setting.  Electronically signed: Melissa D Jacobs, MSW 07/02/2024 1:55 PM

## 2024-07-02 NOTE — Discharge Summary (Signed)
 [facility] HEALTH [facility] Discharge Summary  PCP: Charmaine Heller, NP Discharge Details   Admit date:         06/30/2024 Discharge date:        07/02/2024  Hospital LOS:    1 days DIscharge Disposition: Home  Active Hospital Problems   Diagnosis Date Noted POA  . *Cerebrovascular accident (CVA), unspecified mechanism (*) 06/30/2024 Yes  . Hx of CABG 06/30/2024 Not Applicable  . History of CVA (cerebrovascular accident) 08/11/2023 Not Applicable  . Class 1 obesity due to excess calories with serious comorbidity and body mass index (BMI) of 30.0 to 30.9 in adult 04/30/2023 Not Applicable  . 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 Lumbar degenerative disc disease 05/15/2019 Yes  . OSA (obstructive sleep apnea) 06/22/2012 Yes  . CAD (coronary artery disease) 10/31/2011 Yes  . Mixed hyperlipidemia 11/07/2008 Yes    Resolved Hospital Problems  No resolved problems to display.      Current Discharge Medication List     START taking these medications      Details  ezetimibe  10 MG tablet Commonly known as: ZETIA   TAKE 1 TABLET BY MOUTH ONCE DAILY AFTER BREAKFAST Quantity: 90 tablet       CONTINUE these medications which have CHANGED      Details  albuterol  sulfate HFA 108 (90 Base) MCG/ACT inhaler Commonly known as: PROVENTIL ,VENTOLIN ,PROAIR  What changed: Another medication with the same name was removed. Continue taking this medication, and follow the directions you see here.  Inhale two puffs into the lungs every 4 (four) hours as needed for Wheezing or Shortness of Breath. Quantity: 6.7 g   pravastatin  sodium 20 mg tablet Commonly known as: PRAVACHOL  What changed: how much to take  Take two tablets (40 mg dose) by mouth at bedtime. Quantity: 30 tablet       CONTINUE these medications which have NOT CHANGED      Details  aspirin  EC tablet Commonly known as: ECOTRIN LOW DOSE  Take one tablet (81 mg  dose) by mouth at bedtime.   B-12 1000 MCG Tabs  Take 1,000 mcg by mouth every morning.   baclofen  10 mg tablet Commonly known as: LIORESAL   Take one tablet (10 mg dose) by mouth at bedtime.   butalbital -acetaminophen -caffeine  50-325-40 mg per tablet Commonly known as: ESGIC  Take one tablet by mouth daily as needed for Pain. Max Daily Amount: 1 tablet Quantity: 20 tablet   clopidogrel  bisulfate 75 mg tablet Commonly known as: PLAVIX   Take one tablet (75 mg dose) by mouth at bedtime.   diclofenac sodium 1 % gel Commonly known as: VOLTAREN  Apply two g topically 4 (four) times a day as needed. Quantity: 20 g   famotidine  20 mg tablet Commonly known as: PEPCID   Take 1 tablet by mouth once daily Quantity: 90 tablet   fluticasone  110 mcg/actuation inhaler Commonly known as: FLOVENT  HFA  Inhale two puffs into the lungs 2 (two) times daily.   gabapentin 100 mg capsule Commonly known as: NEURONTIN  Take one capsule (100 mg dose) by mouth at bedtime. Quantity: 90 capsule   glipiZIDE ER 2.5 mg 24 hr tablet Commonly known as: GLUCOTROL XL  Take one tablet (2.5 mg dose) by mouth daily. Quantity: 90 tablet   HYDROcodone -acetaminophen  7.5-325 mg per tablet Commonly known as: NORCO  Take one tablet by mouth every 8 (eight) hours as needed.   isosorbide  mononitrate 60 mg 24 hr tablet  Commonly known as: IMDUR   Take 1 tablet by mouth once daily Quantity: 90 tablet   losartan  potassium 50 mg tablet Commonly known as: COZAAR   Take one tablet (50 mg dose) by mouth daily. Quantity: 30 tablet   metoprolol  tartrate 25 mg tablet Commonly known as: LOPRESSOR   Take one half tablet (12.5 mg dose) by mouth 2 (two) times daily. Quantity: 60 tablet   montelukast  10 MG tablet Commonly known as: SINGULAIR   TAKE 1 TABLET BY MOUTH EVERY DAY AT BEDTIME Quantity: 90 tablet   nitroGLYCERIN 0.4 mg SL tablet Commonly known as: NITROSTAT  Place one tablet (0.4 mg dose) under the tongue  every 5 (five) minutes as needed for Chest pain (May take up to 3 doses per episode of chest pain. If no relief after second NTG and taking third dose - call 911.). Quantity: 25 tablet   pantoprazole  sodium 40 mg tablet Commonly known as: PROTONIX   Take one tablet (40 mg dose) by mouth 2 (two) times daily.   Potassium 99 MG Tabs  Take one tablet (99 mg dose) by mouth every morning. OTC PRODUCT   ranolazine 500 mg 12 hr tablet Commonly known as: RANEXA  Take one tablet (500 mg dose) by mouth 2 (two) times daily. Quantity: 180 tablet   sertraline  50 mg tablet Commonly known as: ZOLOFT   Take one tablet (50 mg dose) by mouth every evening. Quantity: 90 tablet   tamsulosin  0.4 mg Caps Commonly known as: FLOMAX   Take two capsules (0.8 mg dose) by mouth every morning.   vitamin C 500 mg tablet Commonly known as: ASCORBIC ACID   Take one tablet (500 mg dose) by mouth every morning.   Vitamin D3 25 MCG (1000 UT) Caps  Take one capsule (1,000 Units dose) by mouth every morning.      * You might also be taking other medications not listed above. If you have questions about any of your other medications, talk to the person who prescribed them or your Primary Care Provider.          STOP taking these medications    alfuzosin HCl 10 mg 24 hr tablet Commonly known as: UROXATRAL   fluconazole 150 mg tablet Commonly known as: DIFLUCAN   mupirocin 2 % ointment Commonly known as: BACTROBAN   predniSONE  20 mg tablet Commonly known as: DELTASONE         Reason for Medication Changes: There are no medication changes patient cannot tolerate changes to statin medications secondary to myalgias  Hospital Course  Physicians involved in care during this hospitalization Attending Provider: Katheryn Rollo Jewels, DO Attending Provider: Derryl Duval, MD Attending Provider: Alberta Vella Cain, DO Admitting Provider: Derryl Duval, MD Consulting Physician: Athena Consult To  [facility] Consulting Physician: Cathlean FORBES Lanius, MD Consulting Physician: Alberta Vella Cain, DO  Indication for Admission:  CVA symptoms History of Present Illness: From H&P Angel Khan is a 83 y.o. 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     Patient says  his knees gave out this morning.  He says he felt like newly born lamb trying to get up.   He also says he was not able to hold pen on his right hand.  He says it continually dropped out  his hands.   This happened this morning.  Sounds like he has lower extremity weakness started yesterday.  In the ER code stroke was called.  Patient was given 325 mg of aspirin .  Patient is on 81 mg daily aspirin  and Plavix  secondary to his cardiovascular disease.   Vital signs are stable.  Head CT was negative for any bleeding or any significant acute findings.  CT angiogram of head and neck revealed previous coiling of aneurysm. 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    patient is admitted for observation and stroke workup.   Hospital Course:       Patient was admitted to the hospitalist service for suspected CVA based on symptom  An MRI was ordered and it did demonstrate an acute infarct in the frontal lobe on the right side.  All of his deficits resolved while he was hospitalized.  Please see report.  Had echocardiogram performed which showed an EF of 60%.  And he was seen by physical therapy, Occupational Therapy, and speech therapy and have no needs.  Patient has mentioned a chronic history of headaches and is asking to see a neurologist this would be good follow-up so I  have asked him to see his primary care provider Charmaine Burkes next week and then we have recommended him to follow-up with Dr. Laurene for of neurology for follow-up after his stroke.  His condition is stable and his disposition will be to home. 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   LUNGS:  CTA w/o accessory muscle use CV:  RRR  ABD:  +bs.  Soft.  NT.   ND.    Ext:  Warm 2+ pulses w/o cce Neuro:  Alert.  Moves all extremities  Bedside Procedures   No orders found     [facility] Care   Discharge Procedure Orders  Ambulatory referral to Neurology  Referral Priority: Routine Referral Type: Consultation  Referral Reason: Evaluate and Return  Referred to Provider: LAURENE CHARLIE BIRCH Requested Specialty: Neurology  Number of Visits Requested: 1 Expiration Date: 12/28/24   Cardiac diet (heart healthy)   Discharge instructions  Order Comments: Follow-up with your PCP call and make an appointment in the next 3 to 5 days Resume home medications as directed Your cholesterol medication has been increased to 40 mg dose from 20 mg due to your recent stroke you cannot take this dose please continue with your current dose and discuss this with your cardiologist PT/OT/speech therapy did not find any deficits from your current stroke Recommend that you check your blood pressure  daily and write it down to take it to your PCP at your next appointment Have given a recommendation for a neurologist for you to contact Return if symptoms worsen   Activity as tolerated   Appointments which have been scheduled  Jul 09, 2024 9:00 AM Annual Wellness Exam with Charmaine Heller, NP [facility] (--) [address] [place] [address] [phone]   Jul 14, 2024 9:00 AM ED/HOSPITAL/SNF FOLLOW UP with [facility] PROVIDER [facility] Stroke [facility] (--) [address] [place] [address] [phone]   Jul 20, 2024 8:15 AM Follow Up Appointment with Abby CHRISTELLA Poli, DPM [facility]) (--) [address] [place] [address] [phone]   Aug 06, 2024 10:30 AM New Patient with Omega Mutton, DO [facility] [place] (--) [address] [place] [address] [phone]   Sep 28, 2024 10:00 AM Follow Up Appointment with Norleen Carlin Breeding, MD [facility] (--) [address] [place] [address] [phone]           Wound Care Recommendations:      Recommendations to physicians:   Followup PCP 1 week  Followup   Recommendations for followup labs and/or imaging:  Rehab/Home health orders:          Code Status:   Full Code   Time spent in discharge process:  35 minutes  This note was dictated with voice recognition software. Similar sounding words can inadvertently be transcribed and may not be corrected upon review  Electronically signed: ALBERTA VELLA CAIN, DO 07/02/2024 / 6:16 PM     Note: This dictation was done with voice recognition software and may contain errors and omission but does not reflect the quality of care provided to the patient.   Note: This chart has been completed using Medical  Dictation software and while attempts have been made to ensure accuracy, certain words and phrases may not be transcribed as intended.  *Some images could not be shown.

## 2024-07-02 NOTE — Progress Notes (Addendum)
 [facility] HEALTH [facility] Case Management Initial Assessment    Patient:                   Angel Khan MRN:                          [id number] Patient Date of Birth:       01/10/1941 Age/Sex:                   83 y.o./male  Assessment   Discussed role of CM and completed assessment with: Chart Review, Patient Patient: At bedside Are you a veteran?: Yes Are you seeing a PCP with the [facility]?: No Do you have a PCP?: Yes  High risk category: Advanced Age/Over 80, Chronic disease    Patient admitted from: Patient's Home-Caregiver Support    Functional status prior to admission: Independent - not needing assistance Receives Help From/Support System: Spouse/Significant Other    Court Appointed Guardian: No                     Home Living  Type of Home: House Home Layout: Ramped entrance Bathroom Shower/Tub: Engineer, manufacturing systems: Standard Home Equipment: None Mode of Transportation: Set designer, Truck  Licensed conveyancer used at [employer] equipment used at home: Nebulizer  Social Drivers of Health  In the past 12 months, has lack of transportation kept you from medical appointments or from getting medications?: No In the past 12 months, has lack of transportation kept you from meetings, work, or from getting things needed for daily living?: No Income source: SSI/Pension/Retirement Any concerns managing your health?: No concerns     Assessment and Plan  Current Hospital Functional Status: Independent- not needing assistance Anticipated Disposition: Patient's Home-Caregiver Support Anticipated needs: No Needs  Discharge Plan  Facility/Agency Type: Home, no needs Advance Directive: No Directive     Discharge Transportation: Private vehicle  CM met with patient at bedside for consult. Patient consulted for CVA and discharge planning. 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 Patient reports being  independent in ADL's.  Requires no DME for ambulation. Patient drives and is able to make it to appointments and meet basic needs. No CM needs were identified.    Electronically signed: Melissa D Jacobs, MSW 07/02/2024 12:11 PM

## 2024-07-02 NOTE — Nursing Note (Signed)
 Acknowledgement of AVS Instructions  Discharge instructions have been reviewed with the patient/support person. The patient/support person has been provided a copy of the discharge instructions.  The patient/support person has been given the opportunity for a demonstration of specific follow-up care tasks.

## 2024-07-05 NOTE — Progress Notes (Signed)
 TCM NURSING DOCUMENTATION              TCM Requirements for Post-Discharge Contact Deadlines:  Discharge Date:: 07/02/24 7 calendar days post-discharge:: 07/09/2024 14 calendar days post-discharge:: 07/16/2024    Patient Name: Angel Khan Patient DOB: Dec 07, 1941 Patient Current Location: Home  Discharge diagnoses:  Primary discharge diagnosis:: Cerebrovascular accident (CVA), unspecified mechanism (  MEDICATION REVIEW Medications reviewed with patient/caregiver?: Yes Is the patient having any side effects they believe may be caused by any medication additions or changes?: No Does the patient have all the medications ordered at discharge?: Yes Is the patient taking all medications as directed (includes completed medication regimen)?: No Medication Compliance Comments:: MAR updated- pt unsure if he takes Glipizide. Advised of Pravastatin  dose increase   APPOINTMENTS Does the patient have a primary care provider?: Yes Does the patient have an appointment scheduled with their PCP within 14 days of discharge?: Yes Nursing Intervention for Scheduling:: Verified appointment date/time/provider (07/09/24)   SELF-MANAGEMENT Self Management Has patient been referred to home health?: No Was durable medical equipment (DME) ordered?: No Does the patient use oxygen?: No Is the patient having any difficulties with ADLs?: No    PATIENT TEACHING What is the patient's perception of their health status since discharge?: Other (please comment), Unchanged (Wheezing) Is the patient/caregiver able to teach back signs and symptoms related to disease process for when to call 911?: Yes Additional Teach Back Comments: Pt exhibits wheezing. States insurance denied neb treatment. No record of this med. Refused warm handoff to PCP office multiple times. Instructed to use Albuterol  inhaler now, has not used today. Some improvement. Discussed reasons to seek help/call 911.

## 2024-07-06 ENCOUNTER — Ambulatory Visit: Admitting: Allergy

## 2024-07-06 ENCOUNTER — Ambulatory Visit
Admission: EM | Admit: 2024-07-06 | Discharge: 2024-07-06 | Disposition: A | Discharge status: Other Acute Inpatient Hospital | Source: Ambulatory Visit | Attending: Family Medicine | Admitting: Family Medicine

## 2024-07-06 DIAGNOSIS — R0602 Shortness of breath: Secondary | ICD-10-CM

## 2024-07-06 HISTORY — DX: Cerebral infarction, unspecified: I63.9

## 2024-07-06 MED ORDER — METHYLPREDNISOLONE ACETATE 80 MG/ML IJ SUSP
80.0000 mg | Freq: Once | INTRAMUSCULAR | Status: AC
  Administered 2024-07-06: 80 mg via INTRAMUSCULAR

## 2024-07-06 MED ORDER — IPRATROPIUM-ALBUTEROL 0.5-2.5 (3) MG/3ML IN SOLN
3.0000 mL | RESPIRATORY_TRACT | Status: AC
  Administered 2024-07-06: 3 mL via RESPIRATORY_TRACT

## 2024-07-06 NOTE — ED Notes (Signed)
 Patient is being discharged from the Urgent Care and sent to the Emergency Department via EMS . Per Angel Khan, patient is in need of higher level of care due to further evaluation. Patient is aware and verbalizes understanding of plan of care.  Vitals:   07/06/24 1151  BP: (!) 147/91  Pulse: 99  Resp: (!) 28  Temp: 98 F (36.7 C)  SpO2: 98%

## 2024-07-06 NOTE — ED Notes (Signed)
 Patient is being discharged from the Urgent Care and sent to the Emergency Department via EMS . Per Reagan NP, patient is in need of higher level of care due to sob, tachy penia. Patient is aware and verbalizes understanding of plan of care.  Vitals:   07/06/24 1151  BP: (!) 147/91  Pulse: 99  Resp: (!) 28  Temp: 98 F (36.7 C)  SpO2: 98%

## 2024-07-06 NOTE — ED Provider Notes (Signed)
**Note Angel-Identified via Obfuscation**  Angel Khan CARE    CSN: [id number] Arrival date & time: 07/06/24  1144      History   Chief Complaint Chief Complaint  Patient presents with   Cough    HPI Angel Khan is a 83 y.o. male.   HPI 83 year old male presents with shortness of breath.  PMH significant for recent stroke (CVA unknown mechanism on 06/30/2024), COPD, and CAD.  Patient is currently on Plavix  and denies any unusual bleeding.  Past Medical History:  Diagnosis Date   Adenomatous colon polyp    Barrett esophagus    CAD (coronary artery disease)    Chronic headaches    Complication of anesthesia    Hard to wake up   COPD (chronic obstructive pulmonary disease) (HCC)    Coronary atherosclerosis    Diverticulitis of colon 11/2011   Diagnosed by CT scan [facility]   Dyspnea    GERD (gastroesophageal reflux disease)    Hemorrhoids    History of kidney stones    HLD (hyperlipidemia)    Horseshoe kidney    HTN (hypertension)    Melanoma (HCC)    Poor short term memory    Pre-diabetes    Reactive airway disease 04/20/2024   Sleep apnea    Can not use a cpap   Stroke [facility])    Tracheobronchomalacia    Urticaria     Patient Active Problem List   Diagnosis Date Noted   Reactive airway disease 04/20/2024   Wheeze 04/20/2024   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   Asymptomatic carotid artery stenosis without infarction, right 03/14/2021   Gastroesophageal reflux disease 04/20/2020   OSA (obstructive sleep apnea) 04/04/2020   Seasonal and perennial allergic rhinitis 04/04/2020   DOE (dyspnea on exertion) 08/04/2019   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 Prentice El   COLONOSCOPY W/ BIOPSIES AND POLYPECTOMY  09/08/2007   adenomatous polyps, diverticulosis, external hemorrhoids   COLONOSCOPY WITH PROPOFOL  N/A 05/07/2021   Procedure: COLONOSCOPY WITH PROPOFOL ;  Surgeon: Avram Lupita BRAVO, MD;  Location: [facility];  Service: Endoscopy;  Laterality: N/A;   CORONARY ARTERY BYPASS GRAFT  07/11/2009   off-pump CABG: LIMA-LAD 07/11/09; occluded graft 09/07/09, s/p DES LAD just after DIAG   CORONARY STENT PLACEMENT     x 5   ENDARTERECTOMY Left 03/14/2021   Procedure: LEFT CAROTID ARTERY ENDARTERECTOMY;  Surgeon: Serene Gaile ORN, MD;  Location: [facility] OR;  Service: Vascular;  Laterality: Left;   ESOPHAGOGASTRODUODENOSCOPY (EGD) WITH PROPOFOL  N/A 05/07/2021   Procedure: ESOPHAGOGASTRODUODENOSCOPY (EGD) WITH PROPOFOL ;  Surgeon: Avram Lupita BRAVO, MD;  Location: [facility];  Service: Endoscopy;  Laterality: N/A;   HERNIA REPAIR     abdominal   LEFT CAROTID ARTERY ENDARTERECTOMY   03/14/2021   POLYPECTOMY  05/07/2021   Procedure: POLYPECTOMY;  Surgeon: Avram Lupita BRAVO, MD;  Location: [facility];  Service: Endoscopy;;   SINOSCOPY     TONSILLECTOMY  TRANSFORAMINAL LUMBAR INTERBODY FUSION W/ MIS 2 LEVEL N/A 04/22/2022   Procedure: Lumbar Four-Five, Lumbar Five-Sacral One Minimally Invasive Laminectomies with Transforaminal Lumbar Interbody Fusion;  Surgeon: Cheryle Debby LABOR, MD;  Location: [facility] OR;  Service: Neurosurgery;  Laterality: N/A;   UMBILICAL HERNIA REPAIR     UPPER GASTROINTESTINAL ENDOSCOPY  10/12/2010   barrett's, fondic gland polyps, duodenitis       Home Medications    Prior to Admission medications   Medication Sig Start Date End Date Taking? Authorizing Provider  albuterol   (PROVENTIL ) (2.5 MG/3ML) 0.083% nebulizer solution Take 3 mLs (2.5 mg total) by nebulization every 4 (four) hours as needed for wheezing or shortness of breath (coughing fits). 06/11/24   Luke Orlan HERO, DO  albuterol  (VENTOLIN  HFA) 108 (90 Base) MCG/ACT inhaler Inhale 2 puffs into the lungs every 4 (four) hours as needed for wheezing or shortness of breath (coughing fits). 06/11/24   Luke Orlan HERO, DO  alfuzosin (UROXATRAL) 10 MG 24 hr tablet Take 10 mg by mouth daily with breakfast. 12/15/23 12/14/24  [provider]  Ascorbic Acid  (VITAMIN C WITH ROSE HIPS) 500 MG tablet Take 1,000 mg by mouth daily.    [provider]  aspirin  EC 81 MG tablet Take 81 mg by mouth daily. Swallow whole.    [provider]  baclofen  (LIORESAL ) 10 MG tablet Take 1 tablet (10 mg total) by mouth at bedtime. 06/28/24   Maranda Jamee Jacob, MD  butalbital -acetaminophen -caffeine  (FIORICET) 50-325-40 MG tablet Take 1 tablet by mouth every 6 (six) hours as needed for headache. 06/28/24 06/28/25  Maranda Jamee Jacob, MD  cloNIDine  (CATAPRES ) 0.1 MG tablet Take 0.1 mg by mouth daily.    [provider]  clopidogrel  (PLAVIX ) 75 MG tablet Take 1 tablet (75 mg total) by mouth at bedtime. Restart on 04/27/22 04/23/22   Cheryle Debby LABOR, MD  co-enzyme Q-10 30 MG capsule Take 30 mg by mouth 3 (three) times daily. 04/30/23   [provider]  diclofenac Sodium (VOLTAREN) 1 % GEL Apply 2 g topically 4 (four) times daily. 01/20/24   [provider]  ezetimibe  (ZETIA ) 10 MG tablet Take 10 mg by mouth every morning.    [provider]  famotidine  (PEPCID ) 20 MG tablet Take 20 mg by mouth daily.    [provider]  fluticasone  (FLOVENT  HFA) 110 MCG/ACT inhaler Inhale 2 puffs into the lungs in the morning and at bedtime. Use for 1-2 weeks at a time during asthma flare. Use with spacer and rinse mouth after each use. 06/16/24   Luke Orlan HERO, DO  gabapentin (NEURONTIN) 100 MG capsule Take  100 mg by mouth at bedtime.    [provider]  glipiZIDE (GLUCOTROL XL) 2.5 MG 24 hr tablet Take 2.5 mg by mouth. 04/07/24   [provider]  glucosamine-chondroitin 500-400 MG tablet Take 1 tablet by mouth every evening.    [provider]  hydrOXYzine (ATARAX) 25 MG tablet Take 25 mg by mouth 3 (three) times daily as needed. 12/15/23   [provider]  isosorbide  mononitrate (IMDUR ) 60 MG 24 hr tablet Take 60 mg by mouth at bedtime.    [provider]  loratadine (CLARITIN) 10 MG tablet Take 10 mg by mouth daily. 09/22/17   [provider]  losartan  (COZAAR ) 100 MG tablet Take 100 mg by mouth daily.    [provider]  metoprolol  tartrate (LOPRESSOR ) 25 MG tablet Take 12.5 mg by mouth 2 (two) times daily.  03/13/24   [provider]  montelukast  (SINGULAIR ) 10 MG tablet Take 1 tablet (10 mg total) by mouth at bedtime. 07/01/19   Darlean Ozell NOVAK, MD  nitroGLYCERIN (NITROSTAT) 0.4 MG SL tablet Place 0.4 mg under the tongue. 03/29/24   [provider]  pantoprazole  (PROTONIX ) 40 MG tablet Take 1 tablet (40 mg total) by mouth 2 (two) times daily before a meal. 03/29/24   Avram Lupita BRAVO, MD  POTASSIUM PO Take 99 mg by mouth daily.    [provider]  pravastatin  (PRAVACHOL ) 20 MG tablet Take 20 mg by mouth daily.    [provider]  ranolazine (RANEXA) 500 MG 12 hr tablet Take 500 mg by mouth 2 (two) times daily. 03/23/24   [provider]  sertraline  (ZOLOFT ) 50 MG tablet Take 50 mg by mouth daily. 05/08/20   [provider]  tamsulosin  (FLOMAX ) 0.4 MG CAPS capsule Take 2 capsules (0.8 mg total) by mouth daily. 02/04/24   Shona Layman BROCKS, MD  vitamin B-12 (CYANOCOBALAMIN ) 1000 MCG tablet Take 1,000 mcg by mouth daily.    [provider]    Family History Family History  Problem Relation Age of Onset   Diabetes Mother    Heart disease Mother    Heart disease Father    Rheumatic  fever Sister    Heart disease Brother    Colitis Other        niece   Colon cancer Neg Hx    Stomach cancer Neg Hx    Pancreatic cancer Neg Hx    Allergic rhinitis Neg Hx    Asthma Neg Hx    Eczema Neg Hx    Urticaria Neg Hx     Social History Social History   Tobacco Use   Smoking status: Former    Current packs/day: 0.00    Types: Cigarettes    Quit date: 07/30/1976    Years since quitting: 47.9   Smokeless tobacco: Never  Vaping Use   Vaping status: Never Used  Substance Use Topics   Alcohol use: No   Drug use: No     Allergies   Depakote [divalproex sodium], Statins, and Magnesium    Review of Systems Review of Systems  Respiratory:  Positive for shortness of breath.   All other systems reviewed and are negative.    Physical Exam Triage Vital Signs ED Triage Vitals  Encounter Vitals Group     BP      Girls Systolic BP Percentile      Girls Diastolic BP Percentile      Boys Systolic BP Percentile      Boys Diastolic BP Percentile      Pulse      Resp      Temp      Temp src      SpO2      Weight      Height      Head Circumference      Peak Flow      Pain Score      Pain Loc      Pain Education      Exclude from Growth Chart    No data found.  Updated Vital Signs BP (!) 147/91   Pulse 99   Temp 98 F (36.7 C)   Resp (!) 28   SpO2 98%   Physical Exam Vitals and nursing note reviewed.  Constitutional:      General: He is in acute distress.  Appearance: Normal appearance. He is obese. He is ill-appearing and diaphoretic.  HENT:     Head: Normocephalic and atraumatic.     Mouth/Throat:     Mouth: Mucous membranes are moist.     Pharynx: Oropharynx is clear.  Eyes:     Extraocular Movements: Extraocular movements intact.     Conjunctiva/sclera: Conjunctivae normal.     Pupils: Pupils are equal, round, and reactive to light.  Cardiovascular:     Rate and Rhythm: Regular rhythm. Tachycardia present.     Pulses: Normal pulses.      Heart sounds: Normal heart sounds. No murmur heard. Pulmonary:     Effort: Pulmonary effort is normal.     Breath sounds: No wheezing, rhonchi or rales.     Comments: Scattered diffuse rhonchi with audible wheezing noted throughout, infrequent nonproductive cough on exam respirations 26-30 on exam Musculoskeletal:        General: Normal range of motion.     Cervical back: Normal range of motion and neck supple.  Skin:    General: Skin is warm.  Neurological:     General: No focal deficit present.     Mental Status: He is alert and oriented to person, place, and time. Mental status is at baseline.     Cranial Nerves: No cranial nerve deficit.     Motor: No weakness.     Gait: Gait normal.  Psychiatric:        Mood and Affect: Mood normal.        Behavior: Behavior normal.      UC Treatments / Results  Labs (all labs ordered are listed, but only abnormal results are displayed) Labs Reviewed - No data to display  EKG   Radiology No results found.  Procedures Procedures (including critical care time)  Medications Ordered in UC Medications  ipratropium-albuterol  (DUONEB) 0.5-2.5 (3) MG/3ML nebulizer solution 3 mL (3 mLs Nebulization Given 07/06/24 1206)  methylPREDNISolone  acetate (DEPO-MEDROL ) injection 80 mg (80 mg Intramuscular Given 07/06/24 1218)    Initial Impression / Assessment and Plan / UC Course  I have reviewed the triage vital signs and the nursing notes.  Pertinent labs & imaging results that were available during my care of the patient were reviewed by me and considered in my medical decision making (see chart for details).     MDM: 1.  Shortness of breath-IM Depo-Medrol  80 mg given once in clinic, DuoNeb 0.5-2.53 mg / 3 mL nebulizer given once in clinic-patient transported to [facility] via EMS this afternoon. Final Clinical Impressions(s) / UC Diagnoses   Final diagnoses:  Shortness of breath   Discharge Instructions   None    ED Prescriptions    None    PDMP not reviewed this encounter.   Teddy Sharper, FNP 07/06/24 1425

## 2024-07-06 NOTE — ED Triage Notes (Signed)
 [facility] to [facility] with sob and cough since 6/10 with worsening over the last day or so. Pt reports he has had two stokes recently and got out of the hospital on Saturday.

## 2024-07-24 ENCOUNTER — Ambulatory Visit: Admission: EM | Admit: 2024-07-24 | Discharge: 2024-07-24 | Discharge status: Absent without leave

## 2024-07-27 ENCOUNTER — Telehealth: Payer: Self-pay | Admitting: Urology

## 2024-07-27 NOTE — Telephone Encounter (Signed)
 Patient left voicemail letting us  know that he wanted the referral resent to [facility].   Refaxed today and reopened referral

## 2024-07-30 ENCOUNTER — Ambulatory Visit: Payer: Self-pay | Admitting: Surgery

## 2024-08-10 ENCOUNTER — Ambulatory Visit: Admitting: Allergy

## 2024-08-10 ENCOUNTER — Ambulatory Visit: Admitting: Cardiology

## 2024-08-10 NOTE — Progress Notes (Deleted)
 Follow Up Note  RE: CLEBERT Khan MRN: [id number] DOB: July 31, 1941 Date of Office Visit: 08/10/2024  Referring provider: [name], NP Primary care provider: [name], NP  Chief Complaint: No chief complaint on file.  History of Present Illness: I had the pleasure of seeing Angel Khan for a follow up visit at the [facility] on 08/10/2024. He is a 83 y.o. male, who is being followed for allergic rhinitis, GERD, cough. His previous allergy  office visit was on 05/11/2024 with Dr. Luke. Today is a regular follow up visit.  Discussed the use of AI scribe software for clinical note transcription with the patient, who gave verbal consent to proceed.  History of Present Illness            ***  Assessment and Plan: [name] is a 83 y.o. male with: Respiratory infection Flared with recent dust/pollen exposure after riding in tractor in the field. Cxr normal recently. Just finished course of antibiotics and steroids for recent tick bite. Using nebulizer machine with unknown benefit. Start medrol  pak. Start Zpak. Concerns for thrush - Use nystatin  swish and swallow 5mL four times a day.  During respiratory infections/flares:  Start pulmicort  nebulizer twice a day  for 1-2 weeks until your breathing symptoms return to baseline.  Rinse mouth after each use. Pretreat with albuterol  2 puffs or albuterol  nebulizer.  If you need to use your albuterol  nebulizer machine back to back within 15-30 minutes with no relief then please go to the ER/urgent care for further evaluation.  May use albuterol  rescue inhaler 2 puffs or nebulizer every 4 to 6 hours as needed for shortness of breath, chest tightness, coughing, and wheezing. May use albuterol  rescue inhaler 2 puffs 5 to 15 minutes prior to strenuous physical activities. Monitor frequency of use - if you need to use it more than twice per week on a consistent basis let us  know.    Seasonal and perennial  allergic rhinitis Past history - 2016 bloodwork IgE 610, positive to cat, dog, grass, dust mites, trees, ragweed, weed. 09/08/2019 eosinophils 400. 2021 skin testing positive to grass and dog only.  Use over the counter antihistamines such as Zyrtec (cetirizine), Claritin (loratadine), Allegra (fexofenadine), or Xyzal (levocetirizine) daily as needed. May switch antihistamines every few months. Continue Singulair  (montelukast ) 10mg  daily at night. Stressed importance of using saline rinse after being outdoors in his tractor to prevent another flare.  Use Flonase  (fluticasone ) nasal spray 1-2 sprays per nostril once a day as needed for nasal congestion.  Nasal saline spray (i.e., Simply Saline) or nasal saline lavage (i.e., NeilMed) is recommended as needed and prior to medicated nasal sprays.   Gastroesophageal reflux disease, unspecified whether esophagitis present Continue lifestyle and dietary modifications. Continue pantoprazole  40mg  once day - nothing to eat or drink for 20-30 minutes afterwards.  Continue famotidine  20mg  once a day.   Chronic cough Past history - Followed by pulmonology, GI, ENT and cardiology. Tried Advair, Dulera, Symbicort , azithromycin  and flutter valve with no benefit. Currently on PPI for reflux. 2020 CXR no acute disease. 2016 IgE 610 positive to cat, dog, grass, dust mites, trees, ragweed, weed. 09/08/2019 eosinophils 400. 2021 spirometry showed some mild restriction. 2021 skin testing positive to grass and dog. No pets at home.  During respiratory infections/flares:  Start pulmicort  nebulizer twice a day  for 1-2 weeks until your breathing symptoms return to baseline.  Rinse mouth after each use. Pretreat with albuterol  2 puffs or albuterol  nebulizer.  If you need to use your albuterol  nebulizer machine back to back within 15-30 minutes with no relief then please go to the ER/urgent care for further evaluation.  May use albuterol  rescue inhaler 2 puffs or nebulizer  every 4 to 6 hours as needed for shortness of breath, chest tightness, coughing, and wheezing. May use albuterol  rescue inhaler 2 puffs 5 to 15 minutes prior to strenuous physical activities. Monitor frequency of use - if you need to use it more than twice per week on a consistent basis let us  know.  Get spirometry at next visit. Assessment and Plan              No follow-ups on file.  No orders of the defined types were placed in this encounter.  Lab Orders  No laboratory test(s) ordered today    Diagnostics: Spirometry:  Tracings reviewed. His effort: {Blank single:19197::Good reproducible efforts.,It was hard to get consistent efforts and there is a question as to whether this reflects a maximal maneuver.,Poor effort, data can not be interpreted.} FVC: ***L FEV1: ***L, ***% predicted FEV1/FVC ratio: ***% Interpretation: {Blank single:19197::Spirometry consistent with mild obstructive disease,Spirometry consistent with moderate obstructive disease,Spirometry consistent with severe obstructive disease,Spirometry consistent with possible restrictive disease,Spirometry consistent with mixed obstructive and restrictive disease,Spirometry uninterpretable due to technique,Spirometry consistent with normal pattern,No overt abnormalities noted given today's efforts}.  Please see scanned spirometry results for details.  Skin Testing: {Blank single:19197::Select foods,Environmental allergy  panel,Environmental allergy  panel and select foods,Food allergy  panel,None,Deferred due to recent antihistamines use}. *** Results discussed with patient/family.   Medication List:  Current Outpatient Medications  Medication Sig Dispense Refill  . albuterol  (PROVENTIL ) (2.5 MG/3ML) 0.083% nebulizer solution Take 3 mLs (2.5 mg total) by nebulization every 4 (four) hours as needed for wheezing or shortness of breath (coughing fits). 75 mL 1  . albuterol  (VENTOLIN  HFA)  108 (90 Base) MCG/ACT inhaler Inhale 2 puffs into the lungs every 4 (four) hours as needed for wheezing or shortness of breath (coughing fits). 18 g 1  . alfuzosin (UROXATRAL) 10 MG 24 hr tablet Take 10 mg by mouth daily with breakfast.    . Ascorbic Acid  (VITAMIN C WITH ROSE HIPS) 500 MG tablet Take 1,000 mg by mouth daily.    . aspirin  EC 81 MG tablet Take 81 mg by mouth daily. Swallow whole.    . baclofen  (LIORESAL ) 10 MG tablet Take 1 tablet (10 mg total) by mouth at bedtime. 10 each 0  . butalbital -acetaminophen -caffeine  (FIORICET) 50-325-40 MG tablet Take 1 tablet by mouth every 6 (six) hours as needed for headache. 20 tablet 0  . cloNIDine  (CATAPRES ) 0.1 MG tablet Take 0.1 mg by mouth daily.    . clopidogrel  (PLAVIX ) 75 MG tablet Take 1 tablet (75 mg total) by mouth at bedtime. Restart on 04/27/22    . co-enzyme Q-10 30 MG capsule Take 30 mg by mouth 3 (three) times daily.    . diclofenac Sodium (VOLTAREN) 1 % GEL Apply 2 g topically 4 (four) times daily.    . ezetimibe  (ZETIA ) 10 MG tablet Take 10 mg by mouth every morning.    . famotidine  (PEPCID ) 20 MG tablet Take 20 mg by mouth daily.    . fluticasone  (FLOVENT  HFA) 110 MCG/ACT inhaler Inhale 2 puffs into the lungs in the morning and at bedtime. Use for 1-2 weeks at a time during asthma flare. Use with spacer and rinse mouth after each use. 1 each 3  . gabapentin (NEURONTIN) 100 MG capsule Take  100 mg by mouth at bedtime.    SABRA glipiZIDE (GLUCOTROL XL) 2.5 MG 24 hr tablet Take 2.5 mg by mouth.    SABRA glucosamine-chondroitin 500-400 MG tablet Take 1 tablet by mouth every evening.    . hydrOXYzine (ATARAX) 25 MG tablet Take 25 mg by mouth 3 (three) times daily as needed.    . isosorbide  mononitrate (IMDUR ) 60 MG 24 hr tablet Take 60 mg by mouth at bedtime.    SABRA loratadine (CLARITIN) 10 MG tablet Take 10 mg by mouth daily.    . losartan  (COZAAR ) 100 MG tablet Take 100 mg by mouth daily.    . metoprolol  tartrate (LOPRESSOR ) 25 MG tablet Take  12.5 mg by mouth 2 (two) times daily.    . montelukast  (SINGULAIR ) 10 MG tablet Take 1 tablet (10 mg total) by mouth at bedtime. 30 tablet 11  . nitroGLYCERIN (NITROSTAT) 0.4 MG SL tablet Place 0.4 mg under the tongue.    . pantoprazole  (PROTONIX ) 40 MG tablet Take 1 tablet (40 mg total) by mouth 2 (two) times daily before a meal. 180 tablet 3  . POTASSIUM PO Take 99 mg by mouth daily.    . pravastatin  (PRAVACHOL ) 20 MG tablet Take 20 mg by mouth daily.    . ranolazine (RANEXA) 500 MG 12 hr tablet Take 500 mg by mouth 2 (two) times daily.    . sertraline  (ZOLOFT ) 50 MG tablet Take 50 mg by mouth daily.    . tamsulosin  (FLOMAX ) 0.4 MG CAPS capsule Take 2 capsules (0.8 mg total) by mouth daily. 60 capsule 11  . vitamin B-12 (CYANOCOBALAMIN ) 1000 MCG tablet Take 1,000 mcg by mouth daily.     No current facility-administered medications for this visit.   Allergies: Allergies  Allergen Reactions  . Depakote [Divalproex Sodium]     Hair loss   . Statins Other (See Comments)    Muscle cramps  . Magnesium  Rash    Liquid    I reviewed his past medical history, social history, family history, and environmental history and no significant changes have been reported from his previous visit.  Review of Systems  Constitutional:  Negative for appetite change, chills, fever and unexpected weight change.  HENT:  Positive for congestion, rhinorrhea and sneezing.   Eyes:  Negative for itching.  Respiratory:  Positive for cough, chest tightness, shortness of breath and wheezing.   Cardiovascular:  Negative for chest pain.  Gastrointestinal:  Negative for abdominal pain.  Genitourinary:  Negative for difficulty urinating.  Skin:  Negative for rash.  Allergic/Immunologic: Positive for environmental allergies.  Neurological:  Negative for headaches.    Objective: There were no vitals taken for this visit. There is no height or weight on file to calculate BMI. Physical Exam Vitals and nursing note  reviewed.  Constitutional:      Appearance: Normal appearance. He is well-developed.  HENT:     Head: Normocephalic and atraumatic.     Right Ear: Tympanic membrane and external ear normal.     Left Ear: Tympanic membrane and external ear normal.     Nose: Nose normal.     Mouth/Throat:     Mouth: Mucous membranes are moist.     Pharynx: Oropharynx is clear.  Eyes:     Conjunctiva/sclera: Conjunctivae normal.  Cardiovascular:     Rate and Rhythm: Normal rate and regular rhythm.     Heart sounds: Normal heart sounds. No murmur heard.    No friction rub. No gallop.  Pulmonary:  Effort: Pulmonary effort is normal.     Breath sounds: Wheezing (slight wheezing on lower lobes) present. No rhonchi.  Musculoskeletal:     Cervical back: Neck supple.  Skin:    General: Skin is warm.     Findings: No rash.  Neurological:     Mental Status: He is alert and oriented to person, place, and time.  Psychiatric:        Behavior: Behavior normal.   Previous notes and tests were reviewed. The plan was reviewed with the patient/family, and all questions/concerned were addressed.  It was my pleasure to see Angel Khan today and participate in his care. Please feel free to contact me with any questions or concerns.  Sincerely,  Orlan Cramp, DO Allergy  & Immunology  [facility]  [place] office: [phone] [facility] office: [phone]

## 2024-08-17 ENCOUNTER — Encounter: Payer: Self-pay | Admitting: Neurology

## 2024-10-07 NOTE — Progress Notes (Signed)
 [facility] [place] NEW PATIENT EVALUATION   Referring Physician:  [name], NP Primary Care Physician:  Pfeiffer Suanne, NP   Patient ID:  Angel Khan is a 83 y.o. (DOB 12/01/1941) male.    Subjective   HPI:  Angel Khan is a 83 y.o. male referred by Dr. Kyung for chronic headaches.  Patient reports onset of his symptoms approximately 5 years ago.  Patient currently endorses that he is having approximately 5 headache days per week.  Headaches are generally located in the right frontal region and described as sharp.  Headache pain will reach a 7-8/10 in severity.  Patient notes that he has an increase in his headache symptoms when he forward flexes his cervical spine as well as occasionally getting a headache in the occipital region.  Patient symptoms will last up to 24 hours.  He does have occasional nausea and vomiting as well as photo and phonophobia.  He has been utilizing Fioricet for abortive therapy.  He has been taking up to 4 Fioricet per day.  Patient denies any complicating neurological features associated with his headaches.  He is not currently on a prophylactic agent.  Mental Health: Stress is low, no history of anxiety or depression Sleep Quality:  Terrible  Exercise: 0 days per week  Caffeine  use:  Coffee: 1/day Alcohol Use: No Tobacco Use: no Vaping: No Artificial Sweetener Use: No Use of Illicit drugs: No  CURRENT AND PAST MEDICATIONS:  Analgesics: Fioricet, hydrocodone   Anti-migraine:   Heart/bp:    Decongestant/ antihistamine:   Anti-nauseant:   Nsaids:   Muscle relaxants:   Anti-convulsants: Gabapentin  Steroids:   Sleeping pills/ tranquilizers:   Anti-depressants: Cymbalta, nortriptyline  Herbal:    Fibromyalgia:    Hormonal:   Other:   Procedures for headaches: Occipital nerve block    Past Medical History, Past Surgery History, Social History, and Family History were reviewed and updated.    Past  Medical History:  Diagnosis Date  . AKI (acute kidney injury) 07/10/2021  . Anesthesia    DIFFICULT TO WAKE UP AFTER HEART BYPASS.  HAD CHEST TUBES PULLED BEFORE HE WAS AWAKE, BECAUSE HE WAS OUT FOR SO LONG.  . Aneurysm 2004   BRAIN ANEURYSM  . Anticoagulated    Plavix  for cardiac stent & h/o stroke  . Barrett esophagus   . CHF (congestive heart failure) (*)   . CKD (chronic kidney disease) stage 3, GFR 30-59 ml/min (*)   . Colon polyp   . Coronary artery disease    03/2008 stents, 7/ 2010 CABG, by end of july had TCA and stent of LAD/LIMA  . Diabetes mellitus (*)    BORDERLINE  . Diverticulitis   . Full dentures   . GERD (gastroesophageal reflux disease)   . Gout   . History of bleeding ulcers   . History of stomach ulcers   . History of transfusion 1992   ALMOST DIED FROM A BLEEDING ULCER  . Hyperlipidemia   . Insomnia   . Mitral valve regurgitation   . OSA on CPAP    does not use CPAP  . Productive cough    GREEN  . Rheumatic fever   . S/P CABG (coronary artery bypass graft)    CABG & MULT PCIs  . Shortness of breath 06/2017  . Stroke (*) 2004   LIGHT STROKE WHEN HE HAD HIS ANEURYSM.  SABRA Tremors of nervous system     Past Surgical History:  Procedure Laterality Date  .  Appendectomy    . Artery surgery    . Cardiac catheterization  11/2019  . Cerebral aneurysm repair    . Cholecystectomy    . Colonoscopy w/ polypectomy    . Coronary artery bypass graft  2008   SINGLE  . Coronary stent placement     x5 (4 BEFORE BYPASS AND 1 AFTER)  . Hernia repair    . Tonsillectomy    . Upper gastrointestinal endoscopy     Family History  Problem Relation Age of Onset  . Heart disease Mother   . Diabetes Mother   . Heart disease Father   . Heart disease Brother        2 MI's  . Heart disease Brother    Social History[1]    Current Home Medications  Medication Sig  albuterol  sulfate HFA (PROVENTIL ,VENTOLIN ,PROAIR ) 108 (90 Base) MCG/ACT inhaler Inhale one puff to  two puffs into the lungs every 6 (six) hours as needed for Wheezing.  ascorbic acid  (VITAMIN C) 500 MG tablet Take one tablet (500 mg dose) by mouth every morning.  aspirin  (ECOTRIN LOW DOSE) EC tablet Take one tablet (81 mg dose) by mouth at bedtime.  baclofen  (LIORESAL ) 10 mg tablet Take one tablet (10 mg dose) by mouth at bedtime.  budeson-glycopyrrol-formoterol  (BREZTRI AEROSPHERE) 160-9-4.8 MCG/ACT AERO inhaler Inhale 2 puffs into the lungs 2 (two) times daily.  butalbital -acetaminophen -caffeine  (ESGIC) 50-325-40 mg per tablet Take one tablet by mouth daily as needed for Pain. Max Daily Amount: 1 tablet  Cholecalciferol (VITAMIN D3) 25 MCG (1000 UT) CAPS Take one capsule (1,000 Units dose) by mouth every morning.  clopidogrel  bisulfate (PLAVIX ) 75 mg tablet Take one tablet (75 mg dose) by mouth at bedtime.  Coenzyme Q10 (CO Q10) 30 MG CAPS Take 30 mg by mouth.  Cyanocobalamin  (B-12) 1000 MCG TABS Take 1,000 mcg by mouth every morning.  diclofenac sodium (VOLTAREN) 1 % gel Apply two g topically 4 (four) times a day as needed.  ezetimibe  (ZETIA ) 10 MG tablet TAKE 1 TABLET BY MOUTH ONCE DAILY AFTER BREAKFAST  famotidine  (PEPCID ) 20 mg tablet Take one tablet (20 mg dose) by mouth 1 time each day at the same time.  glipiZIDE ER (GLUCOTROL XL) 5 mg 24 hr tablet Take one tablet (5 mg dose) by mouth daily.  HYDROcodone -homatropine (HYCODAN,HYDROMET) 5-1.5 mg/5 mL solution Take 5 mLs by mouth every 6 (six) hours as needed.  isosorbide  mononitrate (IMDUR ) 60 mg 24 hr tablet Take 1 tablet by mouth once daily  losartan  potassium (COZAAR ) 50 mg tablet Take one tablet (50 mg dose) by mouth daily.  MAGNESIUM  PO Take 400 mg by mouth.  MAGNESIUM  PO Take 400 mg by mouth.  MAGNESIUM -POTASSIUM PO Take 400 mg by mouth.  methylPREDNISolone  (MEDROL  DOSEPACK) 4 mg tablet Take one tablet (4 mg dose) by mouth daily. follow package directions  metoprolol  tartrate (LOPRESSOR ) 25 mg tablet Take one half tablet (12.5 mg  dose) by mouth 2 (two) times daily.  montelukast  (SINGULAIR ) 10 MG tablet TAKE 1 TABLET BY MOUTH ONCE DAILY AT BEDTIME  nitroGLYCERIN (NITROSTAT) 0.4 mg SL tablet Place one tablet (0.4 mg dose) under the tongue every 5 (five) minutes as needed for Chest pain (May take up to 3 doses per episode of chest pain. If no relief after second NTG and taking third dose - call 911.).  pantoprazole  sodium (PROTONIX ) 40 mg tablet Take one tablet (40 mg dose) by mouth 2 (two) times daily.  Potassium 99 MG TABS Take one tablet (99  mg dose) by mouth every morning. OTC PRODUCT  pravastatin  sodium (PRAVACHOL ) 20 mg tablet Take two tablets (40 mg dose) by mouth at bedtime.  ranolazine (RANEXA) 1000 MG SR tablet Take one tablet (1,000 mg dose) by mouth 2 (two) times daily.  sertraline  (ZOLOFT ) 50 mg tablet TAKE 1 TABLET BY MOUTH ONCE DAILY IN THE EVENING  tamsulosin  (FLOMAX ) 0.4 mg CAPS Take two capsules (0.8 mg dose) by mouth daily.  topiramate (TOPAMAX) 25 MG tablet 1 tab by mouth at bedtime  triamcinolone acetonide (KENALOG) 0.1% cream SMARTSIG:1 Application Topical 2-3 Times Daily  ubrogepant (UBRELVY) 100 mg tablet Take one hundred mg by mouth daily as needed.    The patient is allergic to atrovent  nasal spray [ipratropium], atrovent , depakote, divalproex sodium, magnesium -containing compounds, other, propranolol, protonix , statins, statins-hmg-coa reductase inhibitors, valproic acid, and pravastatin .  Review of Systems is complete and negative except as noted in History of Present Illness.  Objective    PHYSICAL EXAM: BP 142/78 (BP Location: Left Upper Arm)   Pulse 75   Ht 5' 5 (1.651 m)   Wt 189 lb 9.6 oz (86 kg)   SpO2 95%   BMI 31.55 kg/m  GENERAL: Awake, alert, in no acute distress Psych: Affect appropriate for situation, patient is calm and cooperative with examination Head: Normocephalic and atraumatic, without obvious abnormality EENT: Normal conjunctivae, dry mucous membranes, no OP  obstruction LUNGS: Normal respiratory effort. Non-labored breathing on room air. Clear to auscultation bilaterally. CV: Auscultation: regular rate and rhythm. Extremities: Warm, well perfused, without obvious deformity  MENTAL STATUS EXAM: Orientation: Alert and oriented to person, place and time. Memory: Cooperative, follows commands well. Recent and remote memory normal.. Attention, concentration: Attention span and concentration are normal. Language: Speech is clear and language is normal. Fund of knowledge: Aware of current events, vocabulary appropriate for patient age.   CRANIAL NERVES: CN 2 (Optic): Visual fields intact to confrontation, funduscopic examination without optic disk pallor or edema, retinal vessels are normal. CN 3,4,6 (EOM): Pupils equal and reactive to light. Full extraocular eye movement without nystagmus. CN 5 (Trigeminal): Facial sensation is normal, no weakness of masticatory muscles. CN 7 (Facial): No facial weakness or asymmetry. CN 8 (Auditory): Auditory acuity grossly normal. CN 9,10 (Glossophar): The uvula is midline, the palate elevates symmetrically. CN 11 (spinal access): Normal sternocleidomastoid and trapezius strength. CN 12 (Hypoglossal): The tongue is midline. No atrophy or fasciculations.   MOTOR: Muscle Strength: Strength - 5/5 and symmetric in the upper and lower extremities, no pronation or drift. Muscle Tone: Tone and muscle bulk are normal in the upper and lower extremities.   REFLEXES:   DTRs - 2+ and symmetrical in all four extremities, plantar responses are flexor bilaterally.   COORDINATION:   Intact finger-to-nose, heel-to-shin, and rapid alternating movements, no tremor.   SENSATION:  Intact to light touch, vibration, pinprick.  No sensory extinction to DSS. Negative Romberg test.  GAIT: Routine and tandem gait are normal.    DIAGNOSTIC STUDIES:    MRI Head w/o contrast -07/01/2024 -Small acute/recent infarct in the right  posterior frontal lobe periventricular white matter.   LABORATORY STUDIES:   Office Visit on 07/23/2024  Component Date Value Ref Range Status  . Hemoglobin A1c 07/23/2024 7.9 (A)  4.8 - 5.6 % Final  . Vit D, 25-Hydroxy 07/23/2024 28.6 (L)  30.0 - 100.0 ng/mL Final  . PSA 07/23/2024 1.4  0.0 - 4.0 ng/mL Final  . Glucose 07/23/2024 95  70 - 99 mg/dL Final  .  BUN 07/23/2024 24  8 - 27 mg/dL Final  . Creatinine 92/74/7974 1.07  0.76 - 1.27 mg/dL Final  . eGFR 92/74/7974 70  >59 mL/min/1.73 Final  . BUN/Creatinine Ratio 07/23/2024 22  10 - 24 Final  . Sodium 07/23/2024 142  134 - 144 mmol/L Final  . Potassium 07/23/2024 4.9  3.5 - 5.2 mmol/L Final  . Chloride 07/23/2024 104  96 - 106 mmol/L Final  . CO2 07/23/2024 24  20 - 29 mmol/L Final  . CALCIUM  07/23/2024 9.1  8.6 - 10.2 mg/dL Final  . Total Protein 07/23/2024 6.2  6.0 - 8.5 g/dL Final  . Albumin, Serum 07/23/2024 4.1  3.7 - 4.7 g/dL Final  . Globulin, Total 07/23/2024 2.1  1.5 - 4.5 g/dL Final  . Total Bilirubin 07/23/2024 0.4  0.0 - 1.2 mg/dL Final  . Alkaline Phosphatase 07/23/2024 79  44 - 121 IU/L Final  . AST 07/23/2024 14  0 - 40 IU/L Final  . ALT (SGPT) 07/23/2024 22  0 - 44 IU/L Final  . WBC 07/23/2024 9.9  3.4 - 10.8 x10E3/uL Final  . RBC 07/23/2024 4.66  4.14 - 5.80 x10E6/uL Final  . Hemoglobin 07/23/2024 14.3  13.0 - 17.7 g/dL Final  . Hematocrit 92/74/7974 44.1  37.5 - 51.0 % Final  . MCV 07/23/2024 95  79 - 97 fL Final  . MCH 07/23/2024 30.7  26.6 - 33.0 pg Final  . MCHC 07/23/2024 32.4  31.5 - 35.7 g/dL Final  . RDW 92/74/7974 14.7  11.6 - 15.4 % Final  . Platelet Count 07/23/2024 205  150 - 450 x10E3/uL Final  . Neutrophils 07/23/2024 55  Not Estab. % Final  . Lymphs Relative 07/23/2024 25  Not Estab. % Final  . Monocytes 07/23/2024 9  Not Estab. % Final  . Eos Relative 07/23/2024 9  Not Estab. % Final  . Basos Relative  07/23/2024 1  Not Estab. % Final  . Neutrophils Absolute 07/23/2024 5.4  1.4 - 7.0 x10E3/uL  Final  . Lymphocytes Absolute 07/23/2024 2.5  0.7 - 3.1 x10E3/uL Final  . Monocytes Absolute 07/23/2024 0.8  0.1 - 0.9 x10E3/uL Final  . Eosinophils Absolute 07/23/2024 0.9 (H)  0.0 - 0.4 x10E3/uL Final  . Basophils Absolute 07/23/2024 0.1  0.0 - 0.2 x10E3/uL Final  . Immature Granulocytes 07/23/2024 1  Not Estab. % Final  . Immature Grans (Abs) 07/23/2024 0.1  0.0 - 0.1 x10E3/uL Final  . Cholesterol, Total 07/23/2024 192  100 - 199 mg/dL Final  . Triglycerides 07/23/2024 230 (H)  0 - 149 mg/dL Final  . HDL 92/74/7974 44  >39 mg/dL Final  . VLDL Cholesterol Cal 07/23/2024 40  5 - 40 mg/dL Final  . LDL 92/74/7974 108 (H)  0 - 99 mg/dL Final  . LDL/HDL Ratio 07/23/2024 2.5  0.0 - 3.6 ratio Final  Office Visit on 07/09/2024  Component Date Value Ref Range Status  . Hemoglobin A1c 07/09/2024 7.9 (A)  4.8 - 5.6 % Final  Admission on 07/06/2024, Discharged on 07/06/2024  Component Date Value Ref Range Status  . WBC 07/06/2024 10.3  4.0 - 10.5 thou/mcL Final  . RBC 07/06/2024 4.68  4.63 - 6.08 million/mcL Final  . HGB 07/06/2024 14.3  13.7 - 17.5 gm/dL Final  . HCT 92/91/7974 43.9  40.1 - 51.0 % Final  . MCV 07/06/2024 93.8 (H)  79.0 - 92.2 fL Final  . MCH 07/06/2024 30.6  25.7 - 32.2 pg Final  . MCHC 07/06/2024 32.6  32.3 - 36.5 gm/dL Final  . Plt Ct 92/91/7974 274  150 - 400 thou/mcL Final  . RDW SD 07/06/2024 53.8 (H)  35.1 - 46.3 fL Final  . MPV 07/06/2024 9.3 (L)  9.4 - 12.4 fL Final  . NRBC% 07/06/2024 0.0  0.0 - 0.2 /100WBC Final  . Absolute NRBC Count 07/06/2024 0.00  0.00 - 0.01 thou/mcL Final  . NEUTROPHIL % 07/06/2024 51.2  % Final  . LYMPHOCYTE % 07/06/2024 27.8  % Final  . MONOCYTE % 07/06/2024 8.0  % Final  . Eosinophil % 07/06/2024 10.9  % Final  . BASOPHIL % 07/06/2024 0.8  % Final  . IG% 07/06/2024 1.3  % Final  . ABSOLUTE NEUTROPHIL COUNT 07/06/2024 5.26  1.50 - 7.50 thou/mcL Final  . ABSOLUTE LYMPHOCYTE COUNT 07/06/2024 2.86  1.00 - 4.50 thou/mcL Final  . Absolute  Monocyte Count 07/06/2024 0.82 (H)  0.10 - 0.80 thou/mcL Final  . Absolute Eosinophil Count 07/06/2024 1.12 (H)  0.00 - 0.50 thou/mcL Final  . Absolute Basophil Count 07/06/2024 0.08  0.00 - 0.20 thou/mcL Final  . Absolute Immature Granulocyte Count 07/06/2024 0.13 (H)  0.00 - 0.03 thou/mcL  Final  . Na 07/06/2024 141  136 - 146 mmol/L Final  . Potassium 07/06/2024 4.4  3.7 - 5.4 mmol/L Final  . Cl 07/06/2024 103  97 - 108 mmol/L Final  . CO2 07/06/2024 24  20 - 32 mmol/L Final  . AGAP 07/06/2024 14  7 - 16 mmol/L Final  . Glucose 07/06/2024 193 (H)  65 - 99 mg/dL Final  . BUN 92/91/7974 24  8 - 27 mg/dL Final  . Creatinine 92/91/7974 1.08  0.76 - 1.27 mg/dL Final  . Ca 92/91/7974 9.3  8.6 - 10.2 mg/dL Final  . ALK PHOS 92/91/7974 87  25 - 160 U/L Final  . T Bili 07/06/2024 0.3  0.0 - 1.2 mg/dL Final  . Total Protein 07/06/2024 7.0  6.0 - 8.5 gm/dL Final  . Alb 92/91/7974 4.5  3.5 - 4.7 gm/dL Final  . GLOBULIN 92/91/7974 2.5  1.5 - 4.5 gm/dL Final  . ALBUMIN/GLOBULIN RATIO 07/06/2024 1.8  1.1 - 2.5 Final  . BUN/CREAT RATIO 07/06/2024 22.2  11.0 - 26.0 Final  . ALT 07/06/2024 17  0 - 55 U/L Final  . AST 07/06/2024 15  0 - 40 U/L Final  . eGFR 07/06/2024 69  >=60 mL/min/1.71m2 Final  . PH VENOUS 07/06/2024 7.370  7.310 - 7.410 Final  . PCO2 VENOUS 07/06/2024 45.8  40.0 - 50.0 mmHg Final  . PO2 VENOUS 07/06/2024 42.0  40.0 - 50.0 mmHg Final  . BASE EXCESS VENOUS 07/06/2024 0.6  -2.0 - 2.0 mmol/L Final  . BICARBONATE VENOUS 07/06/2024 27  21 - 28 mmol/L Final  . PATIENT TEMPERATURE 07/06/2024 37.0  Deg Final  . OXYGEN SATURATION MEASURED VEN 07/06/2024 79.2  % Final  . NT-ProBNP 07/06/2024 199  <=1,799 pg/mL Final  . TnT-Gen5 (0hr) 07/06/2024 17  <22 ng/L Final  . TnT-Gen5 (1hr) 07/06/2024 15  <22 ng/L Final  . Delta 1 Hour 07/06/2024 -2  <5 ng/L Final  . TnT-Gen5 (3hr) 07/06/2024 14  <22 ng/L Final  . Delta 3 Hour 07/06/2024 -3  <7 ng/L Final  Admission on 06/30/2024, Discharged on  07/02/2024  Component Date Value Ref Range Status  . Glucose, POC 06/30/2024 193 (H)  70 - 99 mg/dL Final  . OPERATOR ID [id number]   Final  . INSTRUMENT ID 06/30/2024 [id number]  Final  . Glucose, POC 06/30/2024 225 (H)  70 - 99 mg/dL Final  . OPERATOR ID [id number]   Final  . INSTRUMENT ID 06/30/2024 [id number]   Final  . WBC 06/30/2024 8.0  4.0 - 10.5 thou/mcL Final  . RBC 06/30/2024 4.79  4.63 - 6.08 million/mcL Final  . HGB 06/30/2024 14.6  13.7 - 17.5 gm/dL Final  . HCT 92/97/7974 45.3  40.1 - 51.0 % Final  . MCV 06/30/2024 94.6 (H)  79.0 - 92.2 fL Final  . MCH 06/30/2024 30.5  25.7 - 32.2 pg Final  . MCHC 06/30/2024 32.2 (L)  32.3 - 36.5 gm/dL Final  . Plt Ct 92/97/7974 232  150 - 400 thou/mcL Final  . RDW SD 06/30/2024 54.0 (H)  35.1 - 46.3 fL Final  . MPV 06/30/2024 9.3 (L)  9.4 - 12.4 fL Final  . NRBC% 06/30/2024 0.0  0.0 - 0.2 /100WBC Final  . Absolute NRBC Count 06/30/2024 0.00  0.00 - 0.01 thou/mcL Final  . NEUTROPHIL % 06/30/2024 47.4  % Final  . LYMPHOCYTE % 06/30/2024 30.1  % Final  . MONOCYTE % 06/30/2024 9.8  % Final  . Eosinophil % 06/30/2024 10.3  % Final  . BASOPHIL % 06/30/2024 1.4  % Final  . IG% 06/30/2024 1.0  % Final  . ABSOLUTE NEUTROPHIL COUNT 06/30/2024 3.78  1.50 - 7.50 thou/mcL Final  . ABSOLUTE LYMPHOCYTE COUNT 06/30/2024 2.40  1.00 - 4.50 thou/mcL Final  . Absolute Monocyte Count 06/30/2024 0.78  0.10 - 0.80 thou/mcL Final  . Absolute Eosinophil Count 06/30/2024 0.82 (H)  0.00 - 0.50 thou/mcL Final  . Absolute Basophil Count 06/30/2024 0.11  0.00 - 0.20 thou/mcL Final  . Absolute Immature Granulocyte Count 06/30/2024 0.08 (H)  0.00 - 0.03 thou/mcL  Final  . Na 06/30/2024 140  136 - 146 mmol/L Final  . Potassium 06/30/2024 4.4  3.7 - 5.4 mmol/L Final  . Cl 06/30/2024 102  97 - 108 mmol/L Final  . CO2 06/30/2024 26  20 - 32 mmol/L Final  . AGAP 06/30/2024 12  7 - 16 mmol/L Final  . Glucose 06/30/2024 180 (H)  65 - 99 mg/dL Final   . BUN 92/97/7974 23  8 - 27 mg/dL Final  . Creatinine 92/97/7974 1.21  0.76 - 1.27 mg/dL Final  . Ca 92/97/7974 9.6  8.6 - 10.2 mg/dL Final  . BUN/CREAT RATIO 06/30/2024 19.0  11.0 - 26.0 Final  . eGFR 06/30/2024 60  >=60 mL/min/1.57m2 Final  . PT 06/30/2024 12.8  11.8 - 14.3 second(s) Final  . INR 06/30/2024 1.0  See Therapeutic ranges Final  . PTT 06/30/2024 32  22 - 35 second(s) Final  . Glucose, POC 06/30/2024 185 (H)  70 - 99 mg/dL Final  . OPERATOR ID 92/97/7974 [id number]   Final  . INSTRUMENT ID 06/30/2024 [id number]   Final  . Glucose, POC 07/01/2024 155 (H)  70 - 99 mg/dL Final  . OPERATOR ID [id number]   Final  . INSTRUMENT ID 07/01/2024 [id number]   Final  . CHOLESTEROL TOTAL 07/01/2024 190  100 - 199 mg/dL Final  . Trig 92/96/7974 232 (H)  0 - 149 mg/dL Final  . LDL 92/96/7974 102 (H)  0 - 99 mg/dL Final  . VLDL 92/96/7974 46 (H)  5 - 40 mg/dl Final  . CHOL/HDL 92/96/7974 5  0 - 5 Final  . HDL 07/01/2024 42  >=39 mg/dL Final  . LA M-L Dimension (A4C) 07/01/2024 5.670  cm Final  .  LA S-I Dimension (A2C) 07/01/2024 6.550  cm Final  . LA Area Sys (A2C) 07/01/2024 20.000  cm2 Final  . LA Area Sys (A4C) 07/01/2024 21.500  cm2 Final  . LA ESV (A2C) 07/01/2024 56.400  mL Final  . LA ESV (A4C) 07/01/2024 56.500  mL Final  . LA Volume Index (BP) 07/01/2024 31.5  mL/m2 Final  . LA A-P Dimension 07/01/2024 4.600  cm Final  . Left Atrium Dimension 2D 07/01/2024 4.600  cm Final  . LA ESV Index (A4C) 07/01/2024 29.400  ml/m2 Final  . LA/Ao Ratio 07/01/2024 1.350  no units Final  . RA Major 07/01/2024 5.900  cm Final  . RA Volume 07/01/2024 56.100  mL Final  . Aortic valve mean velocity 07/01/2024 0.911  m/s Final  . Aortic valve mean velocity 07/01/2024 0.873  m/s Final  . AoV Mean Gradient 07/01/2024 4.000  mmHg Final  . Aortic valve velocity time integral 07/01/2024 0.288  m Final  . Aortic valve mean velocity 07/01/2024 0.892  m/s Final  . AV VMAX 07/01/2024 1.370   m/s Final  . AoV Peak Velocity 07/01/2024 1.330  m/s Final  . AoV Peak Gradient 07/01/2024 7.000  mmHg Final  . AVA (VTI) 07/01/2024 1.920  cm2 Final  . AoV Valve Area 07/01/2024 1.81  cm2 Final  . AVA/BSA 07/01/2024 1.000  no units Final  . AV index (prosthetic) 07/01/2024 0.640  no units Final  . Aortic root 07/01/2024 3.20  cm Final  . Ao root annulus 07/01/2024 3.400  cm Final  . EF - 3D Echo 07/01/2024 53  % Final  . Left Ventricular EF by 2-D Biplane* 07/01/2024 55.100  % Final  . Left Ventricular EF by Teichholz M* 07/01/2024 46.700  % Final  . LV Stroke Volume 07/01/2024 47.000  mL Final  . LVOT stroke volume 07/01/2024 55.000  cm3 Final  . LVEDV 07/01/2024 117.0  ml Final  . LV Diastolic Volume (BP) 07/01/2024 88.0  mL Final  . LVES V 07/01/2024 54.4  ml Final  . LV Systolic Volume (BP) 07/01/2024 41.0  mL Final  . IVSd 07/01/2024 0.7  0.7 - 1.2 cm Final  . IVS 07/01/2024 0.800  cm Final  . LVIDd 07/01/2024 5.000  4.60 - 6.39 cm Final  . LVIDD 07/01/2024 4.70  cm Final  . LVIDs 07/01/2024 3.600  2.71 - 4.11 cm Final  . Left Ventricular Outflow Tract Cro* 07/01/2024 19.000  mm Final  . LVOT Diameter 07/01/2024 1.900  cm Final  . LVOT Mean Gradient 07/01/2024 2.0  mmHg Final  . LVOT Peak VTI 07/01/2024 19.500  cm Final  . LVOT Mean Vel 07/01/2024 55.800  cm/s Final  . LVOT Peak Velocity 07/01/2024 0.849  m/s Final  . LVOT Peak Gradient 07/01/2024 3.000  mmHg Final  . LVPWd 07/01/2024 1.100  0.60 - 1.12 cm Final  . LVPWD 07/01/2024 1.100  cm Final  . LV Peak Diastolic Tissue Velocity * 07/01/2024 12.200  cm/s Final  . MV E' Lateral Tissue Vel 07/01/2024 12.200  cm/s Final  . LV Peak Diastolic Tissue Velocity * 07/01/2024 8.050  cm/s Final  . MV E' Tissue Velocity Lateral 07/01/2024 8.050  cm/s Final  . Global Strain 07/01/2024 -16.300  % Final  . E/E' Lateral Ratio 07/01/2024 5.300  no units Final  . E/E' Ratio 07/01/2024 8.00  no units Final  . LVFS 07/01/2024 23.400  %  Final  . Interventricular Septum/Left Ventr* 07/01/2024 0.727  no units Final  .  LVOT Area 07/01/2024 2.84  cm2 Final  . IVC 07/01/2024 1.90  cm Final  . MR Vel 07/01/2024 336.000  cm/s Final  . MR Vel 07/01/2024 339.000  cm/s Final  . MR Vel 07/01/2024 255.000  cm/s Final  . Mitral Regurgitant Peak Velocity 07/01/2024 310.000  cm/s Final  . MV DT 07/01/2024 275.000  ms Final  . E wave deceleration time 07/01/2024 275.000  msec Final  . MV A Velocity 07/01/2024 65.000  cm/s Final  . MV Peak A Vel 07/01/2024 0.650  m/s Final  . MV E Velocity 07/01/2024 64.500  cm/s Final  . MV Peak E Vel 07/01/2024 64.500  cm/s Final  . MV E/A 07/01/2024 1.000  no units Final  . PR End Diastolic Velocity 07/01/2024 77.600  cm/s Final  . PV PEAK VELOCITY 07/01/2024 77.600  cm/s Final  . PV peak gradient 07/01/2024 2.000  mmHg Final  . TAPSE 07/01/2024 1.510  cm Final  . TR Peak Velocity 07/01/2024 2.650  m/s Final  . TR Peak Gradient 07/01/2024 28.000  mmHg Final  . LV Mass 07/01/2024 153.000  g Final  . Patient Height 07/01/2024 65   Final  . Patient Weight 07/01/2024 2,991.2   Final  . Blood Pressure Monitoring 07/01/2024 145/82   Final  . Pulse 07/01/2024 61   Final  . Resp Care Set Rate Venous 07/01/2024 18   Final  . Calculated BMI 07/01/2024 31.1   Final  . BSA 07/01/2024 1.97  m2 Final  . Pulse Ox 07/01/2024 96   Final  . ZLVIDS 07/01/2024 0.60   Final  . ZIVSD 07/01/2024 -1.03   Final  . ZLVPWD 07/01/2024 1.54   Final  . ZLVIDD 07/01/2024 -0.80   Final  . Glucose, POC 07/01/2024 133 (H)  70 - 99 mg/dL Final  . OPERATOR ID [id number]   Final  . INSTRUMENT ID 07/01/2024 [id number]   Final  . Glucose, POC 07/01/2024 177 (H)  70 - 99 mg/dL Final  . OPERATOR ID [id number]   Final  . INSTRUMENT ID 07/01/2024 [id number]   Final  . Glucose, POC 07/02/2024 151 (H)  70 - 99 mg/dL Final  . OPERATOR ID [id number]   Final  . INSTRUMENT ID 07/02/2024 [id number]   Final   . Glucose, POC 07/02/2024 157 (H)  70 - 99 mg/dL Final  . OPERATOR ID [id number]   Final  . INSTRUMENT ID 07/02/2024 [id number]   Final  Admission on 05/22/2024, Discharged on 05/22/2024  Component Date Value Ref Range Status  . WBC 05/22/2024 10.3  4.0 - 10.5 thou/mcL Final  . RBC 05/22/2024 4.81  4.63 - 6.08 million/mcL Final  . HGB 05/22/2024 14.1  13.7 - 17.5 gm/dL Final  . HCT 94/75/7974 44.7  40.1 - 51.0 % Final  . MCV 05/22/2024 92.9 (H)  79.0 - 92.2 fL Final  . MCH 05/22/2024 29.3  25.7 - 32.2 pg Final  . MCHC 05/22/2024 31.5 (L)  32.3 - 36.5 gm/dL Final  . Plt Ct 94/75/7974 228  150 - 400 thou/mcL Final  . RDW SD 05/22/2024 53.1 (H)  35.1 - 46.3 fL Final  . MPV 05/22/2024 9.0 (L)  9.4 - 12.4 fL Final  . NRBC% 05/22/2024 0.0  0.0 - 0.2 /100WBC Final  . Absolute NRBC Count 05/22/2024 0.00  0.00 - 0.01 thou/mcL Final  . NEUTROPHIL % 05/22/2024 55.8  % Final  . LYMPHOCYTE % 05/22/2024 23.0  % Final  . MONOCYTE %  05/22/2024 7.8  % Final  . Eosinophil % 05/22/2024 8.7  % Final  . BASOPHIL % 05/22/2024 1.0  % Final  . IG% 05/22/2024 3.7  % Final  . ABSOLUTE NEUTROPHIL COUNT 05/22/2024 5.74  1.50 - 7.50 thou/mcL Final  . ABSOLUTE LYMPHOCYTE COUNT 05/22/2024 2.37  1.00 - 4.50 thou/mcL Final  . Absolute Monocyte Count 05/22/2024 0.80  0.10 - 0.80 thou/mcL Final  . Absolute Eosinophil Count 05/22/2024 0.90 (H)  0.00 - 0.50 thou/mcL Final  . Absolute Basophil Count 05/22/2024 0.10  0.00 - 0.20 thou/mcL Final  . Absolute Immature Granulocyte Count 05/22/2024 0.38 (H)  0.00 - 0.03 thou/mcL  Final  . Na 05/22/2024 137  136 - 146 mmol/L Final  . Potassium 05/22/2024 4.5  3.7 - 5.4 mmol/L Final  . Cl 05/22/2024 99  97 - 108 mmol/L Final  . CO2 05/22/2024 29  20 - 32 mmol/L Final  . AGAP 05/22/2024 9  7 - 16 mmol/L Final  . Glucose 05/22/2024 173 (H)  65 - 99 mg/dL Final  . BUN 94/75/7974 26  8 - 27 mg/dL Final  . Creatinine 94/75/7974 1.32 (H)  0.76 - 1.27 mg/dL Final  . Ca  94/75/7974 9.0  8.6 - 10.2 mg/dL Final  . BUN/CREAT RATIO 05/22/2024 19.7  11.0 - 26.0 Final  . eGFR 05/22/2024 54 (L)  >=60 mL/min/1.32m2 Final  . PT 05/22/2024 12.7  11.8 - 14.3 second(s) Final  . INR 05/22/2024 1.0  See Therapeutic ranges Final  . PTT 05/22/2024 31  22 - 35 second(s) Final  Office Visit on 04/07/2024  Component Date Value Ref Range Status  . Hemoglobin A1c 04/07/2024 7.3 (A)  4.8 - 5.6 % Final  . Amphetamine, Urine 04/07/2024 Negative  Negative Final  . Barbiturates 04/07/2024 Negative  Negative Final  . Buprenorphine, Urine 04/07/2024 Negative  Negative Final  . Benzodiazepines 04/07/2024 Negative  Negative Final  . Cocaine, Urine 04/07/2024 Negative  Negative Final  . MDMA, Urine 04/07/2024 Negative  Negative Final  . Methamphetamine, Urine 04/07/2024 Negative  Negative Final  . Opiates (MOP), Urine 04/07/2024 Negative  Negative Final  . Methadone, Urine 04/07/2024 Negative  Negative Final  . Oxycodone , Urine 04/07/2024 Negative  Negative Final  . Phencyclidine, Urine 04/07/2024 Negative  Negative Final  . Marijuana, Urine 04/07/2024 Negative  Negative Final  . Drug Screen Internal Control  04/07/2024 Acceptable   Final  . Glucose 04/07/2024 173 (H)  70 - 99 mg/dL Final  . BUN 95/90/7974 25  8 - 27 mg/dL Final  . Creatinine 95/90/7974 1.51 (H)  0.76 - 1.27 mg/dL Final  . eGFR 95/90/7974 46 (L)  >59 mL/min/1.73 Final  . BUN/Creatinine Ratio 04/07/2024 17  10 - 24 Final  . Sodium 04/07/2024 140  134 - 144 mmol/L Final  . Potassium 04/07/2024 4.8  3.5 - 5.2 mmol/L Final  . Chloride 04/07/2024 101  96 - 106 mmol/L Final  . CO2 04/07/2024 22  20 - 29 mmol/L Final  . CALCIUM  04/07/2024 9.3  8.6 - 10.2 mg/dL Final  . Total Protein 04/07/2024 6.6  6.0 - 8.5 g/dL Final  . Albumin, Serum 04/07/2024 4.5  3.7 - 4.7 g/dL Final  . Globulin, Total 04/07/2024 2.1  1.5 - 4.5 g/dL Final  . Total Bilirubin 04/07/2024 0.4  0.0 - 1.2 mg/dL Final  . Alkaline Phosphatase 04/07/2024  79  44 - 121 IU/L Final  . AST 04/07/2024 17  0 - 40 IU/L Final  . ALT (SGPT) 04/07/2024  11  0 - 44 IU/L Final  . A/C RATIO UR, POC 04/07/2024 17.8  0.0 - 29.9 mg/g Corrected  . Albumin 04/07/2024 25.3  0.0 - 29.9 mg/L Corrected  . CREATININE UR, POC 04/07/2024 141.9  34.0 - 147.0 mg/dL Corrected  Admission on 03/22/2024, Discharged on 03/23/2024  Component Date Value Ref Range Status  . WBC 03/22/2024 8.5  4.0 - 10.5 thou/mcL Final  . RBC 03/22/2024 4.98  4.63 - 6.08 million/mcL Final  . HGB 03/22/2024 14.6  13.7 - 17.5 gm/dL Final  . HCT 96/75/7974 46.0  40.1 - 51.0 % Final  . MCV 03/22/2024 92.4 (H)  79.0 - 92.2 fL Final  . MCH 03/22/2024 29.3  25.7 - 32.2 pg Final  . MCHC 03/22/2024 31.7 (L)  32.3 - 36.5 gm/dL Final  . Plt Ct 96/75/7974 220  150 - 400 thou/mcL Final  . RDW SD 03/22/2024 45.2  35.1 - 46.3 fL Final  . MPV 03/22/2024 9.6  9.4 - 12.4 fL Final  . NRBC% 03/22/2024 0.0  0.0 - 0.2 /100WBC Final  . Absolute NRBC Count 03/22/2024 0.00  0.00 - 0.01 thou/mcL Final  . NEUTROPHIL % 03/22/2024 52.7  % Final  . LYMPHOCYTE % 03/22/2024 30.0  % Final  . MONOCYTE % 03/22/2024 11.1  % Final  . Eosinophil % 03/22/2024 4.4  % Final  . BASOPHIL % 03/22/2024 1.2  % Final  . IG% 03/22/2024 0.6  % Final  . ABSOLUTE NEUTROPHIL COUNT 03/22/2024 4.49  1.50 - 7.50 thou/mcL Final  . ABSOLUTE LYMPHOCYTE COUNT 03/22/2024 2.55  1.00 - 4.50 thou/mcL Final  . Absolute Monocyte Count 03/22/2024 0.94 (H)  0.10 - 0.80 thou/mcL Final  . Absolute Eosinophil Count 03/22/2024 0.37  0.00 - 0.50 thou/mcL Final  . Absolute Basophil Count 03/22/2024 0.10  0.00 - 0.20 thou/mcL Final  . Absolute Immature Granulocyte Count 03/22/2024 0.05 (H)  0.00 - 0.03 thou/mcL  Final  . Na 03/22/2024 142  136 - 146 mmol/L Final  . Potassium 03/22/2024 4.5  3.7 - 5.4 mmol/L Final  . Cl 03/22/2024 105  97 - 108 mmol/L Final  . CO2 03/22/2024 22  20 - 32 mmol/L Final  . AGAP 03/22/2024 15  7 - 16 mmol/L Final  . Glucose  03/22/2024 119 (H)  65 - 99 mg/dL Final  . BUN 96/75/7974 20  8 - 27 mg/dL Final  . Creatinine 96/75/7974 1.02  0.76 - 1.27 mg/dL Final  . Ca 96/75/7974 9.3  8.6 - 10.2 mg/dL Final  . ALK PHOS 96/75/7974 83  25 - 160 U/L Final  . T Bili 03/22/2024 0.3  0.0 - 1.2 mg/dL Final  . Total Protein 03/22/2024 6.7  6.0 - 8.5 gm/dL Final  . Alb 96/75/7974 4.3  3.5 - 4.7 gm/dL Final  . GLOBULIN 96/75/7974 2.4  1.5 - 4.5 gm/dL Final  . ALBUMIN/GLOBULIN RATIO 03/22/2024 1.8  1.1 - 2.5 Final  . BUN/CREAT RATIO 03/22/2024 19.6  11.0 - 26.0 Final  . ALT 03/22/2024 12  0 - 55 U/L Final  . AST 03/22/2024 14  0 - 40 U/L Final  . eGFR 03/22/2024 74  >=60 mL/min/1.7m2 Final  . Urine Color 03/22/2024 Yellow  Yellow  Final  . Urine Clarity 03/22/2024 Clear  Clear Final  . Urine Specific Gravity 03/22/2024 1.012  1.005 - 1.030 Final  . Urine pH 03/22/2024 5.0  5 to 9 Final  . Urine Protein - Dipstick 03/22/2024 Negative  Negative mg/dl Final  .  Urine Glucose 03/22/2024 Negative  Negative mg/dL Final  . Urine Ketones 03/22/2024 Negative  Negative mg/dl Final  . Urine Bilirubin 03/22/2024 Negative  Negative mg/dL Final  . Urine Blood 96/75/7974 Negative  Negative mg/dL Final  . Urine Nitrite 03/22/2024 Negative  Negative Final  . Urine Urobilinogen 03/22/2024 <2  <2 mg/dl Final  . Urine Leukocyte Esterase 03/22/2024 Negative  Negative Leu/mcL Final  . UA Microscopic 03/22/2024 No Micro  No Micro Final  . TnT-Gen5 (0hr) 03/22/2024 31 (H)  <22 ng/L Final  . Mg 03/22/2024 2.3  1.6 - 2.6 mg/dL Final  . NT-ProBNP 96/75/7974 218  <=1,799 pg/mL Final  . TnT-Gen5 (1hr) 03/22/2024 29 (H)  <22 ng/L Final  . Delta 1 Hour 03/22/2024 -2  <5 ng/L Final  . TnT-Gen5 (3hr) 03/22/2024 35 (H)  <22 ng/L Final  . Delta 3 Hour 03/22/2024 4  <7 ng/L Final  . WBC 03/23/2024 8.7  4.0 - 10.5 thou/mcL Final  . RBC 03/23/2024 4.80  4.63 - 6.08 million/mcL Final  . HGB 03/23/2024 14.7  13.7 - 17.5 gm/dL Final  . HCT 96/74/7974 44.2   40.1 - 51.0 % Final  . MCV 03/23/2024 92.1  79.0 - 92.2 fL Final  . MCH 03/23/2024 30.6  25.7 - 32.2 pg Final  . MCHC 03/23/2024 33.3  32.3 - 36.5 gm/dL Final  . Plt Ct 96/74/7974 224  150 - 400 thou/mcL Final  . RDW SD 03/23/2024 45.5  35.1 - 46.3 fL Final  . MPV 03/23/2024 9.8  9.4 - 12.4 fL Final  . NRBC% 03/23/2024 0.0  0.0 - 0.2 /100WBC Final  . Absolute NRBC Count 03/23/2024 0.00  0.00 - 0.01 thou/mcL Final  . Na 03/23/2024 140  136 - 146 mmol/L Final  . Potassium 03/23/2024 4.5  3.7 - 5.4 mmol/L Final  . Cl 03/23/2024 104  97 - 108 mmol/L Final  . CO2 03/23/2024 26  20 - 32 mmol/L Final  . AGAP 03/23/2024 10  7 - 16 mmol/L Final  . Glucose 03/23/2024 160 (H)  65 - 99 mg/dL Final  . BUN 96/74/7974 16  8 - 27 mg/dL Final  . Creatinine 96/74/7974 0.92  0.76 - 1.27 mg/dL Final  . Ca 96/74/7974 8.8  8.6 - 10.2 mg/dL Final  . BUN/CREAT RATIO 03/23/2024 17.4  11.0 - 26.0 Final  . eGFR 03/23/2024 84  >=60 mL/min/1.70m2 Final  . Mg 03/23/2024 2.1  1.6 - 2.6 mg/dL Final  . TSH 96/74/7974 1.530  0.450 - 4.500 uIU/mL Final  . CHOLESTEROL TOTAL 03/23/2024 155  100 - 199 mg/dL Final  . Trig 96/74/7974 157 (H)  0 - 149 mg/dL Final  . LDL 96/74/7974 82  0 - 99 mg/dL Final  . VLDL 96/74/7974 31  5 - 40 mg/dl Final  . CHOL/HDL 96/74/7974 4  0 - 5 Final  . HDL 03/23/2024 42  >=39 mg/dL Final  . LV Length Dias (A4C) 03/23/2024 6.440  cm Final  . LV Area Dias (A4C) 03/23/2024 20.300  cm2 Final  . LV Length Sys (A4C) 03/23/2024 5.330  cm Final  . LV Systolic Volume (BP) 03/23/2024 22.100  mL Final  . A4C EF 03/23/2024 61  % Final  . RA Major 03/23/2024 6.140  cm Final  . RA Volume 03/23/2024 56.000  mL Final  . RA Pressure 03/23/2024 3.000  mmHg Final  . MV DT 03/23/2024 261.000  ms Final  . E wave deceleration time 03/23/2024 261.000  msec Final  .  MV A Velocity 03/23/2024 74.400  cm/s Final  . MV Peak A Vel 03/23/2024 0.744  m/s Final  . MV E Velocity 03/23/2024 79.100  cm/s Final  . MV  Peak E Vel 03/23/2024 79.100  cm/s Final  . MV E/A 03/23/2024 1.100  no units Final  . MV E' Tissue Velocity Medial 03/23/2024 214.000  cm/s Final  . RVSP 03/23/2024 40.000  mmHg Final  . TR Peak Velocity 03/23/2024 3.040  m/s Final  . TR Peak Gradient 03/23/2024 37.000  mmHg Final  . Patient Height 03/23/2024 65   Final  . Patient Weight 03/23/2024 2,960   Final  . Blood Pressure Monitoring 03/23/2024 135/66   Final  . Pulse 03/23/2024 65   Final  . Resp Care Set Rate Venous 03/23/2024 19   Final  . Calculated BMI 03/23/2024 30.8   Final  . BSA 03/23/2024 1.96  m2 Final  . Pulse Ox 03/23/2024 95   Final  . LV Diastolic Volume (BP) 03/23/2024 56.1  mL Final  . Aortic root 03/23/2024 3.30  cm Final  Admission on 03/15/2024, Discharged on 03/16/2024  Component Date Value Ref Range Status  . WBC 03/15/2024 9.4  4.0 - 10.5 thou/mcL Final  . RBC 03/15/2024 4.95  4.63 - 6.08 million/mcL Final  . HGB 03/15/2024 14.7  13.7 - 17.5 gm/dL Final  . HCT 96/82/7974 45.4  40.1 - 51.0 % Final  . MCV 03/15/2024 91.7  79.0 - 92.2 fL Final  . MCH 03/15/2024 29.7  25.7 - 32.2 pg Final  . MCHC 03/15/2024 32.4  32.3 - 36.5 gm/dL Final  . Plt Ct 96/82/7974 218  150 - 400 thou/mcL Final  . RDW SD 03/15/2024 46.2  35.1 - 46.3 fL Final  . MPV 03/15/2024 9.9  9.4 - 12.4 fL Final  . NRBC% 03/15/2024 0.0  0.0 - 0.2 /100WBC Final  . Absolute NRBC Count 03/15/2024 0.00  0.00 - 0.01 thou/mcL Final  . NEUTROPHIL % 03/15/2024 54.6  % Final  . LYMPHOCYTE % 03/15/2024 27.6  % Final  . MONOCYTE % 03/15/2024 10.4  % Final  . Eosinophil % 03/15/2024 5.2  % Final  . BASOPHIL % 03/15/2024 1.1  % Final  . IG% 03/15/2024 1.1  % Final  . ABSOLUTE NEUTROPHIL COUNT 03/15/2024 5.16  1.50 - 7.50 thou/mcL Final  . ABSOLUTE LYMPHOCYTE COUNT 03/15/2024 2.61  1.00 - 4.50 thou/mcL Final  . Absolute Monocyte Count 03/15/2024 0.98 (H)  0.10 - 0.80 thou/mcL Final  . Absolute Eosinophil Count 03/15/2024 0.49  0.00 - 0.50 thou/mcL  Final  . Absolute Basophil Count 03/15/2024 0.10  0.00 - 0.20 thou/mcL Final  . Absolute Immature Granulocyte Count 03/15/2024 0.10 (H)  0.00 - 0.03 thou/mcL  Final  . Na 03/15/2024 142  136 - 146 mmol/L Final  . Potassium 03/15/2024 4.2  3.7 - 5.4 mmol/L Final  . Cl 03/15/2024 104  97 - 108 mmol/L Final  . CO2 03/15/2024 27  20 - 32 mmol/L Final  . AGAP 03/15/2024 11  7 - 16 mmol/L Final  . Glucose 03/15/2024 118 (H)  65 - 99 mg/dL Final  . BUN 96/82/7974 19  8 - 27 mg/dL Final  . Creatinine 96/82/7974 1.10  0.76 - 1.27 mg/dL Final  . Ca 96/82/7974 9.2  8.6 - 10.2 mg/dL Final  . ALK PHOS 96/82/7974 85  25 - 160 U/L Final  . T Bili 03/15/2024 0.4  0.0 - 1.2 mg/dL Final  . Total Protein 03/15/2024  7.2  6.0 - 8.5 gm/dL Final  . Alb 96/82/7974 4.4  3.5 - 4.7 gm/dL Final  . GLOBULIN 96/82/7974 2.8  1.5 - 4.5 gm/dL Final  . ALBUMIN/GLOBULIN RATIO 03/15/2024 1.6  1.1 - 2.5 Final  . BUN/CREAT RATIO 03/15/2024 17.3  11.0 - 26.0 Final  . ALT 03/15/2024 17  0 - 55 U/L Final  . AST 03/15/2024 20  0 - 40 U/L Final  . eGFR 03/15/2024 67  >=60 mL/min/1.48m2 Final  . TnT-Gen5 (0hr) 03/15/2024 152 (H)  <22 ng/L Final  . Mg 03/15/2024 2.2  1.6 - 2.6 mg/dL Final  . NT-ProBNP 96/82/7974 245  <=1,799 pg/mL Final  . PT 03/15/2024 12.7  11.8 - 14.3 second(s) Final  . INR 03/15/2024 0.9  See Therapeutic ranges Final  . PTT 03/15/2024 33  22 - 35 second(s) Final  . TnT-Gen5 (1hr) 03/15/2024 146 (H)  <22 ng/L Final  . Delta 1 Hour 03/15/2024 -6  <5 ng/L Final  . TnT-Gen5 (3hr) 03/15/2024 144 (H)  <22 ng/L Final  . Delta 3 Hour 03/15/2024 -8  <7 ng/L Final  . WBC 03/16/2024 8.0  4.0 - 10.5 thou/mcL Final  . RBC 03/16/2024 4.65  4.63 - 6.08 million/mcL Final  . HGB 03/16/2024 14.2  13.7 - 17.5 gm/dL Final  . HCT 96/81/7974 43.6  40.1 - 51.0 % Final  . MCV 03/16/2024 93.8 (H)  79.0 - 92.2 fL Final  . MCH 03/16/2024 30.5  25.7 - 32.2 pg Final  . MCHC 03/16/2024 32.6  32.3 - 36.5 gm/dL Final  . Plt Ct  96/81/7974 219  150 - 400 thou/mcL Final  . RDW SD 03/16/2024 46.4 (H)  35.1 - 46.3 fL Final  . MPV 03/16/2024 9.8  9.4 - 12.4 fL Final  . NRBC% 03/16/2024 0.0  0.0 - 0.2 /100WBC Final  . Absolute NRBC Count 03/16/2024 0.00  0.00 - 0.01 thou/mcL Final  . Na 03/16/2024 141  136 - 146 mmol/L Final  . Potassium 03/16/2024 4.1  3.7 - 5.4 mmol/L Final  . Cl 03/16/2024 104  97 - 108 mmol/L Final  . CO2 03/16/2024 27  20 - 32 mmol/L Final  . AGAP 03/16/2024 10  7 - 16 mmol/L Final  . Glucose 03/16/2024 133 (H)  65 - 99 mg/dL Final  . BUN 96/81/7974 19  8 - 27 mg/dL Final  . Creatinine 96/81/7974 1.30 (H)  0.76 - 1.27 mg/dL Final  . Ca 96/81/7974 8.8  8.6 - 10.2 mg/dL Final  . ALK PHOS 96/81/7974 75  25 - 160 U/L Final  . T Bili 03/16/2024 0.3  0.0 - 1.2 mg/dL Final  . Total Protein 03/16/2024 6.2  6.0 - 8.5 gm/dL Final  . Alb 96/81/7974 4.0  3.5 - 4.7 gm/dL Final  . GLOBULIN 96/81/7974 2.2  1.5 - 4.5 gm/dL Final  . ALBUMIN/GLOBULIN RATIO 03/16/2024 1.8  1.1 - 2.5 Final  . BUN/CREAT RATIO 03/16/2024 14.6  11.0 - 26.0 Final  . ALT 03/16/2024 13  0 - 55 U/L Final  . AST 03/16/2024 14  0 - 40 U/L Final  . eGFR 03/16/2024 55 (L)  >=60 mL/min/1.51m2 Final  . Mg 03/16/2024 2.2  1.6 - 2.6 mg/dL Final  No results displayed because visit has over 200 results.    Admission on 03/10/2024, Discharged on 03/10/2024  Component Date Value Ref Range Status  . WBC 03/10/2024 7.6  4.0 - 10.5 thou/mcL Final  . RBC 03/10/2024 4.96  4.63 - 6.08 million/mcL Final  .  HGB 03/10/2024 14.6  13.7 - 17.5 gm/dL Final  . HCT 96/87/7974 46.4  40.1 - 51.0 % Final  . MCV 03/10/2024 93.5 (H)  79.0 - 92.2 fL Final  . MCH 03/10/2024 29.4  25.7 - 32.2 pg Final  . MCHC 03/10/2024 31.5 (L)  32.3 - 36.5 gm/dL Final  . Plt Ct 96/87/7974 224  150 - 400 thou/mcL Final  . RDW SD 03/10/2024 46.6 (H)  35.1 - 46.3 fL Final  . MPV 03/10/2024 9.8  9.4 - 12.4 fL Final  . NRBC% 03/10/2024 0.0  0.0 - 0.2 /100WBC Final  . Absolute NRBC  Count 03/10/2024 0.00  0.00 - 0.01 thou/mcL Final  . NEUTROPHIL % 03/10/2024 52.8  % Final  . LYMPHOCYTE % 03/10/2024 28.7  % Final  . MONOCYTE % 03/10/2024 11.1  % Final  . Eosinophil % 03/10/2024 5.4  % Final  . BASOPHIL % 03/10/2024 1.2  % Final  . IG% 03/10/2024 0.8  % Final  . ABSOLUTE NEUTROPHIL COUNT 03/10/2024 3.98  1.50 - 7.50 thou/mcL Final  . ABSOLUTE LYMPHOCYTE COUNT 03/10/2024 2.17  1.00 - 4.50 thou/mcL Final  . Absolute Monocyte Count 03/10/2024 0.84 (H)  0.10 - 0.80 thou/mcL Final  . Absolute Eosinophil Count 03/10/2024 0.41  0.00 - 0.50 thou/mcL Final  . Absolute Basophil Count 03/10/2024 0.09  0.00 - 0.20 thou/mcL Final  . Absolute Immature Granulocyte Count 03/10/2024 0.06 (H)  0.00 - 0.03 thou/mcL  Final  . Na 03/10/2024 140  136 - 146 mmol/L Final  . Potassium 03/10/2024 4.3  3.7 - 5.4 mmol/L Final  . Cl 03/10/2024 105  97 - 108 mmol/L Final  . CO2 03/10/2024 25  20 - 32 mmol/L Final  . AGAP 03/10/2024 10  7 - 16 mmol/L Final  . Glucose 03/10/2024 140 (H)  65 - 99 mg/dL Final  . BUN 96/87/7974 21  8 - 27 mg/dL Final  . Creatinine 96/87/7974 1.10  0.76 - 1.27 mg/dL Final  . Ca 96/87/7974 9.0  8.6 - 10.2 mg/dL Final  . ALK PHOS 96/87/7974 72  25 - 160 U/L Final  . T Bili 03/10/2024 0.3  0.0 - 1.2 mg/dL Final  . Total Protein 03/10/2024 6.3  6.0 - 8.5 gm/dL Final  . Alb 96/87/7974 4.1  3.5 - 4.7 gm/dL Final  . GLOBULIN 96/87/7974 2.2  1.5 - 4.5 gm/dL Final  . ALBUMIN/GLOBULIN RATIO 03/10/2024 1.9  1.1 - 2.5 Final  . BUN/CREAT RATIO 03/10/2024 19.1  11.0 - 26.0 Final  . ALT 03/10/2024 10  0 - 55 U/L Final  . AST 03/10/2024 12  0 - 40 U/L Final  . eGFR 03/10/2024 67  >=60 mL/min/1.21m2 Final  . Lipase 03/10/2024 39  0 - 59 U/L Final  . Urine Color 03/10/2024 Yellow  Yellow  Final  . Urine Clarity 03/10/2024 Clear  Clear Final  . Urine Specific Gravity 03/10/2024 1.022  1.005 - 1.030 Final  . Urine pH 03/10/2024 5.0  5 to 9 Final  . Urine Protein - Dipstick  03/10/2024 Negative  Negative mg/dl Final  . Urine Glucose 03/10/2024 Negative  Negative mg/dL Final  . Urine Ketones 03/10/2024 Negative  Negative mg/dl Final  . Urine Bilirubin 03/10/2024 Negative  Negative mg/dL Final  . Urine Blood 96/87/7974 Negative  Negative mg/dL Final  . Urine Urobilinogen 03/10/2024 <2  <2 mg/dl Final  . Urine Nitrite 03/10/2024 Negative  Negative Final  . Urine Leukocyte Esterase 03/10/2024 Negative  Negative Leu/mcL Final  .  UA Microscopic 03/10/2024 No Micro  No Micro Final  . TnT-Gen5 (0hr) 03/10/2024 22 (H)  <22 ng/L Final  . NT-ProBNP 03/10/2024 180  <=1,799 pg/mL Final  . CK 03/10/2024 104  24 - 204 U/L Final  . Mg 03/10/2024 2.1  1.6 - 2.6 mg/dL Final  . Flu A 96/87/7974 Negative  Negative Final  . Flu B 03/10/2024 Negative  Negative Final  . RSV PCR 03/10/2024 Negative  Negative Final  . SARS-COV-2 03/10/2024 Not Detected  Not Detected Final  . TnT-Gen5 (1hr) 03/10/2024 24 (H)  <22 ng/L Final  . Delta 1 Hour 03/10/2024 2  <5 ng/L Final  . TnT-Gen5 (3hr) 03/10/2024 28 (H)  <22 ng/L Final  . Delta 3 Hour 03/10/2024 6  <7 ng/L Final  There may be more visits with results that are not included.      Assessment   83 y.o. male with PMHx as above who presents for evaluation of chronic headaches and migraines have been ongoing for approximately 5 years.  Patient has continued to have approximately 20 headache days per month.  Headaches are generally located in the right frontal region with some in the occipital region.  Headaches are described as sharp and will reach a 7-8/10 in severity.  Migraines lasted for 24 hours and are associate with nausea and vomiting as well as photo phonophobia.  Patient's neurological examination is at his baseline without any focal/lateralizing deficits.  He is currently utilizing Fioricet and is likely having rebound headaches.  We did have a long discussion on appropriately treating the headaches and how to avoid rebound  headaches.  The patient endorsed understanding and will decrease his Fioricet use.  I have recommended that the patient try [place] for abortive therapy of his headaches and migraines.  I have contraindicated the patient from taking triptan class medications as he has a history of CVA and significant cardiac disease.  For prophylactic treatment of the patient's headaches and migraines, I recommend he try Botox as he has tried and failed multiple generic medications at this time.  While we are waiting on the Botox to get approved, we will begin him on low-dose topiramate to see if this is able to offer him some relief.  Patient also has a significant amount of cervical pathology and could be having some cervicogenic component to his headaches and migraines as well.  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  Patient's exam was not concerning, however, he does report some imbalance.  The imbalance could deafly be coming from the spinal cord compression.  Patient does report that he has been seen by neurosurgery in the past for his cervical spine.  We could consider a course of physical therapy for his cervical spine which could potentially help with his headaches.  We could also consider trigger point injections, specially if the Botox is denied.  Plan   1. Chronic migraine without aura without status migrainosus, not intractable (Primary) - topiramate (TOPAMAX) 25 MG tablet; 1 tab by mouth at bedtime  Dispense: 30 tablet; Refill: 3 Discussed risk/benefits Monitor for toxicity - botulinum toxin type A (BOTOX) 155 units injection; Inject one hundred fifty five Units as directed once for 1 dose. Provider to inject 155 units into the muscle every 3 months per PREEMPT protocol for chronic migraine.    2. Frequent headaches - topiramate (TOPAMAX) 25 MG tablet; 1 tab by mouth at bedtime  Dispense: 30  tablet; Refill: 3  3. Migraine  without aura and without status migrainosus, not intractable - ubrogepant (UBRELVY) 100 mg tablet; Take one hundred mg by mouth daily as needed.  Dispense: 16 tablet; Refill: 3  Placing patient on a triptan 5-HT1 receptor agonist is contraindicated due to its vasoconstrictive properties in the setting of a history of CVA and significant cardiac disease as this could cause patient to have a cerebral vascular accident   - Consider trigger point injections - Consider physical therapy - Limit Fioricet use, rebound headaches discussed with patient  Likely exacerbated by poor sleep quality Continue Tylenol , ibuprofen for residual headache  Discussed the importance of improved diet Recommended cardiovascular exercise for headache prevention Stay well hydrated Maintain headache calendar Discussed avoiding triggers and decreasing use of caffeine  and tobacco Advised Pt to monitor for worrisome signs and symptoms, including neurosensory/motor deficits, difficulty articulating, visual disturbances, dizziness, neck stiffness, decreased cognition, worsening pain, and N/V/F/C.  Follow up with primary provider.  Present to ER with new or worsening symptoms.  Risks, benefits, and alternatives of the medications and treatment plan prescribed today were discussed, and patient expressed understanding and agreement with the plan.  All new prescription medications and changes in current prescription dosages were discussed with the patient, including patient education, medication name, use, dosage, potential side effects, drug interactions, consequences of not using/taking and special instructions. Patient expressed understanding. No barriers to adherence.  Follow up in about 6 weeks (around 11/18/2024).  Electronically Signed: Fonda FREDRIK Minus, DNP, AGNP-C Neurology Nurse Practitioner  [facility] [place] 10/07/2024 5:23 PM  *This note was dictated with voice recognition software.  Inadvertently, similar sounding words can, sometimes, get transcribed incorrectly        [1] Social History Socioeconomic History  . Marital status: Married    Spouse name: Angel Khan  . Number of children: 3  Occupational History  . Occupation: retired/disabled  Tobacco Use  . Smoking status: Former    Current packs/day: 0.00    Average packs/day: 1.5 packs/day for 29.0 years (43.5 ttl pk-yrs)    Types: Cigarettes    Start date: 88    Quit date: 1986    Years since quitting: 39.7    Passive exposure: Past  . Smokeless tobacco: Never  Vaping Use  . Vaping status: Never Used  Substance and Sexual Activity  . Alcohol use: No  . Drug use: No  . Sexual activity: Yes    Partners: Female  Social History Narrative   Denied - Alcohol   Denied - Exercise Habits

## 2024-11-10 ENCOUNTER — Ambulatory Visit: Admitting: Neurology

## 2024-11-22 ENCOUNTER — Ambulatory Visit (HOSPITAL_BASED_OUTPATIENT_CLINIC_OR_DEPARTMENT_OTHER): Admitting: Cardiovascular Disease
